# Patient Record
Sex: Female | Born: 1960
Health system: Southern US, Community
[De-identification: ages and names within clinical notes are randomized; demographics above are authoritative.]

## PROBLEM LIST (undated history)

## (undated) DIAGNOSIS — I10 Essential (primary) hypertension: Secondary | ICD-10-CM

## (undated) DIAGNOSIS — Z96652 Presence of left artificial knee joint: Secondary | ICD-10-CM

## (undated) DIAGNOSIS — Z8489 Family history of other specified conditions: Secondary | ICD-10-CM

## (undated) DIAGNOSIS — R011 Cardiac murmur, unspecified: Secondary | ICD-10-CM

## (undated) DIAGNOSIS — E7800 Pure hypercholesterolemia, unspecified: Secondary | ICD-10-CM

## (undated) DIAGNOSIS — Z923 Personal history of irradiation: Secondary | ICD-10-CM

## (undated) DIAGNOSIS — E039 Hypothyroidism, unspecified: Secondary | ICD-10-CM

## (undated) DIAGNOSIS — F419 Anxiety disorder, unspecified: Secondary | ICD-10-CM

## (undated) DIAGNOSIS — C801 Malignant (primary) neoplasm, unspecified: Secondary | ICD-10-CM

## (undated) DIAGNOSIS — M1712 Unilateral primary osteoarthritis, left knee: Secondary | ICD-10-CM

## (undated) DIAGNOSIS — J189 Pneumonia, unspecified organism: Secondary | ICD-10-CM

## (undated) DIAGNOSIS — Z87442 Personal history of urinary calculi: Secondary | ICD-10-CM

## (undated) HISTORY — PX: BONE MARROW TRANSPLANT: SHX200

## (undated) HISTORY — PX: CATARACT EXTRACTION BILATERAL W/ ANTERIOR VITRECTOMY: SHX1304

---
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
## 1988-10-30 HISTORY — PX: ECTOPIC PREGNANCY SURGERY: SHX613

## 1990-10-30 HISTORY — PX: AXILLARY LYMPH NODE DISSECTION: SHX5229

## 2002-08-03 ENCOUNTER — Encounter: Payer: Self-pay | Admitting: Emergency Medicine

## 2002-08-03 ENCOUNTER — Inpatient Hospital Stay (HOSPITAL_COMMUNITY): Admission: EM | Admit: 2002-08-03 | Discharge: 2002-08-07 | Payer: Self-pay | Admitting: Emergency Medicine

## 2002-08-04 ENCOUNTER — Encounter: Payer: Self-pay | Admitting: Internal Medicine

## 2002-08-05 ENCOUNTER — Encounter: Payer: Self-pay | Admitting: Family Medicine

## 2003-07-15 ENCOUNTER — Encounter: Payer: Self-pay | Admitting: Family Medicine

## 2003-07-15 ENCOUNTER — Ambulatory Visit (HOSPITAL_COMMUNITY): Admission: RE | Admit: 2003-07-15 | Discharge: 2003-07-15 | Payer: Self-pay | Admitting: Family Medicine

## 2005-02-20 ENCOUNTER — Emergency Department (HOSPITAL_COMMUNITY): Admission: EM | Admit: 2005-02-20 | Discharge: 2005-02-20 | Payer: Self-pay | Admitting: *Deleted

## 2005-09-13 ENCOUNTER — Ambulatory Visit (HOSPITAL_COMMUNITY): Admission: RE | Admit: 2005-09-13 | Discharge: 2005-09-13 | Payer: Self-pay | Admitting: Internal Medicine

## 2007-11-08 ENCOUNTER — Ambulatory Visit (HOSPITAL_COMMUNITY): Admission: RE | Admit: 2007-11-08 | Discharge: 2007-11-08 | Payer: Self-pay | Admitting: Internal Medicine

## 2007-11-08 ENCOUNTER — Encounter: Payer: Self-pay | Admitting: Orthopedic Surgery

## 2007-11-18 ENCOUNTER — Ambulatory Visit (HOSPITAL_COMMUNITY): Admission: RE | Admit: 2007-11-18 | Discharge: 2007-11-18 | Payer: Self-pay | Admitting: Family Medicine

## 2009-10-06 ENCOUNTER — Emergency Department (HOSPITAL_COMMUNITY): Admission: EM | Admit: 2009-10-06 | Discharge: 2009-10-06 | Payer: Self-pay | Admitting: Emergency Medicine

## 2009-10-31 ENCOUNTER — Emergency Department (HOSPITAL_COMMUNITY): Admission: EM | Admit: 2009-10-31 | Discharge: 2009-10-31 | Payer: Self-pay | Admitting: Emergency Medicine

## 2009-12-08 ENCOUNTER — Ambulatory Visit (HOSPITAL_COMMUNITY): Admission: RE | Admit: 2009-12-08 | Discharge: 2009-12-08 | Payer: Self-pay | Admitting: Family Medicine

## 2009-12-08 ENCOUNTER — Encounter: Payer: Self-pay | Admitting: Orthopedic Surgery

## 2010-03-21 ENCOUNTER — Encounter: Payer: Self-pay | Admitting: Orthopedic Surgery

## 2010-03-30 ENCOUNTER — Ambulatory Visit: Payer: Self-pay | Admitting: Orthopedic Surgery

## 2010-03-30 DIAGNOSIS — M1710 Unilateral primary osteoarthritis, unspecified knee: Secondary | ICD-10-CM

## 2010-10-29 ENCOUNTER — Emergency Department (HOSPITAL_COMMUNITY)
Admission: EM | Admit: 2010-10-29 | Discharge: 2010-10-29 | Payer: Self-pay | Source: Home / Self Care | Admitting: Emergency Medicine

## 2010-11-19 ENCOUNTER — Encounter: Payer: Self-pay | Admitting: Internal Medicine

## 2010-12-01 NOTE — Letter (Signed)
Summary: History form  History form   Imported By: Jacklynn Ganong 04/07/2010 15:10:17  _____________________________________________________________________  External Attachment:    Type:   Image     Comment:   External Document

## 2010-12-01 NOTE — Assessment & Plan Note (Signed)
Summary: EVAL/TREAT RT KNEE PAIN/XRAY RDC/REF FUSCO/BSF   Vital Signs:  Patient profile:   50 year old female Height:      63 inches Weight:      206 pounds Pulse rate:   76 / minute Resp:     16 per minute  Visit Type:  new patient Referring Provider:  Dr. Sherwood Gambler Primary Provider:  Dr. Phillips Odor  CC:  right knee pain.  History of Present Illness:   Xrays [facility] 12/08/09 right knee.  MRI L spine 2009 for review also.  50 years old female presents with "problems RIGHT knee and ".  She reports 3 injuries to the RIGHT knee one secondary to a fall at [facility] years ago and seemed to recover well from that, to twisted her knee stepping off a curb and felt acute pain.  She also reports pain after performing exercises in the Zumba  program.    Meds: Benazepril, Amlodipine, Levothyroxine.  Allergies (verified): 1)  ! 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 Cardiovascular:  Denies chest pain, palpitations, fainting, and murmurs. Respiratory:  Denies short of breath, wheezing, couch, tightness, pain on inspiration, and snoring . Gastrointestinal:  Denies heartburn, nausea, vomiting, diarrhea, constipation, and blood in your stools. Genitourinary:  Denies frequency, urgency, difficulty urinating, painful urination, flank pain, and bleeding in urine. Neurologic:  Denies numbness, tingling, unsteady gait, dizziness, tremors, and seizure. Musculoskeletal:  Denies joint pain, swelling, instability, stiffness, redness, heat, and muscle pain. Endocrine:  Denies excessive thirst, exessive urination, and heat or cold intolerance. Psychiatric:  Denies nervousness,  depression, anxiety, and hallucinations. Skin:  Denies changes in the skin, poor healing, rash, itching, and redness. HEENT:  Complains of blurred or double vision; denies eye pain, redness, and watering. Immunology:  Denies seasonal allergies, sinus problems, and allergic to bee stings. Hemoatologic:  Denies easy bleeding and brusing.  Physical Exam  Skin:  intact without lesions or rashes Cervical Nodes:  no significant adenopathy Psych:  alert and cooperative; normal mood and affect; normal attention span and concentration   Knee Exam  General:    Well-developed, well-nourished, Large body habitus; no deformities, normal grooming.  Gait:    limp noted-right.    Skin:    Intact, no scars, lesions, rashes, cafe au lait spots, or bruising.    Inspection:     No deformity, ecchymosis , but there does appear to be mild joint effusion  Palpation:      LEFT side normaltenderness R-medial joint line.    Vascular:    There was no swelling or varicose veins. The pulses and temperature are normal. There was no edema or tenderness.  Sensory:    Gross coordination and sensation were normal.    Motor:    Motor strength 5/5 bilaterally for quadriceps, hamstrings, ankle dorsiflexion, and ankle plantar flexion.    Reflexes:    Normal and symmetric patellar and Achilles reflexes bilaterally.    Knee Exam:    Right:    Inspection:  Abnormal    Palpation:  Abnormal    Left:    Inspection:  Normal    Palpation:  Normal    chest good range of motion in both knees and both  knees are stable with excellent strength.  She has negative McMurray sign.  She does have a slight loss of flexion in the RIGHT knee maybe 5 normal on the LEFT and full flexion and both with some pain with flexion of the RIGHT knee  Anterior drawer:    Right negative; Left negative Posterior drawer:    Right negative; Left negative   Impression & Recommendations:  Problem # 1:  OSTEOARTHRITIS, KNEE, RIGHT  (ICD-715.96) Assessment New  x-rays are dated December 08, 2009 RTC x-rays show severe wear of the medial compartment with near bone to bone changes  Based on her negative McMurray sign and her x-ray most likely has acute pain from osteoarthritis with possible degenerative meniscal tear  We decided to try injection first if no improvement then reassess the situation for possible scope vs. knee replacement  Verbal consent was obtained. The knee was prepped with alcohol and ethyl chloride. 1 cc of depomedrol 40mg /cc and 4 cc of lidocaine 1% was injected. there were no complications.  Orders: New Patient Level III (54098) Joint Aspirate / Injection, Large (20610) Depo- Medrol 40mg  (J1030)  Patient Instructions: 1)  You have received an injection of cortisone today. You may experience increased pain at the injection site. Apply ice pack to the area for 20 minutes every 2 hours and take 2 xtra strength tylenol every 8 hours. This increased pain will usually resolve in 24 hours. The injection will take effect in 3-10 days.  2)  continue same medications  3)  Please schedule a follow-up appointment as needed.

## 2010-12-07 ENCOUNTER — Encounter (HOSPITAL_COMMUNITY): Payer: 59

## 2010-12-07 ENCOUNTER — Other Ambulatory Visit: Payer: Self-pay | Admitting: Ophthalmology

## 2010-12-07 DIAGNOSIS — Z01812 Encounter for preprocedural laboratory examination: Secondary | ICD-10-CM | POA: Insufficient documentation

## 2010-12-07 DIAGNOSIS — Z01810 Encounter for preprocedural cardiovascular examination: Secondary | ICD-10-CM | POA: Insufficient documentation

## 2010-12-07 LAB — BASIC METABOLIC PANEL
BUN: 18 mg/dL (ref 6–23)
CO2: 26 mEq/L (ref 19–32)
Calcium: 9.6 mg/dL (ref 8.4–10.5)
Chloride: 107 mEq/L (ref 96–112)
GFR calc Af Amer: >60 mL/min (ref >60)
GFR calc non Af Amer: >60 mL/min (ref >60)
Potassium: 4.5 mEq/L (ref 3.5–5.1)
Sodium: 142 mEq/L (ref 135–145)

## 2010-12-12 ENCOUNTER — Ambulatory Visit (HOSPITAL_COMMUNITY)
Admission: RE | Admit: 2010-12-12 | Discharge: 2010-12-12 | Disposition: A | Discharge status: Home / Self Care | Payer: 59 | Source: Ambulatory Visit | Attending: Ophthalmology | Admitting: Ophthalmology

## 2010-12-12 DIAGNOSIS — Z79899 Other long term (current) drug therapy: Secondary | ICD-10-CM | POA: Insufficient documentation

## 2010-12-12 DIAGNOSIS — H2510 Age-related nuclear cataract, unspecified eye: Secondary | ICD-10-CM | POA: Insufficient documentation

## 2010-12-12 DIAGNOSIS — Z01812 Encounter for preprocedural laboratory examination: Secondary | ICD-10-CM | POA: Insufficient documentation

## 2010-12-12 DIAGNOSIS — I10 Essential (primary) hypertension: Secondary | ICD-10-CM | POA: Insufficient documentation

## 2010-12-12 DIAGNOSIS — Z01818 Encounter for other preprocedural examination: Secondary | ICD-10-CM | POA: Insufficient documentation

## 2010-12-12 DIAGNOSIS — Z7982 Long term (current) use of aspirin: Secondary | ICD-10-CM | POA: Insufficient documentation

## 2011-01-15 LAB — STREP A DNA PROBE: Group A Strep Probe: NEGATIVE

## 2011-01-17 ENCOUNTER — Encounter (HOSPITAL_COMMUNITY): Payer: 59

## 2011-01-23 ENCOUNTER — Ambulatory Visit (HOSPITAL_COMMUNITY)
Admission: RE | Admit: 2011-01-23 | Discharge: 2011-01-23 | Disposition: A | Discharge status: Home / Self Care | Payer: 59 | Source: Ambulatory Visit | Attending: Ophthalmology | Admitting: Ophthalmology

## 2011-01-23 DIAGNOSIS — I10 Essential (primary) hypertension: Secondary | ICD-10-CM | POA: Insufficient documentation

## 2011-01-23 DIAGNOSIS — Z79899 Other long term (current) drug therapy: Secondary | ICD-10-CM | POA: Insufficient documentation

## 2011-01-23 DIAGNOSIS — H2510 Age-related nuclear cataract, unspecified eye: Secondary | ICD-10-CM | POA: Insufficient documentation

## 2011-01-23 DIAGNOSIS — Z7982 Long term (current) use of aspirin: Secondary | ICD-10-CM | POA: Insufficient documentation

## 2011-03-17 NOTE — Discharge Summary (Signed)
   [name], [name]                              ACCOUNT NO.:  [id number]   MEDICAL RECORD NO.:  [id number]                   PATIENT TYPE:  INP   LOCATION:  A336                                 FACILITY:  [facility]   PHYSICIAN:  Madelin Rear. Sherwood Gambler, M.D.             DATE OF BIRTH:  [birth year]   DATE OF ADMISSION:  08/03/2002  DATE OF DISCHARGE:  08/07/2002                                 DISCHARGE SUMMARY   DISCHARGE DIAGNOSES:  1. Pneumonia.  2. Hypertension.  3. Hypothyroidism.  4. Chronic mastoiditis.  5. Lymphatic malignancy.   DISCHARGE MEDICATIONS:  1. Levaquin 500 mg p.o. every day  2. Anaplex DM 1 teaspoon q.4h. p.r.n.  3. Duoneb q.i.d.  4. Synthroid 100 mcg per day.  5. Lotrel 20 mg p.o. every day.   HOSPITAL COURSE/SUMMARY:  The patient was admitted with a past medical  history of Hodgkin's lymphoma, bone marrow transplant in 1993 at [facility], hypothyroidism, status post radioactive ablation,  and hypertension.  Her presenting complaints were office presentation with  progressively increasing shortness of breath, acute onset of fever, and  rigors.  She was subsequently admitted with a community acquired pneumonia  documented by chest film and left perihilar infiltrate.  She had a marked  elevation of white count and bandemia suggestive of septicemia.  She  responded well to IV antibiotics, and discharged on the aforementioned  medications for follow up in the office.                                               Madelin Rear. Sherwood Gambler, M.D.    LJF/MEDQ  D:  09/07/2002  T:  09/08/2002  Job:  [id number]

## 2011-03-17 NOTE — H&P (Signed)
NAMEJAMIELYN, PETRUCCI                              ACCOUNT NO.:  [id number]   MEDICAL RECORD NO.:  [id number]                   PATIENT TYPE:  EMS   LOCATION:  ED                                   FACILITY:  [facility]   PHYSICIAN:  Hanley Hays. Dechurch, M.D.           DATE OF BIRTH:  1961-06-09   DATE OF ADMISSION:  08/03/2002  DATE OF DISCHARGE:                                HISTORY & PHYSICAL   HISTORY OF PRESENT ILLNESS:  The patient is a 50 year old white female  followed by the [facility] with a past medical history  remarkable for hyperthyroidism and Hodgkin's lymphoma, status post bone  marrow transplant approximately 10 years ago with no evidence of recurrence,  presents with URI symptoms x 2 days followed by acute onset of fever and  rigors this a.m.  She notes nausea but no shortness of breath.  She does  have a cough with purulent sputum.  No chest pain.  Her fever in the  emergency room was noted to be 103.5.  She has a leukocytosis with a  pronounced bandemia and hemodynamically is stable.  She is being admitted to  the hospital for IV antibiotics.   MEDICATIONS:  1. Synthroid 100 mcg daily.  2. Lotrel 20 mg daily.  3. Tylenol p.r.n.   ALLERGIES:  CODEINE and CEPHALEXIN.  She notes ANYTHING IN THAT FAMILY.   PAST MEDICAL HISTORY:  1. Hodgkin's lymphoma in 1991.  2. Bone marrow transplant in 1993, at [facility].  3. Hyperthyroidism, status post I-131.  4. Hypertension.   SOCIAL HISTORY:  The patient is married.  She has two daughters, one who  lives with her with her three children.  She is not employed outside of the  home.  Previous history of tobacco, none in 10 years.   FAMILY MEDICAL HISTORY:  Noncontributory.   REVIEW OF SYSTEMS:  Usually active.  ENT:  Recurrent sinus problems and  rhinitis.  No cardiovascular history.  History of bronchial asthma in the  past.  Denies reflux, constipation, or change in stools.  GENITOURINARY:   No  complaints.  NEUROLOGIC:  No complaints.  MUSCULOSKELETAL:  No complaints.  HEMATOLOGIC:  Not aware of any anemia or other issues.  No evidence of  recurrence of disease per her oncologist.  PSYCHIATRIC:  Negative.  ENDOCRINE:  Negative.  Takes Synthroid.  SKIN:  No rashes.   PHYSICAL EXAMINATION:  GENERAL:  Obese white female.  VITAL SIGNS:  Blood pressure 115/45, pulse 100, temperature 101.7 after  ibuprofen.  NECK:  Obese.  No nodes.  LUNGS:  Clear anterior and posterior.  Respiratory rate is 16.  ABDOMEN:  Obese, soft, nontender.  EXTREMITIES:  Without clubbing, cyanosis, or edema.  Feet are warm.  Pulses  are intact.  HEART:  Regular.  No murmurs appreciated.  No adenopathy in the groin, axilla, or neck.   LABORATORY  DATA:  Chest x-ray reveals, [facility] film, enlarged heart and what  looks like a left perihilar infiltrate.  No masses are noted.  No old films  are available for comparison, unclear why we do not have a PA and lateral.   ASSESSMENT AND PLAN:  1. Community-acquired pneumonia in a patient with history of bone marrow     transplant who has been doing quite well.  Given her pronounced bandemia     and rigors suspicious for bacteremia, the patient will be treated with IV     antibiotics.  She does have a history of allergy to medications in the     CEPHALEXIN FAMILY.  She did receive a gram of IV Rocephin in the     emergency room and appears to have tolerated it well.  However, will     change to Levaquin, which should cover her atypicals and avoid any     possible reactions with cephalosporins.  2. History of hyperthyroidism:  No recent TSHs are available.  I will defer     this to her primary physician.  Continue Synthroid at 100 mcg daily.  3. History of non-Hodgkin's lymphoma:  No evidence of disease.  The patient     will need a follow-up chest x-ray in four to six weeks to ensure clearing     of the perihilar infiltrate.                                                Hanley Hays Josefine Class, M.D.    FED/MEDQ  D:  08/03/2002  T:  08/03/2002  Job:  [id number]

## 2015-08-13 NOTE — H&P (Signed)
  NTS SOAP Note  Vital Signs:  Vitals as of: 75/91/6384: Systolic 665: Diastolic 76: Heart Rate 993: Temp 98.46F: Height 63ft 4in: Weight 231Lbs 0 Ounces: BMI 39.65  BMI : 39.65 kg/m2  Subjective: This 54 year old female presents for of personal h/o colon polyps.  Had a colonoscopy last year at [facility] and was told she needed follow up colonoscopy due to multpile polyps present.  Mother has colon cancer.  Denies any gi complaints.  Review of Symptoms:  Constitutional:unremarkable   Head:unremarkable Eyes:unremarkable   Nose/Mouth/Throat:unremarkable Cardiovascular:  unremarkable Respiratory:unremarkable Gastrointestinal:  unremarkable   Genitourinary:unremarkable   Musculoskeletal:unremarkable Skin:unremarkable Hodgekin's lymphoma, in remission Allergic/Immunologic:unremarkable   Past Medical History:  Reviewed  Past Medical History  Surgical History: multiple TCS Medical Problems: HTN, hypothyroidism Allergies: codeine, keflex Medications: baby asa, amlodipine, benazepril, levothyroxine   Social History:Reviewed  Social History  Preferred Language: English Race:  White Ethnicity: Not Hispanic / Latino Age: 35 year Marital Status:  M Alcohol: no   Smoking Status: Never smoker reviewed on 08/03/2015 Functional Status reviewed on 08/03/2015 ------------------------------------------------ Bathing: Normal Cooking: Normal Dressing: Normal Driving: Normal Eating: Normal Managing Meds: Normal Oral Care: Normal Shopping: Normal Toileting: Normal Transferring: Normal Walking: Normal Cognitive Status reviewed on 08/03/2015 ------------------------------------------------ Attention: Normal Decision Making: Normal Language: Normal Memory: Normal Motor: Normal Perception: Normal Problem Solving: Normal Visual and Spatial: Normal   Family History:Reviewed  Family Health History Mother, Living; Breast cancer; Colon cancer;  Father      Objective Information: General:Well appearing, well nourished in no distress. Heart:RRR, no murmur or gallop.  Normal S1, S2.  No S3, S4.  Lungs:  CTA bilaterally, no wheezes, rhonchi, rales.  Breathing unlabored. Abdomen:Soft, NT/ND, no HSM, no masses. deferred to procedure  Assessment:Family h/o colon carcinoma, personal h/o colon polyps  Diagnoses: 211.3  K63.5 Polyp of colon (Polyp of colon)  Procedures: 57017 - OFFICE OUTPATIENT NEW 20 MINUTES    Plan:  Will call to schedule TCS.  Trilyte prescribed.   Patient Education:Alternative treatments to surgery were discussed with patient (and family).  Risks and benefits  of procedure were fully explained to the patient (and family) who gave informed consent. Patient/family questions were addressed.  Follow-up:Pending Surgery

## 2015-09-07 ENCOUNTER — Ambulatory Visit (HOSPITAL_COMMUNITY)
Admission: RE | Admit: 2015-09-07 | Discharge: 2015-09-07 | Disposition: A | Discharge status: Home / Self Care | Payer: 59 | Source: Ambulatory Visit | Attending: General Surgery | Admitting: General Surgery

## 2015-09-07 ENCOUNTER — Encounter (HOSPITAL_COMMUNITY)
Admission: RE | Disposition: A | Discharge status: Home / Self Care | Payer: Self-pay | Source: Ambulatory Visit | Attending: General Surgery

## 2015-09-07 ENCOUNTER — Encounter (HOSPITAL_COMMUNITY): Payer: Self-pay

## 2015-09-07 DIAGNOSIS — Z800 Family history of malignant neoplasm of digestive organs: Secondary | ICD-10-CM | POA: Diagnosis not present

## 2015-09-07 DIAGNOSIS — Z1211 Encounter for screening for malignant neoplasm of colon: Secondary | ICD-10-CM | POA: Insufficient documentation

## 2015-09-07 DIAGNOSIS — I10 Essential (primary) hypertension: Secondary | ICD-10-CM | POA: Diagnosis not present

## 2015-09-07 DIAGNOSIS — D124 Benign neoplasm of descending colon: Secondary | ICD-10-CM | POA: Diagnosis not present

## 2015-09-07 DIAGNOSIS — E039 Hypothyroidism, unspecified: Secondary | ICD-10-CM | POA: Insufficient documentation

## 2015-09-07 DIAGNOSIS — D123 Benign neoplasm of transverse colon: Secondary | ICD-10-CM | POA: Insufficient documentation

## 2015-09-07 HISTORY — DX: Essential (primary) hypertension: I10

## 2015-09-07 HISTORY — DX: Hypothyroidism, unspecified: E03.9

## 2015-09-07 HISTORY — PX: COLONOSCOPY: SHX5424

## 2015-09-07 HISTORY — DX: Malignant (primary) neoplasm, unspecified: C80.1

## 2015-09-07 SURGERY — COLONOSCOPY
Anesthesia: Moderate Sedation

## 2015-09-07 MED ORDER — MIDAZOLAM HCL 5 MG/5ML IJ SOLN
INTRAMUSCULAR | Status: AC
  Filled 2015-09-07: qty 5

## 2015-09-07 MED ORDER — MEPERIDINE HCL 50 MG/ML IJ SOLN
INTRAMUSCULAR | Status: DC | PRN
  Administered 2015-09-07: 50 mg
  Administered 2015-09-07: 25 mg

## 2015-09-07 MED ORDER — MEPERIDINE HCL 100 MG/ML IJ SOLN
INTRAMUSCULAR | Status: AC
  Filled 2015-09-07: qty 1

## 2015-09-07 MED ORDER — SODIUM CHLORIDE 0.9 % IV SOLN
INTRAVENOUS | Status: DC
  Administered 2015-09-07: 08:00:00 via INTRAVENOUS

## 2015-09-07 MED ORDER — MIDAZOLAM HCL 5 MG/5ML IJ SOLN
INTRAMUSCULAR | Status: DC | PRN
  Administered 2015-09-07 (×2): 1 mg via INTRAVENOUS
  Administered 2015-09-07: 4 mg via INTRAVENOUS

## 2015-09-07 NOTE — Discharge Instructions (Signed)
Diverticulosis °Diverticulosis is the condition that develops when small pouches (diverticula) form in the wall of your colon. Your colon, or large intestine, is where water is absorbed and stool is formed. The pouches form when the inside layer of your colon pushes through weak spots in the outer layers of your colon. °CAUSES  °No one knows exactly what causes diverticulosis. °RISK FACTORS °· Being older than 50. Your risk for this condition increases with age. Diverticulosis is rare in people younger than 40 years. By age 80, almost everyone has it. °· Eating a low-fiber diet. °· Being frequently constipated. °· Being overweight. °· Not getting enough exercise. °· Smoking. °· Taking over-the-counter pain medicines, like aspirin and ibuprofen. °SYMPTOMS  °Most people with diverticulosis do not have symptoms. °DIAGNOSIS  °Because diverticulosis often has no symptoms, health care providers often discover the condition during an exam for other colon problems. In many cases, a health care provider will diagnose diverticulosis while using a flexible scope to examine the colon (colonoscopy). °TREATMENT  °If you have never developed an infection related to diverticulosis, you may not need treatment. If you have had an infection before, treatment may include: °· Eating more fruits, vegetables, and grains. °· Taking a fiber supplement. °· Taking a live bacteria supplement (probiotic). °· Taking medicine to relax your colon. °HOME CARE INSTRUCTIONS  °· Drink at least 6-8 glasses of water each day to prevent constipation. °· Try not to strain when you have a bowel movement. °· Keep all follow-up appointments. °If you have had an infection before:  °· Increase the fiber in your diet as directed by your health care provider or dietitian. °· Take a dietary fiber supplement if your health care provider approves. °· Only take medicines as directed by your health care provider. °SEEK MEDICAL CARE IF:  °· You have abdominal  pain. °· You have bloating. °· You have cramps. °· You have not gone to the bathroom in 3 days. °SEEK IMMEDIATE MEDICAL CARE IF:  °· Your pain gets worse. °· Your bloating becomes very bad. °· You have a fever or chills, and your symptoms suddenly get worse. °· You begin vomiting. °· You have bowel movements that are bloody or black. °MAKE SURE YOU: °· Understand these instructions. °· Will watch your condition. °· Will get help right away if you are not doing well or get worse. °  °This information is not intended to replace advice given to you by your health care provider. Make sure you discuss any questions you have with your health care provider. °  °Document Released: 07/13/2004 Document Revised: 10/21/2013 Document Reviewed: 09/10/2013 °Elsevier Interactive Patient Education ©2016 Else[employer] Inc[employer] °Colonoscopy, Care After °Refer to this sheet in the next few weeks. These instructions provide you with information on caring for yourself after your procedure. Your health care provider may also give you more specific instructions. Your treatment has been planned according to current medical practices, but problems sometimes occur. Call your health care provider if you have any problems or questions after your procedure. °WHAT TO EXPECT AFTER THE PROCEDURE  °After your procedure, it is typical to have the following: °· A small amount of blood in your stool. °· Moderate amounts of gas and mild abdominal cramping or bloating. °HOME CARE INSTRUCTIONS °· Do not drive, operate machinery, or sign important documents for 24 hours. °· You may shower and resume your regular physical activities, but move at a slower pace for the first 24 hours. °· Take frequent rest periods for the   first 24 hours. °· Walk around or put a warm pack on your abdomen to help reduce abdominal cramping and bloating. °· Drink enough fluids to keep your urine clear or pale yellow. °· You may resume your normal diet as instructed by your health care  provider. Avoid heavy or fried foods that are hard to digest. °· Avoid drinking alcohol for 24 hours or as instructed by your health care provider. °· Only take over-the-counter or prescription medicines as directed by your health care provider. °· If a tissue sample (biopsy) was taken during your procedure: °· Do not take aspirin or blood thinners for 7 days, or as instructed by your health care provider. °· Do not drink alcohol for 7 days, or as instructed by your health care provider. °· Eat soft foods for the first 24 hours. °SEEK MEDICAL CARE IF: °You have persistent spotting of blood in your stool 2-3 days after the procedure. °SEEK IMMEDIATE MEDICAL CARE IF: °· You have more than a small spotting of blood in your stool. °· You pass large blood clots in your stool. °· Your abdomen is swollen (distended). °· You have nausea or vomiting. °· You have a fever. °· You have increasing abdominal pain that is not relieved with medicine. °  °This information is not intended to replace advice given to you by your health care provider. Make sure you discuss any questions you have with your health care provider. °  °Document Released: 05/30/2004 Document Revised: 08/06/2013 Document Reviewed: 06/23/2013 °Elsevier Interactive Patient Education ©2016 Elsevier Inc. ° °

## 2015-09-07 NOTE — Op Note (Signed)
[facility] [address] [place], [address]   COLONOSCOPY PROCEDURE REPORT     EXAM DATE: Sep 08, 2015  PATIENT NAME:      [name], [name]           MR #:      [id number] BIRTHDATE:       1961-08-19      VISIT #:     [phone]  ATTENDING:     Aviva Signs, MD     STATUS:     outpatient ASSISTANT:  INDICATIONS:  The patient is a 54 yr old female here for a colonoscopy due to patient's immediate family history of colon cancer. PROCEDURE PERFORMED:     Colonoscopy with snare polypectomy MEDICATIONS:     Demerol 75 mg IV and Versed 6 mg IV ESTIMATED BLOOD LOSS:     None  CONSENT: The patient understands the risks and benefits of the procedure and understands that these risks include, but are not limited to: sedation, allergic reaction, infection, perforation and/or bleeding. Alternative means of evaluation and treatment include, among others: physical exam, x-rays, and/or surgical intervention. The patient elects to proceed with this endoscopic procedure.  DESCRIPTION OF PROCEDURE: During intra-op preparation period all mechanical & medical equipment was checked for proper function. Hand hygiene and appropriate measures for infection prevention was taken. After the risks, benefits and alternatives of the procedure were thoroughly explained, Informed consent was verified, confirmed and timeout was successfully executed by the treatment team. A digital exam revealed no abnormalities of the rectum. The EC-[id number] ([id number]) endoscope was introduced through the anus and advanced to the cecum, which was identified by both the appendix and ileocecal valve. adequate (Suprep was used) The instrument was then slowly withdrawn as the colon was fully examined.Estimated blood loss is zero unless otherwise noted in this procedure report.   COLON FINDINGS: There was moderate diverticulosis noted in the descending colon.   Three smooth sessile polyps ranging  between 3-23mm in size were found in the proximal transverse colon. Polypectomies were performed.  The resection was complete, the polyp tissue was completely retrieved and sent to histology.   A smooth semi-pedunculated polyp ranging between 3-64mm in size was found in the proximal descending colon.  A polypectomy was performed using snare cautery.  The resection was complete, the polyp tissue was completely retrieved and sent to histology.   The examination was otherwise normal. Retroflexed views revealed no abnormalities. The scope was then completely withdrawn from the patient and the procedure terminated. SCOPE WITHDRAWAL TIME: 11    ADVERSE EVENTS:      There were no immediate complications.  IMPRESSIONS:     1.  Moderate diverticulosis was noted in the descending colon 2.  Three sessile polyps ranging between 3-70mm in size were found in the proximal transverse colon; polypectomies were performed 3.  Semi-pedunculated polyp ranging between 3-98mm in size was found in the proximal descending colon; polypectomy was performed using snare cautery 4.  The examination was otherwise normal  RECOMMENDATIONS:     1.  Await pathology results 2.  Repeat Colonoscopy in 3 years. RECALL:  _____________________________ Aviva Signs, MD eSigned:  Aviva Signs, MD 09-08-15 9:26 AM   cc:   CPT CODES: ICD CODES:  The ICD and CPT codes recommended by this software are interpretations from the data that the clinical staff has captured with the software.  The verification of the translation of this report to the ICD and CPT codes and modifiers is the sole  responsibility of the health care institution and practicing physician where this report was generated.  [employer] will not be held responsible for the validity of the ICD and CPT codes included on this report.  AMA assumes no liability for data contained or not contained herein. CPT is a Designer, television/film set of  the [employer].   PATIENT NAME:  [name], [name] MR#: [id number]

## 2015-09-07 NOTE — Interval H&P Note (Signed)
History and Physical Interval Note:  09/07/2015 8:58 AM  [name]  has presented today for surgery, with the diagnosis of personal history of colon polyps  The various methods of treatment have been discussed with the patient and family. After consideration of risks, benefits and other options for treatment, the patient has consented to  Procedure(s): COLONOSCOPY (N/A) as a surgical intervention .  The patient's history has been reviewed, patient examined, no change in status, stable for surgery.  I have reviewed the patient's chart and labs.  Questions were answered to the patient's satisfaction.     Aviva Signs A

## 2015-09-13 ENCOUNTER — Encounter (HOSPITAL_COMMUNITY): Payer: Self-pay | Admitting: General Surgery

## 2016-10-31 DIAGNOSIS — M2141 Flat foot [pes planus] (acquired), right foot: Secondary | ICD-10-CM | POA: Diagnosis not present

## 2016-10-31 DIAGNOSIS — M1712 Unilateral primary osteoarthritis, left knee: Secondary | ICD-10-CM | POA: Diagnosis not present

## 2016-12-05 DIAGNOSIS — S83242D Other tear of medial meniscus, current injury, left knee, subsequent encounter: Secondary | ICD-10-CM | POA: Diagnosis not present

## 2016-12-05 DIAGNOSIS — M1712 Unilateral primary osteoarthritis, left knee: Secondary | ICD-10-CM | POA: Diagnosis not present

## 2017-01-05 DIAGNOSIS — M1991 Primary osteoarthritis, unspecified site: Secondary | ICD-10-CM | POA: Diagnosis not present

## 2017-01-05 DIAGNOSIS — E890 Postprocedural hypothyroidism: Secondary | ICD-10-CM | POA: Diagnosis not present

## 2017-01-05 DIAGNOSIS — E782 Mixed hyperlipidemia: Secondary | ICD-10-CM | POA: Diagnosis not present

## 2017-01-25 ENCOUNTER — Encounter (HOSPITAL_COMMUNITY): Payer: Self-pay | Admitting: Physician Assistant

## 2017-01-25 DIAGNOSIS — E039 Hypothyroidism, unspecified: Secondary | ICD-10-CM | POA: Diagnosis present

## 2017-01-25 DIAGNOSIS — M1712 Unilateral primary osteoarthritis, left knee: Secondary | ICD-10-CM | POA: Diagnosis present

## 2017-01-25 DIAGNOSIS — C801 Malignant (primary) neoplasm, unspecified: Secondary | ICD-10-CM | POA: Diagnosis present

## 2017-01-25 DIAGNOSIS — I10 Essential (primary) hypertension: Secondary | ICD-10-CM | POA: Diagnosis present

## 2017-01-25 NOTE — H&P (Signed)
TOTAL KNEE ADMISSION H&P  Patient is being admitted for left total knee arthroplasty.  Subjective:  Chief Complaint:left knee pain.  HPI: [name], 56 y.o. female, has a history of pain and functional disability in the left knee due to arthritis and has failed non-surgical conservative treatments for greater than 12 weeks to includeNSAID's and/or analgesics, corticosteriod injections, viscosupplementation injections, flexibility and strengthening excercises, use of assistive devices, weight reduction as appropriate and activity modification.  Onset of symptoms was gradual, starting 10 years ago with gradually worsening course since that time. The patient noted no past surgery on the left knee(s).  Patient currently rates pain in the left knee(s) at 10 out of 10 with activity. Patient has night pain, worsening of pain with activity and weight bearing, pain that interferes with activities of daily living, crepitus and joint swelling.  Patient has evidence of subchondral sclerosis, periarticular osteophytes and joint space narrowing by imaging studies.  There is no active infection.  Patient Active Problem List   Diagnosis Date Noted  . Primary localized osteoarthritis of left knee   . Hypothyroidism   . Hypertension   . Cancer ([employer])   . OSTEOARTHRITIS, KNEE, RIGHT 03/30/2010   Past Medical History:  Diagnosis Date  . Cancer ([place])    hodkins lymphoma, 1992 Diagnosed  . Hypertension   . Hypothyroidism   . Primary localized osteoarthritis of left knee     Past Surgical History:  Procedure Laterality Date  . AXILLARY LYMPH NODE DISSECTION Right 1992  . COLONOSCOPY N/A 09/07/2015   Procedure: COLONOSCOPY;  Surgeon: Aviva Signs, MD;  [facility];  Service: Gastroenterology;  Laterality: N/A;  . ECTOPIC PREGNANCY SURGERY  1990  . TONSILLECTOMY  1965   No current facility-administered medications for this encounter.   Current Outpatient Prescriptions:  .  amLODipine  (NORVASC) 10 MG tablet, Take 10 mg by mouth every evening. Takes Lotrel in the morning and additional amlodipine at night, Disp: , Rfl:  .  aspirin 81 MG tablet, Take 81 mg by mouth daily. Stopped prior to procedure, Disp: , Rfl:  .  benazepril (LOTENSIN) 20 MG tablet, Take 20 mg by mouth daily., Disp: , Rfl:  .  cetirizine (ZYRTEC) 10 MG tablet, Take 10 mg by mouth daily., Disp: , Rfl:  .  CINNAMON PO, Take 2 capsules by mouth daily., Disp: , Rfl:  .  diphenhydrAMINE (BENADRYL) 25 mg capsule, Take 25 mg by mouth at bedtime., Disp: , Rfl:  .  levothyroxine (SYNTHROID, LEVOTHROID) 88 MCG tablet, Take 88 mcg by mouth daily before breakfast., Disp: , Rfl:  .  pravastatin (PRAVACHOL) 20 MG tablet, Take 20 mg by mouth every evening. , Disp: , Rfl:  .  Probiotic Product (PHILLIPS COLON HEALTH PO), Take 1 tablet by mouth daily., Disp: , Rfl:  .  traMADol (ULTRAM) 50 MG tablet, Take 50 mg by mouth daily as needed for moderate pain., Disp: , Rfl:  .  TURMERIC PO, Take 1 tablet by mouth daily., Disp: , Rfl:  Allergies  Allergen Reactions  . Codeine Itching and Nausea Only  . Contrast Media [Iodinated Diagnostic Agents] Nausea And Vomiting    Social History  Substance Use Topics  . Smoking status: Former Smoker    Quit date: 05/08/1990  . Smokeless tobacco: Never Used  . Alcohol use No    Family History  Problem Relation Age of Onset  . Breast cancer Mother   . Diabetes Father      Review of  Systems  Constitutional: Negative.   HENT: Negative.   Eyes: Negative.   Respiratory: Negative.   Cardiovascular: Negative.   Gastrointestinal: Positive for diarrhea.  Genitourinary: Negative.   Musculoskeletal: Positive for back pain and joint pain.  Skin: Negative.   Neurological: Negative.   Endo/Heme/Allergies: Negative.   Psychiatric/Behavioral: Negative.     Objective:  Physical Exam  Constitutional: She is oriented to person, place, and time. She appears well-developed and  well-nourished.  HENT:  Head: Normocephalic and atraumatic.  Mouth/Throat: Oropharynx is clear and moist.  Eyes: Conjunctivae are normal. Pupils are equal, round, and reactive to light.  Neck: Neck supple.  Cardiovascular: Normal rate and regular rhythm.   Respiratory: Effort normal and breath sounds normal.  GI: Soft. Bowel sounds are normal.  Genitourinary:  Genitourinary Comments: Not pertinent to current symptomatology therefore not examined.  Musculoskeletal:  Well-developed, well-nourished white female in no acute distress.  Alert and oriented.  Examination of bilateral knees reveal pain bilaterally.  1+ crepitation.  1+ synovitis.  Range of motion 0-120 degrees.  Knees are stable with normal patella tracking.  Vascular exam: Pulses are 2+ and symmetric.  Neurologic exam: Distal motor and sensory examination is within normal limits.      Neurological: She is alert and oriented to person, place, and time.  Skin: Skin is warm and dry.  Psychiatric: She has a normal mood and affect. Her behavior is normal.    Vital signs in last 24 hours:    Labs:   Estimated body mass index is 37.76 kg/m as calculated from the following:   Height as of 09/07/15: 5\' 4"  (1.626 m).   Weight as of 09/07/15: 99.8 kg (220 lb).   Imaging Review Plain radiographs demonstrate severe degenerative joint disease of the left knee(s). The overall alignment issignificant varus. The bone quality appears to be good for age and reported activity level.  Assessment/Plan:  End stage arthritis, left knee Principal Problem:   Primary localized osteoarthritis of left knee Active Problems:   Hypothyroidism   Hypertension   Cancer ([place])    The patient history, physical examination, clinical judgment of the provider and imaging studies are consistent with end stage degenerative joint disease of the left knee(s) and total knee arthroplasty is deemed medically necessary. The treatment options including medical  management, injection therapy arthroscopy and arthroplasty were discussed at length. The risks and benefits of total knee arthroplasty were presented and reviewed. The risks due to aseptic loosening, infection, stiffness, patella tracking problems, thromboembolic complications and other imponderables were discussed. The patient acknowledged the explanation, agreed to proceed with the plan and consent was signed. Patient is being admitted for inpatient treatment for surgery, pain control, PT, OT, prophylactic antibiotics, VTE prophylaxis, progressive ambulation and ADL's and discharge planning. The patient is planning to be discharged home with home health services

## 2017-01-26 ENCOUNTER — Encounter (HOSPITAL_COMMUNITY)
Admission: RE | Admit: 2017-01-26 | Discharge: 2017-01-26 | Disposition: A | Discharge status: Home / Self Care | Payer: 59 | Source: Ambulatory Visit | Attending: Orthopedic Surgery | Admitting: Orthopedic Surgery

## 2017-01-26 ENCOUNTER — Encounter (HOSPITAL_COMMUNITY): Payer: Self-pay

## 2017-01-26 DIAGNOSIS — Z01810 Encounter for preprocedural cardiovascular examination: Secondary | ICD-10-CM | POA: Diagnosis present

## 2017-01-26 DIAGNOSIS — Z01812 Encounter for preprocedural laboratory examination: Secondary | ICD-10-CM | POA: Insufficient documentation

## 2017-01-26 DIAGNOSIS — I10 Essential (primary) hypertension: Secondary | ICD-10-CM | POA: Insufficient documentation

## 2017-01-26 HISTORY — DX: Anxiety disorder, unspecified: F41.9

## 2017-01-26 LAB — COMPREHENSIVE METABOLIC PANEL
ALBUMIN: 4 g/dL (ref 3.5–5.0)
ALT: 26 U/L (ref 14–54)
AST: 23 U/L (ref 15–41)
Alkaline Phosphatase: 51 U/L (ref 38–126)
Anion gap: 10 (ref 5–15)
BUN: 14 mg/dL (ref 6–20)
CHLORIDE: 104 mmol/L (ref 101–111)
CO2: 25 mmol/L (ref 22–32)
CREATININE: 0.78 mg/dL (ref 0.44–1.00)
Calcium: 9.1 mg/dL (ref 8.9–10.3)
GFR calc non Af Amer: >60 mL/min (ref >60)
GLUCOSE: 116 mg/dL — AB (ref 65–99)
Potassium: 3.4 mmol/L — ABNORMAL LOW (ref 3.5–5.1)
SODIUM: 139 mmol/L (ref 135–145)
Total Bilirubin: 0.4 mg/dL (ref 0.3–1.2)
Total Protein: 7.2 g/dL (ref 6.5–8.1)

## 2017-01-26 LAB — CBC WITH DIFFERENTIAL/PLATELET
BASOS ABS: 0 10*3/uL (ref 0.0–0.1)
Basophils Relative: 0 %
EOS ABS: 0.1 10*3/uL (ref 0.0–0.7)
Eosinophils Relative: 1 %
HCT: 41.1 % (ref 36.0–46.0)
HEMOGLOBIN: 13.7 g/dL (ref 12.0–15.0)
Lymphocytes Relative: 20 %
Lymphs Abs: 2.2 10*3/uL (ref 0.7–4.0)
MCH: 32.5 pg (ref 26.0–34.0)
MCHC: 33.3 g/dL (ref 30.0–36.0)
MCV: 97.4 fL (ref 78.0–100.0)
Monocytes Absolute: 0.8 10*3/uL (ref 0.1–1.0)
Monocytes Relative: 7 %
NEUTROS ABS: 7.9 10*3/uL — AB (ref 1.7–7.7)
Neutrophils Relative %: 72 %
Platelets: 216 10*3/uL (ref 150–400)
RBC: 4.22 MIL/uL (ref 3.87–5.11)
RDW: 14.4 % (ref 11.5–15.5)
WBC: 11.1 10*3/uL — AB (ref 4.0–10.5)

## 2017-01-26 LAB — TYPE AND SCREEN
ABO/RH(D): A POS
Antibody Screen: NEGATIVE

## 2017-01-26 LAB — ABO/RH: ABO/RH(D): A POS

## 2017-01-26 LAB — SURGICAL PCR SCREEN
MRSA, PCR: NEGATIVE
STAPHYLOCOCCUS AUREUS: NEGATIVE

## 2017-01-26 NOTE — Progress Notes (Signed)
PCP: Sharilyn Sites  No cardiologist or cardiac workup per pt.  EKG: 01/26/17

## 2017-01-26 NOTE — Pre-Procedure Instructions (Signed)
[name]  01/26/2017      [facility] - [place], [place] - [id number] [place] [address] Phone: [phone] Fax: [phone]    Your procedure is scheduled on Monday April 9.  Report to [facility] Admitting at 5:30 A.M.  Call this number if you have problems the morning of surgery:  [phone]   Remember:  Do not eat food or drink liquids after midnight.  Take these medicines the morning of surgery with A SIP OF WATER: amlodipine (norvasc), cetirizine (zyrtec), levothyroxine (synthroid), tramadol (ultram) if needed  7 days prior to surgery STOP taking any Aspirin, Aleve, Naproxen, Ibuprofen, Motrin, Advil, Goody's, BC's, all herbal medications, fish oil, and all vitamins    Do not wear jewelry, make-up or nail polish.  Do not wear lotions, powders, or perfumes, or deoderant.  Do not shave 48 hours prior to surgery.  Men may shave face and neck.  Do not bring valuables to the hospital.  [facility] is not responsible for any belongings or valuables.  Contacts, dentures or bridgework may not be worn into surgery.  Leave your suitcase in the car.  After surgery it may be brought to your room.  For patients admitted to the hospital, discharge time will be determined by your treatment team.  Patients discharged the day of surgery will not be allowed to drive home.    Special instructions:    [place]- Preparing For Surgery  Before surgery, you can play an important role. Because skin is not sterile, your skin needs to be as free of germs as possible. You can reduce the number of germs on your skin by washing with CHG (chlorahexidine gluconate) Soap before surgery.  CHG is an antiseptic cleaner which kills germs and bonds with the skin to continue killing germs even after washing.  Please do not use if you have an allergy to CHG or antibacterial soaps. If your skin becomes reddened/irritated stop using the CHG.  Do  not shave (including legs and underarms) for at least 48 hours prior to first CHG shower. It is OK to shave your face.  Please follow these instructions carefully.   1. Shower the NIGHT BEFORE SURGERY and the MORNING OF SURGERY with CHG.   2. If you chose to wash your hair, wash your hair first as usual with your normal shampoo.  3. After you shampoo, rinse your hair and body thoroughly to remove the shampoo.  4. Use CHG as you would any other liquid soap. You can apply CHG directly to the skin and wash gently with a scrungie or a clean washcloth.   5. Apply the CHG Soap to your body ONLY FROM THE NECK DOWN.  Do not use on open wounds or open sores. Avoid contact with your eyes, ears, mouth and genitals (private parts). Wash genitals (private parts) with your normal soap.  6. Wash thoroughly, paying special attention to the area where your surgery will be performed.  7. Thoroughly rinse your body with warm water from the neck down.  8. DO NOT shower/wash with your normal soap after using and rinsing off the CHG Soap.  9. Pat yourself dry with a CLEAN TOWEL.   10. Wear CLEAN PAJAMAS   11. Place CLEAN SHEETS on your bed the night of your first shower and DO NOT SLEEP WITH PETS.    Day of Surgery: Do not apply any deodorants/lotions. Please wear clean clothes to  the hospital/surgery center.      Please read over the following fact sheets that you were given. Total Joint Packet and MRSA Information

## 2017-01-27 LAB — URINE CULTURE

## 2017-01-29 DIAGNOSIS — M1712 Unilateral primary osteoarthritis, left knee: Secondary | ICD-10-CM | POA: Diagnosis not present

## 2017-01-30 ENCOUNTER — Encounter: Payer: Self-pay | Admitting: Cardiovascular Disease

## 2017-01-30 NOTE — Progress Notes (Signed)
Cardiology Office Note   Date:  01/31/2017   ID:  [name], DOB [birth year], MRN [id number]  PCP:  Purvis Kilts, MD  Cardiologist:   Jenkins Rouge, MD   No chief complaint on file.     History of Present Illness: [name] is a 56 y.o. female who presents for evaluation/consultation preoperative cardiac risk Referred by Dr Noemi Chapel. Patient needs total  Left knee replacement Tentatively scheduled for 02/05/17 She has HTN , elevated lipids , hypothyroidism and history of nodular lymphcyte Hogdkin Lymphoma. She is followed for this at [facility] . In early 90's had mantle radiation and chemoRx. Underwent autologous PBSCT 11/27/91 Does not appear to have  Had any recent echocardiogram's   She injured knee last April dancing at nephew's wedding doing "Cupid Shuffle" and injured it again Doing same thing this year. Mild exertional dyspnea no chest pain   Past Medical History:  Diagnosis Date  . Anxiety   . Cancer ([place])    hodgkins lymphoma, 1992 Diagnosed  . Hypertension   . Hypothyroidism    "thyroid is dead"  . Primary localized osteoarthritis of left knee     Past Surgical History:  Procedure Laterality Date  . AXILLARY LYMPH NODE DISSECTION Right 1992  . COLONOSCOPY N/A 09/07/2015   Procedure: COLONOSCOPY;  Surgeon: Aviva Signs, MD;  [facility];  Service: Gastroenterology;  Laterality: N/A;  . ECTOPIC PREGNANCY SURGERY  1990  . TONSILLECTOMY  1965     Current Outpatient Prescriptions  Medication Sig Dispense Refill  . amLODipine (NORVASC) 10 MG tablet Take 10 mg by mouth every evening.     Marland Kitchen aspirin 81 MG tablet Take 81 mg by mouth daily.     . benazepril (LOTENSIN) 20 MG tablet Take 20 mg by mouth daily.    . cetirizine (ZYRTEC) 10 MG tablet Take 10 mg by mouth daily.    Marland Kitchen CINNAMON PO Take 2 capsules by mouth daily.    . diphenhydrAMINE (BENADRYL) 25 mg capsule Take 25 mg by mouth at bedtime.    Marland Kitchen levothyroxine (SYNTHROID, LEVOTHROID) 88 MCG  tablet Take 88 mcg by mouth daily before breakfast.    . pravastatin (PRAVACHOL) 20 MG tablet Take 20 mg by mouth every evening.     . Probiotic Product (PHILLIPS COLON HEALTH PO) Take 1 tablet by mouth daily.    . traMADol (ULTRAM) 50 MG tablet Take 50 mg by mouth daily as needed for moderate pain.    . TURMERIC PO Take 1 tablet by mouth daily.     No current facility-administered medications for this visit.     Allergies:   Cephalexin; Iodine-131; Codeine; and Contrast media [iodinated diagnostic agents]    Social History:  The patient  reports that she quit smoking about 26 years ago. She has never used smokeless tobacco. She reports that she does not drink alcohol or use drugs.   Family History:  The patient's family history includes Breast cancer in her mother; Diabetes in her father.    ROS:  Please see the history of present illness.   Otherwise, review of systems are positive for none.   All other systems are reviewed and negative.    PHYSICAL EXAM: VS:  BP 128/80 (BP Location: Left Arm, Cuff Size: Large)   Pulse 93   Ht 5\' 3"  (1.6 m)   Wt 225 lb 6.4 oz (102.2 kg)   SpO2 97%   BMI 39.93 kg/m  , BMI Body  mass index is 39.93 kg/m. Affect appropriate Overweight white female  HEENT: normal Neck supple with no adenopathy JVP normal no bruits no thyromegaly Lungs clear with no wheezing and good diaphragmatic motion Heart:  S1/S2 SEM murmur, no rub, gallop or click PMI normal Abdomen: benighn, BS positve, no tenderness, no AAA no bruit.  No HSM or HJR Distal pulses intact with no bruits No edema Neuro non-focal Skin warm and dry Limited ROM left knee     EKG:  3/301/8 SR rate 94 nonspecific ST/T wave changes    Recent Labs: 01/26/2017: ALT 26; BUN 14; Creatinine, Ser 0.78; Hemoglobin 13.7; Platelets 216; Potassium 3.4; Sodium 139    Lipid Panel No results found for: CHOL, TRIG, HDL, CHOLHDL, VLDL, LDLCALC, LDLDIRECT    Wt Readings from Last 3 Encounters:    01/31/17 225 lb 6.4 oz (102.2 kg)  01/26/17 223 lb 12.8 oz (101.5 kg)  09/07/15 220 lb (99.8 kg)      Other studies Reviewed: Additional studies/ records that were reviewed today include: [facility] and Dr Noemi Chapel orthopedics ECG .    ASSESSMENT AND PLAN:  1. Preoperative Evaluation previous mantle radiation with abnormal ECG and exertional dyspnea With moderate risk orthopedic surgery f/u Lexiscan myovue  2. Hodgkin Lymphoma in remission f/u oncology  3. HTN Well controlled.  Continue current medications and low sodium Dash type diet.   4. Cholesterol on statin labs with primary  5. Thyroid on replacement TSH normal 6. Murmur: with history of mantle radiation likely has calcification of aortic valve. Murmur is not Severe likely AV sclerosis or mild stenosis f/u echo   Will clear for left TKR in May so long as myovue and echo are ok    Current medicines are reviewed at length with the patient today.  The patient does not have concerns regarding medicines.  The following changes have been made:  no change  Labs/ tests ordered today include: Echo Lexiscan Myovue   Orders Placed This Encounter  Procedures  . Myocardial Perfusion Imaging  . ECHOCARDIOGRAM COMPLETE     Disposition:   FU with me in a year      Signed, Jenkins Rouge, MD  01/31/2017 11:07 AM    [facility] [address], [place], [place]  [address] Phone: [phone]; Fax: [phone]

## 2017-01-31 ENCOUNTER — Other Ambulatory Visit: Payer: Self-pay

## 2017-01-31 ENCOUNTER — Encounter (INDEPENDENT_AMBULATORY_CARE_PROVIDER_SITE_OTHER): Payer: Self-pay

## 2017-01-31 ENCOUNTER — Ambulatory Visit (INDEPENDENT_AMBULATORY_CARE_PROVIDER_SITE_OTHER): Payer: 59 | Admitting: Cardiovascular Disease

## 2017-01-31 ENCOUNTER — Encounter: Payer: Self-pay | Admitting: Cardiovascular Disease

## 2017-01-31 VITALS — BP 128/80 | HR 93 | Ht 63.0 in | Wt 225.4 lb

## 2017-01-31 DIAGNOSIS — R011 Cardiac murmur, unspecified: Secondary | ICD-10-CM | POA: Diagnosis not present

## 2017-01-31 DIAGNOSIS — R9431 Abnormal electrocardiogram [ECG] [EKG]: Secondary | ICD-10-CM

## 2017-01-31 DIAGNOSIS — Z01818 Encounter for other preprocedural examination: Secondary | ICD-10-CM | POA: Diagnosis not present

## 2017-01-31 NOTE — Patient Instructions (Addendum)
Medication Instructions:  Your physician recommends that you continue on your current medications as directed. Please refer to the Current Medication list given to you today.  Labwork: NONE  Testing/Procedures: Your physician has requested that you have an echocardiogram. Echocardiography is a painless test that uses sound waves to create images of your heart. It provides your doctor with information about the size and shape of your heart and how well your heart's chambers and valves are working. This procedure takes approximately one hour. There are no restrictions for this procedure.  Your physician has requested that you have a lexiscan myoview. For further information please visit [email or web address]. Please follow instruction sheet, as given.  Follow-Up: Your physician wants you to follow-up in: 12 months with Dr. Nishan. You will receive a reminder letter in the mail two months in advance. If you don't receive a letter, please call our office to schedule the follow-up appointment.   If you need a refill on your cardiac medications before your next appointment, please call your pharmacy.    

## 2017-02-12 ENCOUNTER — Ambulatory Visit: Payer: 59 | Admitting: Physician Assistant

## 2017-02-12 ENCOUNTER — Telehealth (HOSPITAL_COMMUNITY): Payer: Self-pay | Admitting: *Deleted

## 2017-02-12 NOTE — Telephone Encounter (Signed)
Attempted to call patient regarding upcoming appointment- no answer.  [name]  

## 2017-02-13 ENCOUNTER — Telehealth (HOSPITAL_COMMUNITY): Payer: Self-pay | Admitting: *Deleted

## 2017-02-13 NOTE — Telephone Encounter (Signed)
Patient given detailed instructions per Myocardial Perfusion Study Information Sheet for the test on 02/15/17 at 0715. Patient notified to arrive 15 minutes early and that it is imperative to arrive on time for appointment to keep from having the test rescheduled.  If you need to cancel or reschedule your appointment, please call the office within 24 hours of your appointment. Failure to do so may result in a cancellation of your appointment, and a $50 no show fee. Patient verbalized understanding.Athina Fahey, Ranae Palms

## 2017-02-15 ENCOUNTER — Ambulatory Visit (HOSPITAL_COMMUNITY): Payer: 59 | Attending: Cardiology

## 2017-02-15 ENCOUNTER — Other Ambulatory Visit: Payer: Self-pay

## 2017-02-15 ENCOUNTER — Ambulatory Visit (HOSPITAL_BASED_OUTPATIENT_CLINIC_OR_DEPARTMENT_OTHER): Payer: 59

## 2017-02-15 DIAGNOSIS — R011 Cardiac murmur, unspecified: Secondary | ICD-10-CM | POA: Diagnosis not present

## 2017-02-15 DIAGNOSIS — R9431 Abnormal electrocardiogram [ECG] [EKG]: Secondary | ICD-10-CM | POA: Insufficient documentation

## 2017-02-15 DIAGNOSIS — Z01818 Encounter for other preprocedural examination: Secondary | ICD-10-CM | POA: Diagnosis not present

## 2017-02-15 DIAGNOSIS — Z01810 Encounter for preprocedural cardiovascular examination: Secondary | ICD-10-CM | POA: Diagnosis not present

## 2017-02-15 DIAGNOSIS — I10 Essential (primary) hypertension: Secondary | ICD-10-CM | POA: Insufficient documentation

## 2017-02-15 LAB — ECHOCARDIOGRAM COMPLETE
E/e' ratio: 8.86
EWDT: 243 ms
FS: 27 % — AB (ref 28–44)
IVS/LV PW RATIO, ED: 0.98
LA ID, A-P, ES: 29 mm
LA diam end sys: 29 mm
LA diam index: 1.33 cm/m2
LA vol A4C: 39 ml
LA vol index: 21.5 mL/m2
LAVOL: 47 mL
LV E/e'average: 8.86
LV PW d: 12.7 mm — AB (ref 0.6–1.1)
LVEEMED: 8.86
LVELAT: 10.7 cm/s
LVOT area: 3.46 cm2
LVOTD: 21 mm
MV Dec: 243
MVPG: 4 mmHg
MVPKAVEL: 90.3 m/s
MVPKEVEL: 94.8 m/s
RV LATERAL S' VELOCITY: 14.4 cm/s
TDI e' lateral: 10.7
TDI e' medial: 8.77

## 2017-02-15 LAB — MYOCARDIAL PERFUSION IMAGING
LV sys vol: 32 mL
LVDIAVOL: 83 mL (ref 46–106)
NUC STRESS TID: 0.85
Peak HR: 115 {beats}/min
RATE: 0.29
Rest HR: 85 {beats}/min
SDS: 2
SRS: 6
SSS: 8

## 2017-02-15 MED ORDER — TECHNETIUM TC 99M TETROFOSMIN IV KIT
32.9000 | PACK | Freq: Once | INTRAVENOUS | Status: AC | PRN
  Administered 2017-02-15: 32.9 via INTRAVENOUS
  Filled 2017-02-15: qty 33

## 2017-02-15 MED ORDER — REGADENOSON 0.4 MG/5ML IV SOLN
0.4000 mg | Freq: Once | INTRAVENOUS | Status: AC
  Administered 2017-02-15: 0.4 mg via INTRAVENOUS

## 2017-02-15 MED ORDER — TECHNETIUM TC 99M TETROFOSMIN IV KIT
11.0000 | PACK | Freq: Once | INTRAVENOUS | Status: AC | PRN
  Administered 2017-02-15: 11 via INTRAVENOUS
  Filled 2017-02-15: qty 11

## 2017-02-15 MED ORDER — PERFLUTREN LIPID MICROSPHERE
1.0000 mL | INTRAVENOUS | Status: AC | PRN
  Administered 2017-02-15: 2 mL via INTRAVENOUS

## 2017-02-28 DIAGNOSIS — M1712 Unilateral primary osteoarthritis, left knee: Secondary | ICD-10-CM | POA: Diagnosis not present

## 2017-02-28 NOTE — H&P (Signed)
TOTAL KNEE ADMISSION H&P  Patient is being admitted for left total knee arthroplasty.  Subjective:  Chief Complaint:left knee pain.  HPI: [name], 56 y.o. female, has a history of pain and functional disability in the left knee due to arthritis and has failed non-surgical conservative treatments for greater than 12 weeks to includeNSAID's and/or analgesics, corticosteriod injections, viscosupplementation injections, flexibility and strengthening excercises, supervised PT with diminished ADL's post treatment, weight reduction as appropriate and activity modification.  Onset of symptoms was gradual, starting 10 years ago with gradually worsening course since that time. The patient noted no past surgery on the left knee(s).  Patient currently rates pain in the left knee(s) at 10 out of 10 with activity. Patient has night pain, worsening of pain with activity and weight bearing, pain that interferes with activities of daily living, crepitus and joint swelling.  Patient has evidence of subchondral sclerosis, periarticular osteophytes and joint space narrowing by imaging studies.  There is no active infection.  Patient Active Problem List   Diagnosis Date Noted  . Primary localized osteoarthritis of left knee   . Hypothyroidism   . Hypertension   . Cancer ([place])   . OSTEOARTHRITIS, KNEE, RIGHT 03/30/2010   Past Medical History:  Diagnosis Date  . Anxiety   . Cancer ([place])    hodgkins lymphoma, 1992 Diagnosed  . Hypertension   . Hypothyroidism    "thyroid is dead"  . Primary localized osteoarthritis of left knee     Past Surgical History:  Procedure Laterality Date  . AXILLARY LYMPH NODE DISSECTION Right 1992  . COLONOSCOPY N/A 09/07/2015   Procedure: COLONOSCOPY;  Surgeon: Aviva Signs, MD;  [facility];  Service: Gastroenterology;  Laterality: N/A;  . ECTOPIC PREGNANCY SURGERY  1990  . TONSILLECTOMY  1965    No current facility-administered medications for this encounter.    Current Outpatient Prescriptions:  .  amLODipine (NORVASC) 10 MG tablet, Take 10 mg by mouth every evening. , Disp: , Rfl:  .  aspirin 81 MG tablet, Take 81 mg by mouth daily. , Disp: , Rfl:  .  benazepril (LOTENSIN) 20 MG tablet, Take 20 mg by mouth daily., Disp: , Rfl:  .  cetirizine (ZYRTEC) 10 MG tablet, Take 10 mg by mouth daily., Disp: , Rfl:  .  CINNAMON PO, Take 2 capsules by mouth daily., Disp: , Rfl:  .  diphenhydrAMINE (BENADRYL) 25 mg capsule, Take 25 mg by mouth at bedtime., Disp: , Rfl:  .  levothyroxine (SYNTHROID, LEVOTHROID) 88 MCG tablet, Take 88 mcg by mouth daily before breakfast., Disp: , Rfl:  .  pravastatin (PRAVACHOL) 20 MG tablet, Take 20 mg by mouth every evening. , Disp: , Rfl:  .  Probiotic Product (PHILLIPS COLON HEALTH PO), Take 1 tablet by mouth daily., Disp: , Rfl:  .  traMADol (ULTRAM) 50 MG tablet, Take 50 mg by mouth daily as needed for moderate pain., Disp: , Rfl:  .  TURMERIC PO, Take 1 tablet by mouth daily., Disp: , Rfl:    Allergies  Allergen Reactions  . Cephalexin Rash  . Iodine-131 Nausea And Vomiting  . Codeine Itching and Nausea Only  . Contrast Media [Iodinated Diagnostic Agents] Nausea And Vomiting    Social History  Substance Use Topics  . Smoking status: Former Smoker    Quit date: 05/08/1990  . Smokeless tobacco: Never Used  . Alcohol use No    Family History  Problem Relation Age of Onset  . Breast cancer  Mother   . Diabetes Father      Review of Systems  Constitutional: Negative.   HENT: Negative.   Eyes: Negative.   Respiratory: Negative.   Cardiovascular: Negative.   Gastrointestinal: Negative.   Genitourinary: Negative.   Musculoskeletal: Positive for back pain and joint pain.  Skin: Negative.   Neurological: Negative.   Endo/Heme/Allergies: Negative.   Psychiatric/Behavioral: Negative.     Objective:  Physical Exam  Constitutional: She is oriented to person, place, and time. She appears well-developed and  well-nourished.  HENT:  Head: Normocephalic and atraumatic.  Mouth/Throat: Oropharynx is clear and moist.  Eyes: Conjunctivae are normal. Pupils are equal, round, and reactive to light.  Neck: Normal range of motion. Neck supple.  Cardiovascular: Normal rate and regular rhythm.   Murmur heard. Respiratory: Effort normal.  GI: Soft.  Genitourinary:  Genitourinary Comments: Not pertinent to current symptomatology therefore not examined.  Musculoskeletal:   Evaluation of both knees show active range of motion -5 to 120 degrees.  Varus deformity.  2+ crepitus.  2+ synovitis.  Medial and lateral joint line tenderness.  Distal neurovascular exam is intact.    Neurological: She is alert and oriented to person, place, and time.  Skin: Skin is warm and dry.  Psychiatric: She has a normal mood and affect. Her behavior is normal.    Vital signs in last 24 hours: Temp:  [98.6 F (37 C)] 98.6 F (37 C) (05/02 1500) Pulse Rate:  [105] 105 (05/02 1500) BP: (140)/(81) 140/81 (05/02 1500) SpO2:  [97 %] 97 % (05/02 1500) Weight:  [104.8 kg (231 lb)] 104.8 kg (231 lb) (05/02 1500)  Labs:   Estimated body mass index is 40.92 kg/m as calculated from the following:   Height as of this encounter: 5\' 3"  (1.6 m).   Weight as of this encounter: 104.8 kg (231 lb).   Imaging Review Plain radiographs demonstrate severe degenerative joint disease of the left knee(s). The overall alignment issignificant varus. The bone quality appears to be good for age and reported activity level.  Assessment/Plan:  End stage arthritis, left knee   The patient history, physical examination, clinical judgment of the provider and imaging studies are consistent with end stage degenerative joint disease of the left knee(s) and total knee arthroplasty is deemed medically necessary. The treatment options including medical management, injection therapy arthroscopy and arthroplasty were discussed at length. The risks and  benefits of total knee arthroplasty were presented and reviewed. The risks due to aseptic loosening, infection, stiffness, patella tracking problems, thromboembolic complications and other imponderables were discussed. The patient acknowledged the explanation, agreed to proceed with the plan and consent was signed. Patient is being admitted for inpatient treatment for surgery, pain control, PT, OT, prophylactic antibiotics, VTE prophylaxis, progressive ambulation and ADL's and discharge planning. The patient is planning to be discharged home with home health services

## 2017-03-02 ENCOUNTER — Other Ambulatory Visit (HOSPITAL_COMMUNITY): Payer: Self-pay

## 2017-03-02 ENCOUNTER — Encounter (HOSPITAL_COMMUNITY)
Admission: RE | Admit: 2017-03-02 | Discharge: 2017-03-02 | Disposition: A | Discharge status: Home / Self Care | Payer: 59 | Source: Ambulatory Visit | Attending: Orthopedic Surgery | Admitting: Orthopedic Surgery

## 2017-03-02 DIAGNOSIS — M1712 Unilateral primary osteoarthritis, left knee: Secondary | ICD-10-CM | POA: Diagnosis not present

## 2017-03-02 LAB — COMPREHENSIVE METABOLIC PANEL
ALBUMIN: 4.1 g/dL (ref 3.5–5.0)
ALK PHOS: 56 U/L (ref 38–126)
ALT: 35 U/L (ref 14–54)
ANION GAP: 10 (ref 5–15)
AST: 32 U/L (ref 15–41)
BUN: 20 mg/dL (ref 6–20)
CALCIUM: 9.3 mg/dL (ref 8.9–10.3)
CO2: 27 mmol/L (ref 22–32)
Chloride: 101 mmol/L (ref 101–111)
Creatinine, Ser: 0.75 mg/dL (ref 0.44–1.00)
GFR calc Af Amer: >60 mL/min (ref >60)
GFR calc non Af Amer: >60 mL/min (ref >60)
GLUCOSE: 111 mg/dL — AB (ref 65–99)
Potassium: 3.6 mmol/L (ref 3.5–5.1)
SODIUM: 138 mmol/L (ref 135–145)
Total Bilirubin: 0.5 mg/dL (ref 0.3–1.2)
Total Protein: 7.6 g/dL (ref 6.5–8.1)

## 2017-03-02 LAB — CBC WITH DIFFERENTIAL/PLATELET
BASOS PCT: 1 %
Basophils Absolute: 0.1 10*3/uL (ref 0.0–0.1)
EOS ABS: 0.1 10*3/uL (ref 0.0–0.7)
Eosinophils Relative: 1 %
HCT: 40.9 % (ref 36.0–46.0)
HEMOGLOBIN: 13.3 g/dL (ref 12.0–15.0)
Lymphocytes Relative: 25 %
Lymphs Abs: 2.3 10*3/uL (ref 0.7–4.0)
MCH: 31.5 pg (ref 26.0–34.0)
MCHC: 32.5 g/dL (ref 30.0–36.0)
MCV: 96.9 fL (ref 78.0–100.0)
Monocytes Absolute: 0.7 10*3/uL (ref 0.1–1.0)
Monocytes Relative: 7 %
NEUTROS PCT: 66 %
Neutro Abs: 6 10*3/uL (ref 1.7–7.7)
Platelets: 259 10*3/uL (ref 150–400)
RBC: 4.22 MIL/uL (ref 3.87–5.11)
RDW: 13.7 % (ref 11.5–15.5)
WBC: 9.1 10*3/uL (ref 4.0–10.5)

## 2017-03-02 LAB — PROTIME-INR
INR: 0.96
Prothrombin Time: 12.8 seconds (ref 11.4–15.2)

## 2017-03-02 LAB — SURGICAL PCR SCREEN
MRSA, PCR: NEGATIVE
Staphylococcus aureus: NEGATIVE

## 2017-03-02 LAB — TYPE AND SCREEN
ABO/RH(D): A POS
ANTIBODY SCREEN: NEGATIVE

## 2017-03-02 LAB — APTT: aPTT: 29 seconds (ref 24–36)

## 2017-03-02 NOTE — Progress Notes (Signed)
HR 102.  Pt denies chest pain, sob.  She reports she is anxious and "have a lot going on right now"

## 2017-03-02 NOTE — Pre-Procedure Instructions (Signed)
    [name]  03/02/2017      [facility] [place], [place] - [address] [place] [address] Phone: [phone] Fax: [phone]    Your procedure is scheduled on May 14.  Report to [facility] Admitting at 5:30 A.M.  Call this number if you have problems the morning of surgery:  [phone]   Remember:  Do not eat food or drink liquids after midnight.  Take these medicines the morning of surgery with A SIP OF WATER :amLODipine (NORVASC), cetirizine (ZYRTEC), levothyroxine (SYNTHROID,  LEVOTHROID), traMADol (ULTRAM) if needed   STOP aspirin, products containing aspirin, herbal medications, vitamins, advil, aleve, ibuprofen 7 days prior to surgery May 7   Do not wear jewelry, make-up or nail polish.  Do not wear lotions, powders, or perfumes, or deoderant.  Do not shave 48 hours prior to surgery.  Men may shave face and neck.  Do not bring valuables to the hospital.  [facility] is not responsible for any belongings or valuables.  Contacts, dentures or bridgework may not be worn into surgery.  Leave your suitcase in the car.  After surgery it may be brought to your room.  For patients admitted to the hospital, discharge time will be determined by your treatment team.  Patients discharged the day of surgery will not be allowed to drive home.   Name and phone number of your driver:    Special instructions:  Preparing for surgery  Please read over the following fact sheets that you were given. Pain Booklet, Total Joint Packet and Surgical Site Infection Prevention

## 2017-03-03 LAB — URINE CULTURE

## 2017-03-05 NOTE — Progress Notes (Signed)
Anesthesia Chart Review:  Pt is a 56 year old female scheduled for L total knee arthroplasty on 03/12/2017 with Elsie Saas, MD.   - PCP is Sharilyn Sites, MD - Saw cardiologist Jenkins Rouge, MD for pre-op eval 01/31/17; stress test and echo ordered, results below.   PMH includes:  HTN, hypothyroidism, Hodgkin's lymphoma. Former smoker. BMI 41.  Medications include: Amlodipine, ASA 81 mg, benazepril, levothyroxine, pravastatin.  Preoperative labs reviewed.  EKG 01/26/17: NSR. Nonspecific ST abnormality  Nuclear stress test 02/15/17:   Nuclear stress EF: 62%.  There was < 24mm of J point depression with horizontal ST segment depression  There is a medium sized defect of mild severity present in the basal anterior, mid anterior and apical anterior location. The defect is non-reversible and likely secondary to breast attenuation artifact. No ischemia noted.  This is a low risk study.  The left ventricular ejection fraction is normal (55-65%).  Echo 02/15/17:  - Left ventricle: The cavity size was normal. There was mild concentric hypertrophy. Systolic function was normal. The estimated ejection fraction was in the range of 60% to 65%. Wall motion was normal; there were no regional wall motion abnormalities. There was no evidence of elevated ventricular filling pressure by Doppler parameters. - Mitral valve: Mildly thickened leaflets . There was mild regurgitation.  If no changes, I anticipate pt can proceed with surgery as scheduled.   Willeen Cass, FNP-BC [facility]/Anesthesiology Phone: [phone] 03/05/2017 3:41 PM

## 2017-03-09 MED ORDER — TRANEXAMIC ACID 1000 MG/10ML IV SOLN
1000.0000 mg | INTRAVENOUS | Status: AC
  Administered 2017-03-12: 1000 mg via INTRAVENOUS
  Filled 2017-03-09: qty 10

## 2017-03-09 MED ORDER — VANCOMYCIN HCL 10 G IV SOLR
1500.0000 mg | INTRAVENOUS | Status: AC
  Administered 2017-03-12: 1500 mg via INTRAVENOUS
  Filled 2017-03-09: qty 1500

## 2017-03-09 MED ORDER — LACTATED RINGERS IV SOLN
INTRAVENOUS | Status: DC
  Administered 2017-03-12 (×2): via INTRAVENOUS

## 2017-03-12 ENCOUNTER — Inpatient Hospital Stay (HOSPITAL_COMMUNITY): Payer: 59 | Admitting: Emergency Medicine

## 2017-03-12 ENCOUNTER — Encounter (HOSPITAL_COMMUNITY): Payer: Self-pay | Admitting: Urology

## 2017-03-12 ENCOUNTER — Inpatient Hospital Stay (HOSPITAL_COMMUNITY): Payer: 59 | Admitting: Anesthesiology

## 2017-03-12 ENCOUNTER — Inpatient Hospital Stay (HOSPITAL_COMMUNITY)
Admission: RE | Admit: 2017-03-12 | Discharge: 2017-03-14 | DRG: 470 | Disposition: A | Discharge status: Home Health | Payer: 59 | Source: Ambulatory Visit | Attending: Orthopedic Surgery | Admitting: Orthopedic Surgery

## 2017-03-12 ENCOUNTER — Encounter (HOSPITAL_COMMUNITY)
Admission: RE | Disposition: A | Discharge status: Home Health | Payer: Self-pay | Source: Ambulatory Visit | Attending: Orthopedic Surgery

## 2017-03-12 DIAGNOSIS — Z833 Family history of diabetes mellitus: Secondary | ICD-10-CM | POA: Diagnosis not present

## 2017-03-12 DIAGNOSIS — Z8571 Personal history of Hodgkin lymphoma: Secondary | ICD-10-CM

## 2017-03-12 DIAGNOSIS — Z7982 Long term (current) use of aspirin: Secondary | ICD-10-CM

## 2017-03-12 DIAGNOSIS — M1712 Unilateral primary osteoarthritis, left knee: Secondary | ICD-10-CM | POA: Diagnosis not present

## 2017-03-12 DIAGNOSIS — Z87891 Personal history of nicotine dependence: Secondary | ICD-10-CM | POA: Diagnosis not present

## 2017-03-12 DIAGNOSIS — I10 Essential (primary) hypertension: Secondary | ICD-10-CM | POA: Diagnosis not present

## 2017-03-12 DIAGNOSIS — E039 Hypothyroidism, unspecified: Secondary | ICD-10-CM | POA: Diagnosis not present

## 2017-03-12 DIAGNOSIS — Z6841 Body Mass Index (BMI) 40.0 and over, adult: Secondary | ICD-10-CM | POA: Diagnosis not present

## 2017-03-12 DIAGNOSIS — M25562 Pain in left knee: Secondary | ICD-10-CM | POA: Diagnosis not present

## 2017-03-12 DIAGNOSIS — F419 Anxiety disorder, unspecified: Secondary | ICD-10-CM | POA: Diagnosis present

## 2017-03-12 DIAGNOSIS — C801 Malignant (primary) neoplasm, unspecified: Secondary | ICD-10-CM | POA: Diagnosis present

## 2017-03-12 HISTORY — DX: Unilateral primary osteoarthritis, left knee: M17.12

## 2017-03-12 HISTORY — PX: TOTAL KNEE ARTHROPLASTY: SHX125

## 2017-03-12 LAB — GLUCOSE, CAPILLARY: Glucose-Capillary: 133 mg/dL — ABNORMAL HIGH (ref 65–99)

## 2017-03-12 SURGERY — ARTHROPLASTY, KNEE, TOTAL
Anesthesia: Spinal | Site: Knee | Laterality: Left

## 2017-03-12 MED ORDER — ROCURONIUM BROMIDE 10 MG/ML (PF) SYRINGE
PREFILLED_SYRINGE | INTRAVENOUS | Status: AC
  Filled 2017-03-12: qty 5

## 2017-03-12 MED ORDER — ACETAMINOPHEN 650 MG RE SUPP: 650.0000 mg | Freq: Four times a day (QID) | RECTAL | Status: DC | PRN

## 2017-03-12 MED ORDER — FENTANYL CITRATE (PF) 100 MCG/2ML IJ SOLN
INTRAMUSCULAR | Status: DC | PRN
  Administered 2017-03-12 (×3): 50 ug via INTRAVENOUS

## 2017-03-12 MED ORDER — PROPOFOL 500 MG/50ML IV EMUL
INTRAVENOUS | Status: DC | PRN
  Administered 2017-03-12: 50 ug/kg/min via INTRAVENOUS
  Administered 2017-03-12: 65 ug/kg/min via INTRAVENOUS

## 2017-03-12 MED ORDER — BUPIVACAINE-EPINEPHRINE 0.25% -1:200000 IJ SOLN
INTRAMUSCULAR | Status: DC | PRN
  Administered 2017-03-12: 30 mL

## 2017-03-12 MED ORDER — HYDROMORPHONE HCL 1 MG/ML IJ SOLN
INTRAMUSCULAR | Status: AC
  Filled 2017-03-12: qty 0.5

## 2017-03-12 MED ORDER — HYDROMORPHONE HCL 1 MG/ML IJ SOLN
0.2500 mg | INTRAMUSCULAR | Status: DC | PRN
  Administered 2017-03-12 (×2): 0.5 mg via INTRAVENOUS

## 2017-03-12 MED ORDER — CHLORHEXIDINE GLUCONATE 4 % EX LIQD: 60.0000 mL | Freq: Once | CUTANEOUS | Status: DC

## 2017-03-12 MED ORDER — VANCOMYCIN HCL IN DEXTROSE 1-5 GM/200ML-% IV SOLN
1000.0000 mg | Freq: Two times a day (BID) | INTRAVENOUS | Status: AC
  Administered 2017-03-12: 1000 mg via INTRAVENOUS
  Filled 2017-03-12: qty 200

## 2017-03-12 MED ORDER — EPINEPHRINE PF 1 MG/ML IJ SOLN
INTRAMUSCULAR | Status: AC
  Filled 2017-03-12: qty 1

## 2017-03-12 MED ORDER — METOCLOPRAMIDE HCL 5 MG/ML IJ SOLN: 5.0000 mg | Freq: Three times a day (TID) | INTRAMUSCULAR | Status: DC | PRN

## 2017-03-12 MED ORDER — LORATADINE 10 MG PO TABS
10.0000 mg | ORAL_TABLET | Freq: Every day | ORAL | Status: DC
  Administered 2017-03-13 – 2017-03-14 (×2): 10 mg via ORAL
  Filled 2017-03-12 (×2): qty 1

## 2017-03-12 MED ORDER — DIPHENHYDRAMINE HCL 25 MG PO CAPS
25.0000 mg | ORAL_CAPSULE | Freq: Every day | ORAL | Status: DC
  Administered 2017-03-12 – 2017-03-13 (×2): 25 mg via ORAL
  Filled 2017-03-12 (×2): qty 1

## 2017-03-12 MED ORDER — ASPIRIN EC 325 MG PO TBEC
325.0000 mg | DELAYED_RELEASE_TABLET | Freq: Every day | ORAL | Status: DC
  Administered 2017-03-13 – 2017-03-14 (×2): 325 mg via ORAL
  Filled 2017-03-12 (×2): qty 1

## 2017-03-12 MED ORDER — POVIDONE-IODINE 7.5 % EX SOLN
Freq: Once | CUTANEOUS | Status: DC
  Filled 2017-03-12: qty 118

## 2017-03-12 MED ORDER — DIPHENHYDRAMINE HCL 12.5 MG/5ML PO ELIX: 12.5000 mg | ORAL_SOLUTION | ORAL | Status: DC | PRN

## 2017-03-12 MED ORDER — FENTANYL CITRATE (PF) 250 MCG/5ML IJ SOLN
INTRAMUSCULAR | Status: AC
  Filled 2017-03-12: qty 5

## 2017-03-12 MED ORDER — DEXAMETHASONE SODIUM PHOSPHATE 10 MG/ML IJ SOLN
INTRAMUSCULAR | Status: DC | PRN
  Administered 2017-03-12: 10 mg via INTRAVENOUS

## 2017-03-12 MED ORDER — MENTHOL 3 MG MT LOZG: 1.0000 | LOZENGE | OROMUCOSAL | Status: DC | PRN

## 2017-03-12 MED ORDER — PHENYLEPHRINE HCL 10 MG/ML IJ SOLN
INTRAVENOUS | Status: DC | PRN
  Administered 2017-03-12: 40 ug/min via INTRAVENOUS

## 2017-03-12 MED ORDER — LEVOTHYROXINE SODIUM 88 MCG PO TABS
88.0000 ug | ORAL_TABLET | Freq: Every day | ORAL | Status: DC
  Administered 2017-03-13 – 2017-03-14 (×2): 88 ug via ORAL
  Filled 2017-03-12 (×2): qty 1

## 2017-03-12 MED ORDER — BUPIVACAINE IN DEXTROSE 0.75-8.25 % IT SOLN
INTRATHECAL | Status: DC | PRN
  Administered 2017-03-12: 2 mL via INTRATHECAL

## 2017-03-12 MED ORDER — LIDOCAINE HCL (CARDIAC) 20 MG/ML IV SOLN
INTRAVENOUS | Status: DC | PRN
  Administered 2017-03-12: 40 mg via INTRAVENOUS

## 2017-03-12 MED ORDER — SUCCINYLCHOLINE CHLORIDE 200 MG/10ML IV SOSY
PREFILLED_SYRINGE | INTRAVENOUS | Status: AC
  Filled 2017-03-12: qty 10

## 2017-03-12 MED ORDER — PRAVASTATIN SODIUM 20 MG PO TABS
20.0000 mg | ORAL_TABLET | Freq: Every evening | ORAL | Status: DC
  Administered 2017-03-12 – 2017-03-13 (×2): 20 mg via ORAL
  Filled 2017-03-12 (×2): qty 1

## 2017-03-12 MED ORDER — BUPIVACAINE HCL (PF) 0.25 % IJ SOLN
INTRAMUSCULAR | Status: AC
  Filled 2017-03-12: qty 30

## 2017-03-12 MED ORDER — BENAZEPRIL HCL 20 MG PO TABS
20.0000 mg | ORAL_TABLET | Freq: Every day | ORAL | Status: DC
  Administered 2017-03-14: 20 mg via ORAL
  Filled 2017-03-12: qty 1

## 2017-03-12 MED ORDER — AMLODIPINE BESYLATE 10 MG PO TABS
10.0000 mg | ORAL_TABLET | Freq: Every evening | ORAL | Status: DC
  Administered 2017-03-13: 10 mg via ORAL
  Filled 2017-03-12: qty 1

## 2017-03-12 MED ORDER — HYDROMORPHONE HCL 2 MG PO TABS
2.0000 mg | ORAL_TABLET | ORAL | Status: DC | PRN
  Administered 2017-03-12 – 2017-03-14 (×11): 4 mg via ORAL
  Filled 2017-03-12 (×11): qty 2

## 2017-03-12 MED ORDER — PROPOFOL 10 MG/ML IV BOLUS
INTRAVENOUS | Status: AC
  Filled 2017-03-12: qty 20

## 2017-03-12 MED ORDER — KETOROLAC TROMETHAMINE 30 MG/ML IJ SOLN
30.0000 mg | Freq: Once | INTRAMUSCULAR | Status: DC | PRN
  Administered 2017-03-12: 30 mg via INTRAVENOUS

## 2017-03-12 MED ORDER — DEXAMETHASONE SODIUM PHOSPHATE 10 MG/ML IJ SOLN
10.0000 mg | Freq: Three times a day (TID) | INTRAMUSCULAR | Status: AC
  Administered 2017-03-12 – 2017-03-13 (×4): 10 mg via INTRAVENOUS
  Filled 2017-03-12 (×4): qty 1

## 2017-03-12 MED ORDER — DOCUSATE SODIUM 100 MG PO CAPS
100.0000 mg | ORAL_CAPSULE | Freq: Two times a day (BID) | ORAL | Status: DC
Start: 2017-03-12 — End: 2017-03-14
  Administered 2017-03-12 – 2017-03-14 (×4): 100 mg via ORAL
  Filled 2017-03-12 (×4): qty 1

## 2017-03-12 MED ORDER — ONDANSETRON HCL 4 MG PO TABS: 4.0000 mg | ORAL_TABLET | Freq: Four times a day (QID) | ORAL | Status: DC | PRN

## 2017-03-12 MED ORDER — ALUM & MAG HYDROXIDE-SIMETH 200-200-20 MG/5ML PO SUSP: 30.0000 mL | ORAL | Status: DC | PRN

## 2017-03-12 MED ORDER — EPHEDRINE 5 MG/ML INJ
INTRAVENOUS | Status: AC
  Filled 2017-03-12: qty 10

## 2017-03-12 MED ORDER — 0.9 % SODIUM CHLORIDE (POUR BTL) OPTIME
TOPICAL | Status: DC | PRN
  Administered 2017-03-12: 1000 mL

## 2017-03-12 MED ORDER — MORPHINE SULFATE (PF) 4 MG/ML IV SOLN
2.0000 mg | INTRAVENOUS | Status: DC | PRN
  Administered 2017-03-12: 2 mg via INTRAVENOUS
  Administered 2017-03-12 (×2): 4 mg via INTRAVENOUS
  Filled 2017-03-12 (×3): qty 1

## 2017-03-12 MED ORDER — ONDANSETRON HCL 4 MG/2ML IJ SOLN
INTRAMUSCULAR | Status: DC | PRN
  Administered 2017-03-12: 4 mg via INTRAVENOUS

## 2017-03-12 MED ORDER — PHILLIPS COLON HEALTH PO CAPS: 1.0000 | ORAL_CAPSULE | Freq: Every day | ORAL | Status: DC

## 2017-03-12 MED ORDER — SODIUM CHLORIDE 0.9 % IR SOLN
Status: DC | PRN
  Administered 2017-03-12: 3000 mL

## 2017-03-12 MED ORDER — ACETAMINOPHEN 325 MG PO TABS
650.0000 mg | ORAL_TABLET | Freq: Four times a day (QID) | ORAL | Status: DC | PRN
  Administered 2017-03-13 – 2017-03-14 (×2): 650 mg via ORAL
  Filled 2017-03-12 (×2): qty 2

## 2017-03-12 MED ORDER — MIDAZOLAM HCL 2 MG/2ML IJ SOLN
INTRAMUSCULAR | Status: AC
  Filled 2017-03-12: qty 2

## 2017-03-12 MED ORDER — PHENYLEPHRINE 40 MCG/ML (10ML) SYRINGE FOR IV PUSH (FOR BLOOD PRESSURE SUPPORT)
PREFILLED_SYRINGE | INTRAVENOUS | Status: AC
  Filled 2017-03-12: qty 10

## 2017-03-12 MED ORDER — PHENOL 1.4 % MT LIQD: 1.0000 | OROMUCOSAL | Status: DC | PRN

## 2017-03-12 MED ORDER — ONDANSETRON HCL 4 MG/2ML IJ SOLN
INTRAMUSCULAR | Status: AC
  Filled 2017-03-12: qty 2

## 2017-03-12 MED ORDER — MIDAZOLAM HCL 5 MG/5ML IJ SOLN
INTRAMUSCULAR | Status: DC | PRN
  Administered 2017-03-12 (×2): 1 mg via INTRAVENOUS

## 2017-03-12 MED ORDER — KETOROLAC TROMETHAMINE 30 MG/ML IJ SOLN
INTRAMUSCULAR | Status: AC
  Filled 2017-03-12: qty 1

## 2017-03-12 MED ORDER — ONDANSETRON HCL 4 MG/2ML IJ SOLN: 4.0000 mg | Freq: Four times a day (QID) | INTRAMUSCULAR | Status: DC | PRN

## 2017-03-12 MED ORDER — MEPERIDINE HCL 25 MG/ML IJ SOLN: 6.2500 mg | INTRAMUSCULAR | Status: DC | PRN

## 2017-03-12 MED ORDER — LIDOCAINE 2% (20 MG/ML) 5 ML SYRINGE
INTRAMUSCULAR | Status: AC
  Filled 2017-03-12: qty 5

## 2017-03-12 MED ORDER — POTASSIUM CHLORIDE IN NACL 20-0.9 MEQ/L-% IV SOLN: INTRAVENOUS | Status: DC

## 2017-03-12 MED ORDER — PROMETHAZINE HCL 25 MG/ML IJ SOLN: 6.2500 mg | INTRAMUSCULAR | Status: DC | PRN

## 2017-03-12 MED ORDER — PROPOFOL 10 MG/ML IV BOLUS
INTRAVENOUS | Status: DC | PRN
  Administered 2017-03-12 (×3): 20 mg via INTRAVENOUS

## 2017-03-12 MED ORDER — ACETAMINOPHEN 500 MG PO TABS
1000.0000 mg | ORAL_TABLET | Freq: Four times a day (QID) | ORAL | Status: AC
  Administered 2017-03-12 – 2017-03-13 (×3): 1000 mg via ORAL
  Filled 2017-03-12 (×3): qty 2

## 2017-03-12 MED ORDER — METOCLOPRAMIDE HCL 5 MG PO TABS: 5.0000 mg | ORAL_TABLET | Freq: Three times a day (TID) | ORAL | Status: DC | PRN

## 2017-03-12 MED ORDER — POLYETHYLENE GLYCOL 3350 17 G PO PACK
17.0000 g | PACK | Freq: Two times a day (BID) | ORAL | Status: DC
  Administered 2017-03-13: 17 g via ORAL
  Filled 2017-03-12 (×3): qty 1

## 2017-03-12 SURGICAL SUPPLY — 70 items
BANDAGE ESMARK 6X9 LF (GAUZE/BANDAGES/DRESSINGS) ×1 IMPLANT
BENZOIN TINCTURE PRP APPL 2/3 (GAUZE/BANDAGES/DRESSINGS) ×3 IMPLANT
BLADE SAGITTAL 25.0X1.19X90 (BLADE) ×2 IMPLANT
BLADE SAGITTAL 25.0X1.19X90MM (BLADE) ×1
BLADE SAW SGTL 13X75X1.27 (BLADE) ×3 IMPLANT
BLADE SURG 10 STRL SS (BLADE) ×6 IMPLANT
BNDG ELASTIC 6X15 VLCR STRL LF (GAUZE/BANDAGES/DRESSINGS) ×3 IMPLANT
BNDG ESMARK 6X9 LF (GAUZE/BANDAGES/DRESSINGS) ×3
BOWL SMART MIX CTS (DISPOSABLE) ×3 IMPLANT
CAPT KNEE TOTAL 3 ATTUNE ×3 IMPLANT
CEMENT HV SMART SET (Cement) ×6 IMPLANT
CLOSURE WOUND 1/2 X4 (GAUZE/BANDAGES/DRESSINGS) ×1
COVER SURGICAL LIGHT HANDLE (MISCELLANEOUS) ×3 IMPLANT
CUFF TOURNIQUET SINGLE 34IN LL (TOURNIQUET CUFF) ×3 IMPLANT
CUFF TOURNIQUET SINGLE 44IN (TOURNIQUET CUFF) IMPLANT
DECANTER SPIKE VIAL GLASS SM (MISCELLANEOUS) IMPLANT
DRAPE EXTREMITY T 121X128X90 (DRAPE) ×3 IMPLANT
DRAPE HALF SHEET 40X57 (DRAPES) ×6 IMPLANT
DRAPE INCISE IOBAN 66X45 STRL (DRAPES) IMPLANT
DRAPE U-SHAPE 47X51 STRL (DRAPES) ×3 IMPLANT
DRSG AQUACEL AG ADV 3.5X14 (GAUZE/BANDAGES/DRESSINGS) ×3 IMPLANT
DURAPREP 26ML APPLICATOR (WOUND CARE) ×3 IMPLANT
ELECT CAUTERY BLADE 6.4 (BLADE) ×3 IMPLANT
ELECT REM PT RETURN 9FT ADLT (ELECTROSURGICAL) ×3
ELECTRODE REM PT RTRN 9FT ADLT (ELECTROSURGICAL) ×1 IMPLANT
FACESHIELD WRAPAROUND (MASK) ×6 IMPLANT
GLOVE BIO SURGEON STRL SZ7 (GLOVE) ×3 IMPLANT
GLOVE BIOGEL PI IND STRL 7.0 (GLOVE) ×1 IMPLANT
GLOVE BIOGEL PI IND STRL 7.5 (GLOVE) ×1 IMPLANT
GLOVE BIOGEL PI INDICATOR 7.0 (GLOVE) ×2
GLOVE BIOGEL PI INDICATOR 7.5 (GLOVE) ×2
GLOVE SS BIOGEL STRL SZ 7.5 (GLOVE) ×1 IMPLANT
GLOVE SUPERSENSE BIOGEL SZ 7.5 (GLOVE) ×2
GOWN STRL REUS W/ TWL LRG LVL3 (GOWN DISPOSABLE) ×1 IMPLANT
GOWN STRL REUS W/ TWL XL LVL3 (GOWN DISPOSABLE) ×2 IMPLANT
GOWN STRL REUS W/TWL LRG LVL3 (GOWN DISPOSABLE) ×2
GOWN STRL REUS W/TWL XL LVL3 (GOWN DISPOSABLE) ×4
HANDPIECE INTERPULSE COAX TIP (DISPOSABLE) ×2
HOOD PEEL AWAY FACE SHEILD DIS (HOOD) ×6 IMPLANT
IMMOBILIZER KNEE 22 UNIV (SOFTGOODS) ×3 IMPLANT
KIT BASIN OR (CUSTOM PROCEDURE TRAY) ×3 IMPLANT
KIT ROOM TURNOVER OR (KITS) ×3 IMPLANT
MANIFOLD NEPTUNE II (INSTRUMENTS) ×3 IMPLANT
MARKER SKIN DUAL TIP RULER LAB (MISCELLANEOUS) ×3 IMPLANT
NEEDLE 18GX1X1/2 (RX/OR ONLY) (NEEDLE) ×3 IMPLANT
NEEDLE FILTER BLUNT 18X 1/2SAF (NEEDLE) ×2
NEEDLE FILTER BLUNT 18X1 1/2 (NEEDLE) ×1 IMPLANT
NS IRRIG 1000ML POUR BTL (IV SOLUTION) ×3 IMPLANT
PACK TOTAL JOINT (CUSTOM PROCEDURE TRAY) ×3 IMPLANT
PAD ARMBOARD 7.5X6 YLW CONV (MISCELLANEOUS) ×3 IMPLANT
SET HNDPC FAN SPRY TIP SCT (DISPOSABLE) ×1 IMPLANT
STRIP CLOSURE SKIN 1/2X4 (GAUZE/BANDAGES/DRESSINGS) ×2 IMPLANT
SUCTION FRAZIER HANDLE 10FR (MISCELLANEOUS) ×2
SUCTION TUBE FRAZIER 10FR DISP (MISCELLANEOUS) ×1 IMPLANT
SUT MNCRL AB 3-0 PS2 18 (SUTURE) ×3 IMPLANT
SUT VIC AB 0 CT1 27 (SUTURE) ×4
SUT VIC AB 0 CT1 27XBRD ANBCTR (SUTURE) ×2 IMPLANT
SUT VIC AB 1 CT1 27 (SUTURE) ×2
SUT VIC AB 1 CT1 27XBRD ANBCTR (SUTURE) ×1 IMPLANT
SUT VIC AB 2-0 CT1 27 (SUTURE) ×4
SUT VIC AB 2-0 CT1 TAPERPNT 27 (SUTURE) ×2 IMPLANT
SYR 30ML LL (SYRINGE) ×3 IMPLANT
SYR TB 1ML LUER SLIP (SYRINGE) ×3 IMPLANT
TOWEL OR 17X24 6PK STRL BLUE (TOWEL DISPOSABLE) ×3 IMPLANT
TOWEL OR 17X26 10 PK STRL BLUE (TOWEL DISPOSABLE) ×3 IMPLANT
TRAY CATH 16FR W/PLASTIC CATH (SET/KITS/TRAYS/PACK) IMPLANT
TRAY FOLEY CATH SILVER 16FR (SET/KITS/TRAYS/PACK) ×3 IMPLANT
TUBE CONNECTING 12'X1/4 (SUCTIONS) ×1
TUBE CONNECTING 12X1/4 (SUCTIONS) ×2 IMPLANT
YANKAUER SUCT BULB TIP NO VENT (SUCTIONS) ×3 IMPLANT

## 2017-03-12 NOTE — Anesthesia Procedure Notes (Signed)
Spinal  Patient location during procedure: OR Start time: 03/12/2017 7:22 AM End time: 03/12/2017 7:25 AM Staffing Anesthesiologist: Lyn Hollingshead Performed: anesthesiologist  Preanesthetic Checklist Completed: patient identified, surgical consent, pre-op evaluation, timeout performed, IV checked, risks and benefits discussed and monitors and equipment checked Spinal Block Patient position: sitting Prep: site prepped and draped and DuraPrep Patient monitoring: heart rate, cardiac monitor, continuous pulse ox and blood pressure Approach: midline Location: L3-4 Injection technique: single-shot Needle Needle type: Pencan  Needle gauge: 24 G Needle length: 9 cm Needle insertion depth: 6 cm Assessment Sensory level: T8

## 2017-03-12 NOTE — Care Management Note (Signed)
Case Management Note  Patient Details  Name: [name] MRN: [id number] Date of Birth: [birth year]  Subjective/Objective:                 Patient admitted w L total Knee by Dr [name]. Patient states she aware of [facility] through [facility], LM with [name] of [facility] at home for referral. Patient states that she does not have her DME. LM with [name] from [employer] for DME listed below. [phone]   Action/Plan: DME Equipment 3-in-1 Bedside Commode    CPM    Walker   DME Status Ordered/company contacted   [place]) (Primary Contact: [name] [phone])       Expected Discharge Date:                  Expected Discharge Plan:  [place]  In-House Referral:     Discharge planning Services  CM Consult  Post Acute Care Choice:  Durable Medical Equipment, Home Health Choice offered to:     DME Arranged:    DME Agency:     HH Arranged:    [place] Agency:     Status of Service:  In process, will continue to follow  If discussed at Long Length of Stay Meetings, dates discussed:    Additional Comments:  Allison Collet, RN 03/12/2017, 11:14 AM

## 2017-03-12 NOTE — Anesthesia Procedure Notes (Deleted)
Anesthesia Regional Block: Narrative:       

## 2017-03-12 NOTE — Op Note (Signed)
MRN:     [id number] DOB/AGE:    1960-12-30 / 56 y.o.       OPERATIVE REPORT    DATE OF PROCEDURE:  03/12/2017       PREOPERATIVE DIAGNOSIS:   PRIMARY LOCALIZED OA LEFT TKNEE      Estimated body mass index is 40.92 kg/m as calculated from the following:   Height as of this encounter: 5\' 3"  (1.6 m).   Weight as of this encounter: 104.8 kg (231 lb).                                                        POSTOPERATIVE DIAGNOSIS:   SAME                                                                     PROCEDURE:  Procedure(s): LEFT TOTAL KNEE ARTHROPLASTY Using Depuy Attune RP implants #5 Femur, #5Tibia, 71mm  RP bearing, 29 Patella     SURGEON: [name]    ASSISTANT:  [name]   (Present and scrubbed throughout the case, critical for assistance with exposure, retraction, instrumentation, and closure.)         ANESTHESIA: Spinal with Adductor Nerve Block     TOURNIQUET TIME: 50PTW   COMPLICATIONS:  None     SPECIMENS: None   INDICATIONS FOR PROCEDURE: The patient has  DJD LEFT TKNEE, varus deformities, XR shows bone on bone arthritis. Patient has failed all conservative measures including anti-inflammatory medicines, narcotics, attempts at  exercise and weight loss, cortisone injections and viscosupplementation.  Risks and benefits of surgery have been discussed, questions answered.   DESCRIPTION OF PROCEDURE: The patient identified by armband, received  right femoral nerve block and IV antibiotics, in the holding area at [facility]. Patient taken to the operating room, appropriate anesthetic  monitors were attached Spinal anesthesia induced with  the patient in supine position, Foley catheter was inserted. Tourniquet  applied high to the operative thigh. Lateral post and foot positioner  applied to the table, the lower extremity was then prepped and draped  in usual sterile fashion from the ankle to the tourniquet. Time-out procedure was performed. The  limb was wrapped with an Esmarch bandage and the tourniquet inflated to 365 mmHg. We began the operation by making the anterior midline incision starting at handbreadth above the patella going over the patella 1 cm medial to and  4 cm distal to the tibial tubercle. Small bleeders in the skin and the  subcutaneous tissue identified and cauterized. Transverse retinaculum was incised and reflected medially and Farmer medial parapatellar arthrotomy was accomplished. the patella was everted and theprepatellar fat pad resected. The superficial medial collateral  ligament was then elevated from anterior to posterior along the proximal  flare of the tibia and anterior half of the menisci resected. The knee was hyperflexed exposing bone on bone arthritis. Peripheral and notch osteophytes as well as the cruciate ligaments were then resected. We continued to  work our way around posteriorly along the proximal tibia, and externally  rotated the tibia subluxing it out  from underneath the femur. Farmer McHale  retractor was placed through the notch and Farmer lateral Hohmann retractor  placed, and we then drilled through the proximal tibia in line with the  axis of the tibia followed by an intramedullary guide rod and 2-degree  posterior slope cutting guide. The tibial cutting guide was pinned into place  allowing resection of 4 mm of bone medially and about 6 mm of bone  laterally because of her varus deformity. Satisfied with the tibial resection, we then  entered the distal femur 2 mm anterior to the PCL origin with the  intramedullary guide rod and applied the distal femoral cutting guide  set at 29mm, with 5 degrees of valgus. This was pinned along the  epicondylar axis. At this point, the distal femoral cut was accomplished without difficulty. We then sized for Farmer #5 femoral component and pinned the guide in 3 degrees of external rotation.The chamfer cutting guide was pinned into place. The anterior, posterior, and chamfer  cuts were accomplished without difficulty followed by  the  RP box cutting guide and the box cut. We also removed posterior osteophytes from the posterior femoral condyles. At this  time, the knee was brought into full extension. We checked our  extension and flexion gaps and found them symmetric at 53mm.  The patella thickness measured at 25 mm. We set the cutting guide at 15 and removed the posterior 9.5-10 mm  of the patella sized for 29 button and drilled the lollipop. The knee  was then once again hyperflexed exposing the proximal tibia. We sized for Farmer #5 tibial base plate, applied the smokestack and the conical reamer followed by the the Delta fin keel punch. We then hammered into place the  RP trial femoral component, inserted Farmer 1 trial bearing, trial patellar button, and took the knee through range of motion from 0-130 degrees. No thumb pressure was required for patellar  tracking. At this point, all trial components were removed, Farmer double batch of DePuy HV cement was mixed and applied to all bony metallic mating surfaces except for the posterior condyles of the femur itself. In order, we  hammered into place the tibial tray and removed excess cement, the femoral component and removed excess cement, Farmer 50mm  RP bearing  was inserted, and the knee brought to full extension with compression.  The patellar button was clamped into place, and excess cement  removed. While the cement cured the wound was irrigated out with normal saline solution pulse lavage.. Ligament stability and patellar tracking were checked and found to be excellent.. The parapatellar arthrotomy was closed with  #1 Vicryl suture. The subcutaneous tissue with 0 and 2-0 undyed  Vicryl suture, and 4-0 Monocryl.. Farmer dressing of Aquaseal,  4 x 4, dressing sponges, Webril, and Ace wrap applied. Needle and sponge count were correct times 2.The patient awakened, extubated, and taken to recovery room without difficulty. Vascular status was  normal, pulses 2+ and symmetric.   [name] 03/12/2017, 9:04 AM

## 2017-03-12 NOTE — Anesthesia Postprocedure Evaluation (Addendum)
Anesthesia Post Note  Patient: [name]  Procedure(s) Performed: Procedure(s) (LRB): LEFT TOTAL KNEE ARTHROPLASTY (Left)  Patient location during evaluation: PACU Anesthesia Type: Spinal Level of consciousness: awake and sedated Pain management: pain level controlled Vital Signs Assessment: post-procedure vital signs reviewed and stable Respiratory status: spontaneous breathing Cardiovascular status: stable Postop Assessment: no headache, no backache, spinal receding, patient able to bend at knees and no signs of nausea or vomiting Anesthetic complications: no        Last Vitals:  Vitals:   03/12/17 1020 03/12/17 1035  BP: (!) 146/71 126/63  Pulse: 92 97  Resp: 11 11  Temp:      Last Pain:  Vitals:   03/12/17 1035  TempSrc:   PainSc: 2    Pain Goal:    LLE Motor Response: Purposeful movement;Responds to commands (03/12/17 1035) LLE Sensation: Decreased (03/12/17 1035) RLE Motor Response: Purposeful movement;Responds to commands (03/12/17 1035) RLE Sensation: Decreased (03/12/17 1035) L Sensory Level: S1-Sole of foot, small toes (03/12/17 1035) R Sensory Level: S1-Sole of foot, small toes (03/12/17 1035)  [facility]

## 2017-03-12 NOTE — Anesthesia Preprocedure Evaluation (Addendum)
Anesthesia Evaluation  Patient identified by MRN, date of birth, ID band Patient awake    Reviewed: Allergy & Precautions, NPO status , Patient's Chart, lab work & pertinent test results  Airway Mallampati: II       Dental no notable dental hx. (+) Poor Dentition   Pulmonary former smoker,    Pulmonary exam normal        Cardiovascular hypertension, Pt. on medications Normal cardiovascular exam Rhythm:Regular Rate:Tachycardia     Neuro/Psych negative neurological ROS     GI/Hepatic   Endo/Other  Morbid obesity  Renal/GU      Musculoskeletal   Abdominal (+) + obese,   Peds  Hematology   Anesthesia Other Findings   Reproductive/Obstetrics                            Anesthesia Physical Anesthesia Plan  ASA: III  Anesthesia Plan: Spinal   Post-op Pain Management:  Regional for Post-op pain   Induction:   Airway Management Planned:   Additional Equipment:   Intra-op Plan:   Post-operative Plan:   Informed Consent: I have reviewed the patients History and Physical, chart, labs and discussed the procedure including the risks, benefits and alternatives for the proposed anesthesia with the patient or authorized representative who has indicated his/her understanding and acceptance.     Plan Discussed with: Surgeon and CRNA  Anesthesia Plan Comments:         Anesthesia Quick Evaluation

## 2017-03-12 NOTE — Transfer of Care (Signed)
Immediate Anesthesia Transfer of Care Note  Patient: [name]  Procedure(s) Performed: Procedure(s): LEFT TOTAL KNEE ARTHROPLASTY (Left)  Patient Location: PACU  Anesthesia Type:Regional and Spinal  Level of Consciousness: awake, alert  and oriented  Airway & Oxygen Therapy: Patient Spontanous Breathing and Patient connected to nasal cannula oxygen  Post-op Assessment: Report given to RN and Post -op Vital signs reviewed and stable  Post vital signs: Reviewed and stable  Last Vitals:  Vitals:   03/12/17 0556 03/12/17 0613  BP: 134/70   Pulse: (!) 110 98  Resp: 20   Temp: 36.7 C     Last Pain:  Vitals:   03/12/17 0556  TempSrc: Oral         Complications: No apparent anesthesia complications

## 2017-03-12 NOTE — Anesthesia Procedure Notes (Signed)
Date/Time: 03/12/2017 7:30 AM Performed by: Izora Gala Pre-anesthesia Checklist: Patient identified, Emergency Drugs available and Suction available Patient Re-evaluated:Patient Re-evaluated prior to inductionOxygen Delivery Method: Simple face mask Intubation Type: IV induction Placement Confirmation: positive ETCO2

## 2017-03-12 NOTE — Addendum Note (Signed)
Addendum  created 03/12/17 1043 by Lyn Hollingshead, MD   Anesthesia Review and Sign - Ready for Procedure

## 2017-03-12 NOTE — Evaluation (Signed)
Physical Therapy Evaluation Patient Details Name: [name] MRN: [id number] DOB: 14-Feb-1961 Today's Date: 03/12/2017   History of Present Illness  Pt underwent left TKA.  PMH:  HTN, tachycardia, former smoker, obesity  Clinical Impression  Pt admitted with above diagnosis. Pt currently with functional limitations due to the deficits listed below (see PT Problem List). Pt was able to ambulate with RW with good technique overall.  Shouldprogres well.  Will follow acutely.  Pt will benefit from skilled PT to increase their independence and safety with mobility to allow discharge to the venue listed below.      Follow Up Recommendations Home health PT;Supervision/Assistance - 24 hour    Equipment Recommendations  Rolling walker with 5" wheels    Recommendations for Other Services       Precautions / Restrictions Precautions Precautions: Knee;Fall Precaution Booklet Issued: Yes (comment) Required Braces or Orthoses: Knee Immobilizer - Left Knee Immobilizer - Left: On when out of bed or walking Restrictions Weight Bearing Restrictions: Yes LLE Weight Bearing: Weight bearing as tolerated      Mobility  Bed Mobility Overal bed mobility: Independent                Transfers Overall transfer level: Needs assistance Equipment used: Rolling walker (2 wheeled) Transfers: Sit to/from Stand Sit to Stand: Min guard         General transfer comment: Cues needed for foot and hand placement only.   Ambulation/Gait Ambulation/Gait assistance: Min guard Ambulation Distance (Feet): 25 Feet Assistive device: Rolling walker (2 wheeled) Gait Pattern/deviations: Step-to pattern;Decreased step length - left;Decreased stance time - left;Decreased weight shift to left;Antalgic   Gait velocity interpretation: Below normal speed for age/gender General Gait Details: Pt was able to ambulate with RW with good sequencing with cues.    Stairs            Wheelchair Mobility     Modified Rankin (Stroke Patients Only)       Balance Overall balance assessment: Needs assistance;History of Falls Sitting-balance support: No upper extremity supported;Feet supported Sitting balance-Leahy Scale: Fair     Standing balance support: Bilateral upper extremity supported;During functional activity Standing balance-Leahy Scale: Poor Standing balance comment: relies on RW for support                             Pertinent Vitals/Pain Pain Assessment: 0-10 Pain Score: 6  Pain Location: Left knee Pain Descriptors / Indicators: Aching;Grimacing;Guarding;Operative site guarding Pain Intervention(s): Limited activity within patient's tolerance;Monitored during session;Premedicated before session;Repositioned    Home Living Family/patient expects to be discharged to:: Private residence Living Arrangements: Spouse/significant other Available Help at Discharge: Family;Available 24 hours/day Type of Home: House Home Access: Stairs to enter Entrance Stairs-Rails: None Entrance Stairs-Number of Steps: 3 Home Layout: One level Home Equipment: None      Prior Function Level of Independence: Independent               Hand Dominance   Dominant Hand: Right    Extremity/Trunk Assessment   Upper Extremity Assessment Upper Extremity Assessment: Defer to OT evaluation    Lower Extremity Assessment Lower Extremity Assessment: LLE deficits/detail LLE: Unable to fully assess due to pain    Cervical / Trunk Assessment Cervical / Trunk Assessment: Normal  Communication   Communication: No difficulties  Cognition Arousal/Alertness: Awake/alert Behavior During Therapy: WFL for tasks assessed/performed Overall Cognitive Status: Within Functional Limits for tasks assessed  General Comments      Exercises Total Joint Exercises Ankle Circles/Pumps: AROM;Both;10 reps;Supine Quad Sets: AROM;Both;10  reps;Supine Knee Flexion: AAROM;Left;5 reps;Seated Goniometric ROM: 10-70 degrees   Assessment/Plan    PT Assessment Patient needs continued PT services  PT Problem List Decreased strength;Decreased range of motion;Decreased activity tolerance;Decreased balance;Decreased mobility;Decreased knowledge of use of DME;Decreased safety awareness;Decreased knowledge of precautions;Pain;Obesity       PT Treatment Interventions DME instruction;Gait training;Stair training;Functional mobility training;Therapeutic activities;Therapeutic exercise;Balance training;Patient/family education    PT Goals (Current goals can be found in the Care Plan section)  Acute Rehab PT Goals Patient Stated Goal: to go home PT Goal Formulation: With patient Time For Goal Achievement: 03/19/17 Potential to Achieve Goals: Good    Frequency 7X/week   Barriers to discharge        Co-evaluation               AM-PAC PT "6 Clicks" Daily Activity  Outcome Measure Difficulty turning over in bed (including adjusting bedclothes, sheets and blankets)?: None Difficulty moving from lying on back to sitting on the side of the bed? : None Difficulty sitting down on and standing up from a chair with arms (e.g., wheelchair, bedside commode, etc,.)?: A Little Help needed moving to and from a bed to chair (including a wheelchair)?: A Little Help needed walking in hospital room?: A Little Help needed climbing 3-5 steps with a railing? : Total 6 Click Score: 18    End of Session Equipment Utilized During Treatment: Gait belt;Left knee immobilizer Activity Tolerance: Patient limited by fatigue;Patient limited by pain Patient left: in chair;with call bell/phone within reach;with chair alarm set;with family/visitor present Nurse Communication: Mobility status PT Visit Diagnosis: Unsteadiness on feet (R26.81);Muscle weakness (generalized) (M62.81);Pain Pain - Right/Left: Left Pain - part of body: Knee    Time:  4166-0630 PT Time Calculation (min) (ACUTE ONLY): 32 min   Charges:   PT Evaluation $PT Eval Moderate Complexity: 1 Procedure PT Treatments $Gait Training: 8-22 mins   PT G Codes:        Nawal Dalyce Renne,PT Acute Rehabilitation [phone] (pager)   Denice Paradise 03/12/2017, 3:50 PM

## 2017-03-12 NOTE — Anesthesia Procedure Notes (Addendum)
Anesthesia Regional Block: Adductor canal block   Pre-Anesthetic Checklist: ,, timeout performed, Correct Patient, Correct Site, Correct Laterality, Correct Procedure, Correct Position, site marked, Risks and benefits discussed,  Surgical consent,  Pre-op evaluation,  At surgeon's request and post-op pain management  Laterality: Left and Upper  Prep: chloraprep       Needles:  Injection technique: Single-shot  Needle Type: Echogenic Stimulator Needle     Needle Length: 9cm  Needle Gauge: 21     Additional Needles:   Procedures: ultrasound guided,,,,,,,,  Narrative:  Start time: 03/12/2017 7:05 AM End time: 03/12/2017 7:10 AM Injection made incrementally with aspirations every 5 mL.  Performed by: Personally  Anesthesiologist: Lyn Hollingshead

## 2017-03-12 NOTE — Progress Notes (Signed)
Orthopedic Tech Progress Note Patient Details:  [name] 1961/07/27 [id number]  CPM Left Knee CPM Left Knee: On Left Knee Flexion (Degrees): 90 Left Knee Extension (Degrees): 0 Additional Comments: foot roll   [place] Pink 03/12/2017, 10:29 AM

## 2017-03-12 NOTE — Interval H&P Note (Signed)
History and Physical Interval Note:  03/12/2017 6:59 AM  [name]  has presented today for surgery, with the diagnosis of DJD LEFT TKNEE  The various methods of treatment have been discussed with the patient and family. After consideration of risks, benefits and other options for treatment, the patient has consented to  Procedure(s): TOTAL KNEE ARTHROPLASTY (Left) as a surgical intervention .  The patient's history has been reviewed, patient examined, no change in status, stable for surgery.  I have reviewed the patient's chart and labs.  Questions were answered to the patient's satisfaction.     Elsie Saas A

## 2017-03-12 NOTE — Progress Notes (Signed)
PHARMACIST - PHYSICIAN ORDER COMMUNICATION  CONCERNING: P&T Medication Policy on Herbal Medications  DESCRIPTION:  This patient's order for: phillips colon health cap  has been noted.  This product(s) is classified as an "herbal" or natural product. Due to a lack of definitive safety studies or FDA approval, nonstandard manufacturing practices, plus the potential risk of unknown drug-drug interactions while on inpatient medications, the Pharmacy and Therapeutics Committee does not permit the use of "herbal" or natural products of this type within [facility].   ACTION TAKEN: The pharmacy department is unable to verify this order at this time and your patient has been informed of this safety policy. Please reevaluate patient's clinical condition at discharge and address if the herbal or natural product(s) should be resumed at that time.  Maryanna Shape, PharmD, BCPS  Clinical Pharmacist  Pager: [phone]

## 2017-03-13 ENCOUNTER — Encounter (HOSPITAL_COMMUNITY): Payer: Self-pay | Admitting: Orthopedic Surgery

## 2017-03-13 LAB — CBC
HEMATOCRIT: 37.6 % (ref 36.0–46.0)
HEMOGLOBIN: 11.9 g/dL — AB (ref 12.0–15.0)
MCH: 31.1 pg (ref 26.0–34.0)
MCHC: 31.6 g/dL (ref 30.0–36.0)
MCV: 98.2 fL (ref 78.0–100.0)
Platelets: 217 10*3/uL (ref 150–400)
RBC: 3.83 MIL/uL — AB (ref 3.87–5.11)
RDW: 13.8 % (ref 11.5–15.5)
WBC: 14.6 10*3/uL — ABNORMAL HIGH (ref 4.0–10.5)

## 2017-03-13 LAB — BASIC METABOLIC PANEL
Anion gap: 10 (ref 5–15)
BUN: 21 mg/dL — AB (ref 6–20)
CO2: 25 mmol/L (ref 22–32)
Calcium: 9.1 mg/dL (ref 8.9–10.3)
Chloride: 105 mmol/L (ref 101–111)
Creatinine, Ser: 0.75 mg/dL (ref 0.44–1.00)
GFR calc Af Amer: >60 mL/min (ref >60)
GFR calc non Af Amer: >60 mL/min (ref >60)
GLUCOSE: 171 mg/dL — AB (ref 65–99)
POTASSIUM: 4.4 mmol/L (ref 3.5–5.1)
Sodium: 140 mmol/L (ref 135–145)

## 2017-03-13 NOTE — Evaluation (Addendum)
Occupational Therapy Evaluation/Discharge Patient Details Name: [name] MRN: [id number] DOB: 05-21-1961 Today's Date: 03/13/2017    History of Present Illness Pt underwent left TKA.  PMH:  HTN, tachycardia, former smoker, obesity   Clinical Impression   PTA, pt was independent with ADL and functional mobility. She currently requires min guard assist for LB ADL and tub transfers. She requires supervision for toilet transfers. Educated pt and family concerning knee precautions during ADL, fall prevention strategies, safe tub transfers with 3-in-1, and compensatory dressing techniques. Pt and family report and demonstrate understanding of all topics. She will have 24 hour assistance post-acute D/C from family. No further acute OT needs identified. OT will sign off.     Follow Up Recommendations  No OT follow up;Supervision/Assistance - 24 hour    Equipment Recommendations  3 in 1 bedside commode    Recommendations for Other Services       Precautions / Restrictions Precautions Precautions: Knee;Fall Precaution Booklet Issued: No Precaution Comments: Reviewed knee precautions during ADL including no pillow under knee and use of ice for pain management.  Required Braces or Orthoses: Knee Immobilizer - Left Knee Immobilizer - Left: On when out of bed or walking ("until specified") Restrictions Weight Bearing Restrictions: Yes LLE Weight Bearing: Weight bearing as tolerated      Mobility Bed Mobility               General bed mobility comments: OOB in therapy gym with PT on arrival  Transfers Overall transfer level: Needs assistance Equipment used: Rolling walker (2 wheeled) Transfers: Sit to/from Stand Sit to Stand: Supervision         General transfer comment: Supervision for safety and VC's for hand placement.    Balance Overall balance assessment: Needs assistance;History of Falls Sitting-balance support: No upper extremity supported;Feet supported Sitting  balance-Leahy Scale: Good     Standing balance support: Bilateral upper extremity supported;During functional activity;No upper extremity supported Standing balance-Leahy Scale: Fair Standing balance comment: Able to statically stand without UE support for ADL tasks.                            ADL either performed or assessed with clinical judgement   ADL Overall ADL's : Needs assistance/impaired Eating/Feeding: Set up;Sitting   Grooming: Set up;Sitting   Upper Body Bathing: Set up;Sitting   Lower Body Bathing: Min guard;Sit to/from stand   Upper Body Dressing : Set up;Sitting   Lower Body Dressing: Min guard;Sit to/from stand   Toilet Transfer: Supervision/safety;Ambulation;RW   Toileting- Clothing Manipulation and Hygiene: Supervision/safety;Sit to/from stand   Tub/ Shower Transfer: Min guard;Ambulation;3 in 1;Rolling walker   Functional mobility during ADLs: Rolling walker;Min guard General ADL Comments: Pt instructed in safe tub transfer with 3-in-1 with handout provided. Pt additionally educated concerning dressing techniques and home set-up to improve safety post-acute D/C.     Vision Patient Visual Report: No change from baseline Vision Assessment?: No apparent visual deficits     Perception     Praxis      Pertinent Vitals/Pain Pain Assessment: Faces Faces Pain Scale: Hurts little more Pain Location: Left knee Pain Descriptors / Indicators: Aching;Grimacing;Guarding;Operative site guarding Pain Intervention(s): Monitored during session;Repositioned     Hand Dominance Right   Extremity/Trunk Assessment Upper Extremity Assessment Upper Extremity Assessment: Overall WFL for tasks assessed   Lower Extremity Assessment Lower Extremity Assessment: LLE deficits/detail LLE Deficits / Details: Decreased strength and ROM as expected post-operatively.  Cervical / Trunk Assessment Cervical / Trunk Assessment: Normal   Communication  Communication Communication: No difficulties   Cognition Arousal/Alertness: Awake/alert Behavior During Therapy: WFL for tasks assessed/performed Overall Cognitive Status: Within Functional Limits for tasks assessed                                     General Comments       Exercises     Shoulder Instructions      Home Living Family/patient expects to be discharged to:: Private residence Living Arrangements: Spouse/significant other Available Help at Discharge: Family;Available 24 hours/day Type of Home: House Home Access: Stairs to enter [employer] of Steps: 3 Entrance Stairs-Rails: None Home Layout: One level     Bathroom Shower/Tub: Corporate investment banker: Standard Bathroom Accessibility: Yes   Home Equipment: None          Prior Functioning/Environment Level of Independence: Independent                 OT Problem List: Decreased strength;Decreased range of motion;Decreased activity tolerance;Impaired balance (sitting and/or standing);Decreased safety awareness;Decreased knowledge of use of DME or AE;Decreased knowledge of precautions;Pain      OT Treatment/Interventions:      OT Goals(Current goals can be found in the care plan section) Acute Rehab OT Goals Patient Stated Goal: to go home OT Goal Formulation: With patient/family Time For Goal Achievement: 03/27/17 Potential to Achieve Goals: Good  OT Frequency:     Barriers to D/C:            Co-evaluation              AM-PAC PT "6 Clicks" Daily Activity     Outcome Measure Help from another person eating meals?: None Help from another person taking care of personal grooming?: A Little Help from another person toileting, which includes using toliet, bedpan, or urinal?: A Little Help from another person bathing (including washing, rinsing, drying)?: A Little Help from another person to put on and taking off regular upper body clothing?: A  Little Help from another person to put on and taking off regular lower body clothing?: A Little 6 Click Score: 19   End of Session Equipment Utilized During Treatment: Gait belt;Rolling walker  Activity Tolerance: Patient tolerated treatment well Patient left: in chair;with call bell/phone within reach;with family/visitor present  OT Visit Diagnosis: Unsteadiness on feet (R26.81);Other abnormalities of gait and mobility (R26.89);Pain Pain - Right/Left: Left Pain - part of body: Knee                Time: 3094-0768 OT Time Calculation (min): 23 min Charges:  OT General Charges $OT Visit: 1 Procedure OT Evaluation $OT Eval Moderate Complexity: 1 Procedure OT Treatments $Self Care/Home Management : 8-22 mins G-Codes:     Norman Herrlich, MS OTR/L  Pager: [phone].   Saafir Abdullah A Saben Donigan 03/13/2017, 10:34 AM

## 2017-03-13 NOTE — Progress Notes (Signed)
Orthopedic Tech Progress Note Patient Details:  KYARRA VANCAMP Oct 22, 1961 [id number]  CPM Left Knee CPM Left Knee: On Left Knee Flexion (Degrees): 60 Left Knee Extension (Degrees): 0   Kristopher Oppenheim 03/13/2017, 6:12 AM

## 2017-03-13 NOTE — Progress Notes (Signed)
Physical Therapy Treatment Patient Details Name: [name] MRN: [id number] DOB: 1961-09-09 Today's Date: 03/13/2017    History of Present Illness Pt underwent left TKA.  PMH:  HTN, tachycardia, former smoker, obesity    PT Comments    Pt admitted with above diagnosis. Pt currently with functional limitations due to balance and endurance deficits as well as ROM deficits. Pt was able to ambulate to gym and perform steps.  Husband educated and handout given.  Pt also performed exercises.  Progressing very well.   Pt will benefit from skilled PT to increase their independence and safety with mobility to allow discharge to the venue listed below.     Follow Up Recommendations  Home health PT;Supervision/Assistance - 24 hour     Equipment Recommendations  Rolling walker with 5" wheels    Recommendations for Other Services       Precautions / Restrictions Precautions Precautions: Knee;Fall Precaution Booklet Issued: No Precaution Comments: Reviewed knee precautions during ADL including no pillow under knee and use of ice for pain management.  Restrictions Weight Bearing Restrictions: Yes LLE Weight Bearing: Weight bearing as tolerated    Mobility  Bed Mobility               General bed mobility comments: In chair on arrival  Transfers Overall transfer level: Needs assistance Equipment used: Rolling walker (2 wheeled) Transfers: Sit to/from Stand Sit to Stand: Supervision         General transfer comment: Supervision for safety and VC's for hand placement.  Ambulation/Gait Ambulation/Gait assistance: Min guard;Supervision Ambulation Distance (Feet): 350 Feet Assistive device: Rolling walker (2 wheeled) Gait Pattern/deviations: Step-to pattern;Decreased step length - left;Decreased stance time - left;Decreased weight shift to left;Antalgic   Gait velocity interpretation: <1.8 ft/sec, indicative of risk for recurrent falls General Gait Details: Pt was able to  ambulate with RW with good sequencing to gym.  Did well..    Stairs Stairs: Yes   Stair Management: No rails;Step to pattern;Backwards;With walker Number of Stairs: 3 General stair comments: No problems on stairs and husband present and educated as well. Gave handout.   Wheelchair Mobility    Modified Rankin (Stroke Patients Only)       Balance Overall balance assessment: Needs assistance;History of Falls Sitting-balance support: No upper extremity supported;Feet supported Sitting balance-Leahy Scale: Good     Standing balance support: During functional activity;No upper extremity supported Standing balance-Leahy Scale: Fair Standing balance comment: Able to statically stand without UE support for ADL tasks.                             Cognition Arousal/Alertness: Awake/alert Behavior During Therapy: WFL for tasks assessed/performed Overall Cognitive Status: Within Functional Limits for tasks assessed                                        Exercises Total Joint Exercises Ankle Circles/Pumps: AROM;Both;10 reps;Supine Quad Sets: AROM;Both;10 reps;Supine Towel Squeeze: AROM;Both;10 reps;Supine Heel Slides: AAROM;Both;10 reps;Supine Hip ABduction/ADduction: AROM;Both;10 reps;Supine Straight Leg Raises: AROM;10 reps;Left;Supine Long Arc Quad: AROM;Left;10 reps;Seated Knee Flexion: AAROM;Left;Seated;10 reps Goniometric ROM: 8-87 degrees    General Comments General comments (skin integrity, edema, etc.): Pt and husband instructed in car transfer technique.       Pertinent Vitals/Pain Pain Assessment: Faces Faces Pain Scale: Hurts little more Pain Location: Left knee Pain Descriptors /  Indicators: Aching;Grimacing;Guarding;Operative site guarding Pain Intervention(s): Monitored during session;Repositioned    Home Living Family/patient expects to be discharged to:: Private residence Living Arrangements: Spouse/significant other Available  Help at Discharge: Family;Available 24 hours/day Type of Home: House Home Access: Stairs to enter Entrance Stairs-Rails: None Home Layout: One level Home Equipment: None      Prior Function Level of Independence: Independent          PT Goals (current goals can now be found in the care plan section) Progress towards PT goals: Progressing toward goals    Frequency    7X/week      PT Plan Current plan remains appropriate    Co-evaluation              AM-PAC PT "6 Clicks" Daily Activity  Outcome Measure  Difficulty turning over in bed (including adjusting bedclothes, sheets and blankets)?: None Difficulty moving from lying on back to sitting on the side of the bed? : None Difficulty sitting down on and standing up from a chair with arms (e.g., wheelchair, bedside commode, etc,.)?: A Little Help needed moving to and from a bed to chair (including a wheelchair)?: A Little Help needed walking in hospital room?: A Little Help needed climbing 3-5 steps with a railing? : A Little 6 Click Score: 20    End of Session Equipment Utilized During Treatment: Gait belt;Left knee immobilizer Activity Tolerance: Patient tolerated treatment well Patient left: with family/visitor present (in gym with OT.) Nurse Communication: Mobility status PT Visit Diagnosis: Unsteadiness on feet (R26.81);Muscle weakness (generalized) (M62.81);Pain Pain - Right/Left: Left Pain - part of body: Knee     Time: 6431-4276 PT Time Calculation (min) (ACUTE ONLY): 39 min  Charges:  $Gait Training: 8-22 mins $Therapeutic Exercise: 8-22 mins $Self Care/Home Management: 8-22                    G Codes:       [name],PT Acute Rehabilitation [phone] (pager)    [name] 03/13/2017, 10:54 AM

## 2017-03-13 NOTE — Progress Notes (Signed)
Physical Therapy Treatment Patient Details Name: [name] MRN: [id number] DOB: [birth year] Today's Date: 03/13/2017    History of Present Illness Pt underwent left TKA.  PMH:  HTN, tachycardia, former smoker, obesity    PT Comments    Pt remains to mobilize well with therapy this pm.  Re-educated and performed therapeutic exercise with husband present for carryover at home.     Follow Up Recommendations  Home health PT;Supervision/Assistance - 24 hour     Equipment Recommendations  Rolling walker with 5" wheels    Recommendations for Other Services       Precautions / Restrictions Precautions Precautions: Knee;Fall Precaution Booklet Issued: No Precaution Comments: Reviewed knee precautions during ADL including no pillow under knee and use of ice for pain management.  Required Braces or Orthoses: Knee Immobilizer - Left Knee Immobilizer - Left: On when out of bed or walking Restrictions Weight Bearing Restrictions: Yes LLE Weight Bearing: Weight bearing as tolerated    Mobility  Bed Mobility Overal bed mobility: Independent             General bed mobility comments: In chair on arrival  Transfers Overall transfer level: Needs assistance Equipment used: Rolling walker (2 wheeled) Transfers: Sit to/from Stand Sit to Stand: Supervision         General transfer comment: Supervision for safety and VC's for hand placement.  Ambulation/Gait Ambulation/Gait assistance: Min guard;Supervision   Assistive device: Rolling walker (2 wheeled) Gait Pattern/deviations: Step-through pattern;Antalgic;Trunk flexed     General Gait Details: Cues for Knee extension in stance phase on L, heel strike on L, knee flexion during swing phase.  Pt used RW for balance vs. physical support.     Stairs            Wheelchair Mobility    Modified Rankin (Stroke Patients Only)       Balance     Sitting balance-Leahy Scale: Good       Standing balance-Leahy  Scale: Fair                              Cognition Arousal/Alertness: Awake/alert Behavior During Therapy: WFL for tasks assessed/performed Overall Cognitive Status: Within Functional Limits for tasks assessed                                        Exercises Total Joint Exercises Ankle Circles/Pumps: AROM;Both;10 reps;Supine Quad Sets: AROM;Left;10 reps;Supine Gluteal Sets: AROM;10 reps;Supine;Left Towel Squeeze: AROM;Both;10 reps;Supine Short Arc Quad: AROM;Left;10 reps;Supine Heel Slides: AROM;Left;10 reps;Supine Hip ABduction/ADduction: AROM;Left;10 reps;Supine Straight Leg Raises: AROM;Left;10 reps;Supine Long Arc Quad: AROM;Left;10 reps;Supine    General Comments        Pertinent Vitals/Pain Pain Assessment: 0-10 Pain Score: 2  Pain Location: Left knee Pain Descriptors / Indicators: Aching;Grimacing;Guarding;Operative site guarding Pain Intervention(s): Premedicated before session;Limited activity within patient's tolerance;Ice applied    Home Living                      Prior Function            PT Goals (current goals can now be found in the care plan section) Acute Rehab PT Goals Patient Stated Goal: to go home Potential to Achieve Goals: Good Progress towards PT goals: Progressing toward goals    Frequency    7X/week  PT Plan Current plan remains appropriate    Co-evaluation              AM-PAC PT "6 Clicks" Daily Activity  Outcome Measure  Difficulty turning over in bed (including adjusting bedclothes, sheets and blankets)?: None Difficulty moving from lying on back to sitting on the side of the bed? : None Difficulty sitting down on and standing up from a chair with arms (e.g., wheelchair, bedside commode, etc,.)?: A Little Help needed moving to and from a bed to chair (including a wheelchair)?: A Little Help needed walking in hospital room?: A Little Help needed climbing 3-5 steps with a  railing? : A Little 6 Click Score: 20    End of Session Equipment Utilized During Treatment: Gait belt;Left knee immobilizer Activity Tolerance: Patient tolerated treatment well Patient left: in chair;with call bell/phone within reach;with family/visitor present Nurse Communication: Mobility status PT Visit Diagnosis: Unsteadiness on feet (R26.81);Muscle weakness (generalized) (M62.81);Pain Pain - Right/Left: Left Pain - part of body: Knee     Time: 9539-6728 PT Time Calculation (min) (ACUTE ONLY): 29 min  Charges:  $Gait Training: 8-22 mins $Therapeutic Exercise: 8-22 mins                    G Codes:       Governor Rooks, PTA pager [phone]    Cristela Blue 03/13/2017, 5:06 PM

## 2017-03-13 NOTE — Progress Notes (Signed)
Subjective: 1 Day Post-Op Procedure(s) (LRB): LEFT TOTAL KNEE ARTHROPLASTY (Left) Patient reports pain as 4 on 0-10 scale.    Objective: Vital signs in last 24 hours: Temp:  [97.7 F (36.5 C)-98.2 F (36.8 C)] 98.1 F (36.7 C) (05/15 0408) Pulse Rate:  [81-103] 95 (05/15 0408) Resp:  [11-20] 18 (05/15 0408) BP: (115-146)/(57-84) 129/64 (05/15 0408) SpO2:  [94 %-100 %] 96 % (05/15 0408)  Intake/Output from previous day: 05/14 0701 - 05/15 0700 In: 1460 [P.O.:960; IV Piggyback:500] Out: 2800 [Urine:2800] Intake/Output this shift: No intake/output data recorded.   Recent Labs  03/13/17 0520  HGB 11.9*    Recent Labs  03/13/17 0520  WBC 14.6*  RBC 3.83*  HCT 37.6  PLT 217    Recent Labs  03/13/17 0520  NA 140  K 4.4  CL 105  CO2 25  BUN 21*  CREATININE 0.75  GLUCOSE 171*  CALCIUM 9.1   No results for input(s): LABPT, INR in the last 72 hours.  ABD soft Neurovascular intact Sensation intact distally Intact pulses distally Dorsiflexion/Plantar flexion intact Incision: dressing C/D/I  Assessment/Plan: 1 Day Post-Op Procedure(s) (LRB): LEFT TOTAL KNEE ARTHROPLASTY (Left)  Principal Problem:   Primary localized osteoarthritis of left knee Active Problems:   Hypothyroidism   Hypertension   Cancer ([place])  Advance diet Up with therapy D/C IV fluids Plan for discharge tomorrow Discharge home with home health  Sharonlee Nine J 03/13/2017, 8:30 AM

## 2017-03-14 LAB — BASIC METABOLIC PANEL
Anion gap: 9 (ref 5–15)
BUN: 18 mg/dL (ref 6–20)
CO2: 24 mmol/L (ref 22–32)
Calcium: 8.9 mg/dL (ref 8.9–10.3)
Chloride: 104 mmol/L (ref 101–111)
Creatinine, Ser: 0.65 mg/dL (ref 0.44–1.00)
GFR calc Af Amer: >60 mL/min (ref >60)
GFR calc non Af Amer: >60 mL/min (ref >60)
Glucose, Bld: 155 mg/dL — ABNORMAL HIGH (ref 65–99)
Potassium: 4.3 mmol/L (ref 3.5–5.1)
Sodium: 137 mmol/L (ref 135–145)

## 2017-03-14 LAB — CBC
HCT: 37.2 % (ref 36.0–46.0)
Hemoglobin: 11.9 g/dL — ABNORMAL LOW (ref 12.0–15.0)
MCH: 31.3 pg (ref 26.0–34.0)
MCHC: 32 g/dL (ref 30.0–36.0)
MCV: 97.9 fL (ref 78.0–100.0)
Platelets: 203 10*3/uL (ref 150–400)
RBC: 3.8 MIL/uL — ABNORMAL LOW (ref 3.87–5.11)
RDW: 13.9 % (ref 11.5–15.5)
WBC: 18.3 10*3/uL — ABNORMAL HIGH (ref 4.0–10.5)

## 2017-03-14 MED ORDER — ASPIRIN 325 MG PO TBEC: DELAYED_RELEASE_TABLET | ORAL | 0 refills | Status: DC

## 2017-03-14 MED ORDER — POLYETHYLENE GLYCOL 3350 17 G PO PACK: PACK | ORAL | 0 refills | Status: DC

## 2017-03-14 MED ORDER — HYDROMORPHONE HCL 2 MG PO TABS: ORAL_TABLET | ORAL | 0 refills | Status: DC

## 2017-03-14 MED ORDER — ACETAMINOPHEN 325 MG PO TABS: 650.0000 mg | ORAL_TABLET | Freq: Four times a day (QID) | ORAL | Status: DC | PRN

## 2017-03-14 MED ORDER — DOCUSATE SODIUM 100 MG PO CAPS: ORAL_CAPSULE | ORAL | 0 refills | Status: DC

## 2017-03-14 NOTE — Discharge Summary (Signed)
Patient ID: [name] MRN: [id number] DOB/AGE: 12-03-1960 57 y.o.  Admit date: 03/12/2017 Discharge date: 03/14/2017  Admission Diagnoses:  Principal Problem:   Primary localized osteoarthritis of left knee Active Problems:   Hypothyroidism   Hypertension   Cancer [facility])   Discharge Diagnoses:  Same  Past Medical History:  Diagnosis Date  . Anxiety   . Cancer ([place])    hodgkins lymphoma, 1992 Diagnosed  . Hypertension   . Hypothyroidism    "thyroid is dead"  . Primary localized osteoarthritis of left knee     Surgeries: Procedure(s): LEFT TOTAL KNEE ARTHROPLASTY on 03/12/2017   Consultants:   Discharged Condition: Improved  Hospital Course: [name] is an 56 y.o. female who was admitted 03/12/2017 for operative treatment ofPrimary localized osteoarthritis of left knee. Patient has severe unremitting pain that affects sleep, daily activities, and work/hobbies. After pre-op clearance the patient was taken to the operating room on 03/12/2017 and underwent  Procedure(s): LEFT TOTAL KNEE ARTHROPLASTY.    Patient was given perioperative antibiotics: Anti-infectives    Start     Dose/Rate Route Frequency Ordered Stop   03/12/17 1800  vancomycin (VANCOCIN) IVPB 1000 mg/200 mL premix     1,000 mg 200 mL/hr over 60 Minutes Intravenous Every 12 hours 03/12/17 1100 03/12/17 1855   03/12/17 0600  vancomycin (VANCOCIN) 1,500 mg in sodium chloride 0.9 % 500 mL IVPB     1,500 mg 250 mL/hr over 120 Minutes Intravenous To ShortStay Surgical 03/09/17 1052 03/12/17 0745       Patient was given sequential compression devices, early ambulation, and chemoprophylaxis to prevent DVT.  Patient benefited maximally from hospital stay and there were no complications.    Recent vital signs: Patient Vitals for the past 24 hrs:  BP Temp Temp src Pulse Resp SpO2  03/14/17 0540 132/75 98.1 F (36.7 C) Oral 92 18 98 %  03/13/17 2044 (!) 147/84 98.3 F (36.8 C) Oral (!) 110 18 97 %      Recent laboratory studies:  Recent Labs  03/13/17 0520 03/14/17 0729  WBC 14.6* 18.3*  HGB 11.9* 11.9*  HCT 37.6 37.2  PLT 217 203  NA 140 137  K 4.4 4.3  CL 105 104  CO2 25 24  BUN 21* 18  CREATININE 0.75 0.65  GLUCOSE 171* 155*  CALCIUM 9.1 8.9     Discharge Medications:   Allergies as of 03/14/2017      Reactions   Iodine-131 Nausea And Vomiting   Cephalexin Rash   Codeine Itching, Nausea Only   Contrast Media [iodinated Diagnostic Agents] Nausea And Vomiting      Medication List    STOP taking these medications   aspirin 81 MG tablet Replaced by:  aspirin 325 MG EC tablet   cetirizine 10 MG tablet Commonly known as:  ZYRTEC   CINNAMON PO   traMADol 50 MG tablet Commonly known as:  ULTRAM     TAKE these medications   acetaminophen 325 MG tablet Commonly known as:  TYLENOL Take 2 tablets (650 mg total) by mouth every 6 (six) hours as needed for mild pain (or Fever >/= 101).   amLODipine 10 MG tablet Commonly known as:  NORVASC Take 10 mg by mouth every evening.   aspirin 325 MG EC tablet 1 tab a day for the next 30 days to prevent blood clots Replaces:  aspirin 81 MG tablet   benazepril 20 MG tablet Commonly known as:  LOTENSIN Take 20 mg by mouth  daily.   diphenhydrAMINE 25 mg capsule Commonly known as:  BENADRYL Take 25 mg by mouth at bedtime.   docusate sodium 100 MG capsule Commonly known as:  COLACE 1 tab 2 times a day while on narcotics.  STOOL SOFTENER   HYDROmorphone 2 MG tablet Commonly known as:  DILAUDID 1-2 tablets every 4 hrs as needed for pain   levothyroxine 88 MCG tablet Commonly known as:  SYNTHROID, LEVOTHROID Take 88 mcg by mouth daily before breakfast.   loratadine 10 MG tablet Commonly known as:  CLARITIN Take 10 mg by mouth daily.   PHILLIPS COLON HEALTH PO Take 1 tablet by mouth daily.   polyethylene glycol packet Commonly known as:  MIRALAX / GLYCOLAX 17grams in 16 oz of water twice a day until bowel  movement.  LAXITIVE.  Restart if two days since last bowel movement   pravastatin 20 MG tablet Commonly known as:  PRAVACHOL Take 20 mg by mouth every evening.   TURMERIC PO Take 1 tablet by mouth daily.       Diagnostic Studies: No results found.  Disposition: 01-Home or Self Care  Discharge Instructions    CPM    Complete by:  As directed    Continuous passive motion machine (CPM):      Use the CPM from 0 to 90 for 6 hours per day.       You may break it up into 2 or 3 sessions per day.      Use CPM for 2 weeks or until you are told to stop.   Call MD / Call 911    Complete by:  As directed    If you experience chest pain or shortness of breath, CALL 911 and be transported to the hospital emergency room.  If you develope a fever above 101 F, pus (white drainage) or increased drainage or redness at the wound, or calf pain, call your surgeon's office.   Change dressing    Complete by:  As directed    Change the gauze dressing daily with sterile 4 x 4 inch gauze and apply TED hose.  DO NOT REMOVE BANDAGE OVER SURGICAL INCISION.  [place] WHOLE LEG INCLUDING OVER THE WATERPROOF BANDAGE WITH SOAP AND WATER EVERY DAY.   Constipation Prevention    Complete by:  As directed    Drink plenty of fluids.  Prune juice may be helpful.  You may use a stool softener, such as Colace (over the counter) 100 mg twice a day.  Use MiraLax (over the counter) for constipation as needed.   Diet - low sodium heart healthy    Complete by:  As directed    Discharge instructions    Complete by:  As directed    INSTRUCTIONS AFTER JOINT REPLACEMENT   Remove items at home which could result in a fall. This includes throw rugs or furniture in walking pathways ICE to the affected joint every three hours while awake for 30 minutes at a time, for at least the first 3-5 days, and then as needed for pain and swelling.  Continue to use ice for pain and swelling. You may notice swelling that will progress down to the  foot and ankle.  This is normal after surgery.  Elevate your leg when you are not up walking on it.   Continue to use the breathing machine you got in the hospital (incentive spirometer) which will help keep your temperature down.  It is common for your temperature to cycle up  and down following surgery, especially at night when you are not up moving around and exerting yourself.  The breathing machine keeps your lungs expanded and your temperature down.   DIET:  As you were doing prior to hospitalization, we recommend a well-balanced diet.  DRESSING / WOUND CARE / SHOWERING  Keep the surgical dressing until follow up.  The dressing is water proof, so you can shower without any extra covering.  IF THE DRESSING FALLS OFF or the wound gets wet inside, change the dressing with sterile gauze.  Please use good hand washing techniques before changing the dressing.  Do not use any lotions or creams on the incision until instructed by your surgeon.    ACTIVITY  Increase activity slowly as tolerated, but follow the weight bearing instructions below.   No driving for 6 weeks or until further direction given by your physician.  You cannot drive while taking narcotics.  No lifting or carrying greater than 10 lbs. until further directed by your surgeon. Avoid periods of inactivity such as sitting longer than an hour when not asleep. This helps prevent blood clots.  You may return to work once you are authorized by your doctor.     WEIGHT BEARING   Weight bearing as tolerated with assist device (walker, cane, etc) as directed, use it as long as suggested by your surgeon or therapist, typically at least 2-3 weeks.   EXERCISES  Results after joint replacement surgery are often greatly improved when you follow the exercise, range of motion and muscle strengthening exercises prescribed by your doctor. Safety measures are also important to protect the joint from further injury. Any time any of these  exercises cause you to have increased pain or swelling, decrease what you are doing until you are comfortable again and then slowly increase them. If you have problems or questions, call your caregiver or physical therapist for advice.   Rehabilitation is important following a joint replacement. After just a few days of immobilization, the muscles of the leg can become weakened and shrink (atrophy).  These exercises are designed to build up the tone and strength of the thigh and leg muscles and to improve motion. Often times heat used for twenty to thirty minutes before working out will loosen up your tissues and help with improving the range of motion but do not use heat for the first two weeks following surgery (sometimes heat can increase post-operative swelling).   These exercises can be done on a training (exercise) mat, on the floor, on a table or on a bed. Use whatever works the best and is most comfortable for you.    Use music or television while you are exercising so that the exercises are a pleasant break in your day. This will make your life better with the exercises acting as a break in your routine that you can look forward to.   Perform all exercises about fifteen times, three times per day or as directed.  You should exercise both the operative leg and the other leg as well.   Exercises include:  Quad Sets - Tighten up the muscle on the front of the thigh (Quad) and hold for 5-10 seconds.   Straight Leg Raises - With your knee straight (if you were given a brace, keep it on), lift the leg to 60 degrees, hold for 3 seconds, and slowly lower the leg.  Perform this exercise against resistance later as your leg gets stronger.  Leg Slides: Lying on your  back, slowly slide your foot toward your buttocks, bending your knee up off the floor (only go as far as is comfortable). Then slowly slide your foot back down until your leg is flat on the floor again.  Angel Wings: Lying on your back spread  your legs to the side as far apart as you can without causing discomfort.  Hamstring Strength:  Lying on your back, push your heel against the floor with your leg straight by tightening up the muscles of your buttocks.  Repeat, but this time bend your knee to a comfortable angle, and push your heel against the floor.  You may put a pillow under the heel to make it more comfortable if necessary.   A rehabilitation program following joint replacement surgery can speed recovery and prevent re-injury in the future due to weakened muscles. Contact your doctor or a physical therapist for more information on knee rehabilitation.    CONSTIPATION  Constipation is defined medically as fewer than three stools per week and severe constipation as less than one stool per week.  Even if you have a regular bowel pattern at home, your normal regimen is likely to be disrupted due to multiple reasons following surgery.  Combination of anesthesia, postoperative narcotics, change in appetite and fluid intake all can affect your bowels.   YOU MUST use at least one of the following options; they are listed in order of increasing strength to get the job done.  They are all available over the counter, and you may need to use some, POSSIBLY even all of these options:    Drink plenty of fluids (prune juice may be helpful) and high fiber foods Colace 100 mg by mouth twice a day  Senokot for constipation as directed and as needed Dulcolax (bisacodyl), take with full glass of water  Miralax (polyethylene glycol) once or twice a day as needed.  If you have tried all these things and are unable to have a bowel movement in the first 3-4 days after surgery call either your surgeon or your primary doctor.    If you experience loose stools or diarrhea, hold the medications until you stool forms back up.  If your symptoms do not get better within 1 week or if they get worse, check with your doctor.  If you experience "the worst  abdominal pain ever" or develop nausea or vomiting, please contact the office immediately for further recommendations for treatment.   ITCHING:  If you experience itching with your medications, try taking only a single pain pill, or even half a pain pill at a time.  You can also use Benadryl over the counter for itching or also to help with sleep.   TED HOSE STOCKINGS:  Use stockings on both legs until for at least 2 weeks or as directed by physician office. They may be removed at night for sleeping.  MEDICATIONS:  See your medication summary on the "After Visit Summary" that nursing will review with you.  You may have some home medications which will be placed on hold until you complete the course of blood thinner medication.  It is important for you to complete the blood thinner medication as prescribed.  PRECAUTIONS:  If you experience chest pain or shortness of breath - call 911 immediately for transfer to the hospital emergency department.   If you develop a fever greater that 101 F, purulent drainage from wound, increased redness or drainage from wound, foul odor from the wound/dressing, or calf pain -  CONTACT YOUR SURGEON.                                                   FOLLOW-UP APPOINTMENTS:  If you do not already have a post-op appointment, please call the office for an appointment to be seen by your surgeon.  Guidelines for how soon to be seen are listed in your "After Visit Summary", but are typically between 1-4 weeks after surgery.  OTHER INSTRUCTIONS:   Knee Replacement:  Do not place pillow under knee, focus on keeping the knee straight while resting. CPM instructions: 0-90 degrees, 2 hours in the morning, 2 hours in the afternoon, and 2 hours in the evening. Place foam block, curve side up under heel at all times except when in CPM or when walking.  DO NOT modify, tear, cut, or change the foam block in any way.  MAKE SURE YOU:  Understand these instructions.  Get help right  away if you are not doing well or get worse.    Thank you for letting us be a part of your medical care team.  It is a privilege we respect greatly.  We hope these instructions will help you stay on track for a fast and full recovery!   Do not put a pillow under the knee. Place it under the heel.    Complete by:  As directed    Place gray foam block, curve side up under heel at all times except when in CPM or when walking.  DO NOT modify, tear, cut, or change in any way the gray foam block.   Increase activity slowly as tolerated    Complete by:  As directed    Patient may shower    Complete by:  As directed    Aquacel dressing is water proof    Wash over it and the whole leg with soap and water at the end of your shower   TED hose    Complete by:  As directed    Use stockings (TED hose) for 2 weeks on both leg(s).  You may remove them at night for sleeping.      Follow-up Information    [facility].   Specialty:  [facility] Contact information: [address] [place] [address] [phone]            Signed: Linda Hedges 03/14/2017, 3:32 PM

## 2017-03-14 NOTE — Progress Notes (Signed)
Discharge instructions printed and reviewed with patient and spouse, and copy given for them to take home. All questions addressed at this time. New prescriptions reviewed with patient. IV and surgical ace wrap removed. Ted hose applied to bilateral lower extremities per order. Room searched for patient belongings, and confirmed with patient that all valuables were accounted for. RN assisted patient to dress and apply knee immobilizer, then staff escorted patient to discharge via wheelchair.

## 2017-03-14 NOTE — Progress Notes (Signed)
Physical Therapy Treatment Patient Details Name: [name] MRN: [id number] DOB: 1961-09-28 Today's Date: 03/14/2017    History of Present Illness Pt underwent left TKA.  PMH:  HTN, tachycardia, former smoker, obesity    PT Comments    Pt is making good progress towards her goals. Pt is currently mod I for bed mobility, supervision for transfers with RW and ambulation of 510 feet with RW and min guard for ascend/descend of 6 steps with RW. Pt requires skilled PT to continue to progress ambulation and stair training and to improve LE strength and ROM to safely navigate her home environment at discharge.    Follow Up Recommendations  Home health PT     Equipment Recommendations  Rolling walker with 5" wheels       Precautions / Restrictions Precautions Precautions: Knee;Fall Precaution Booklet Issued: No Precaution Comments: Reviewed knee precautions during ADL including no pillow under knee  Restrictions Weight Bearing Restrictions: Yes LLE Weight Bearing: Weight bearing as tolerated    Mobility  Bed Mobility Overal bed mobility: Modified Independent             General bed mobility comments: use of bedrails to pull to side of bed  Transfers Overall transfer level: Needs assistance Equipment used: Rolling walker (2 wheeled) Transfers: Sit to/from Stand Sit to Stand: Supervision         General transfer comment: Supervision for safety  Ambulation/Gait Ambulation/Gait assistance: Min guard;Supervision Ambulation Distance (Feet): 510 Feet Assistive device: Rolling walker (2 wheeled) Gait Pattern/deviations: Step-through pattern;Trunk flexed Gait velocity: slowed Gait velocity interpretation: Below normal speed for age/gender General Gait Details: vc for upright posture and knee extension in stance phase on L, heel strike on L, RW used for stability    Stairs Stairs: Yes   Stair Management: No rails;Step to pattern;Backwards;With walker Number of Stairs:  6 General stair comments: pt exhibited safe secure ascent/descent of 6 steps, husband educated on need to steady walker       Balance Overall balance assessment: Needs assistance   Sitting balance-Leahy Scale: Good       Standing balance-Leahy Scale: Fair Standing balance comment: Able to statically stand without UE support for ADL tasks.                             Cognition Arousal/Alertness: Awake/alert Behavior During Therapy: WFL for tasks assessed/performed Overall Cognitive Status: Within Functional Limits for tasks assessed                                        Exercises Total Joint Exercises Ankle Circles/Pumps: AROM;Both;10 reps;Seated Quad Sets: AROM;Left;10 reps;Seated Gluteal Sets: AROM;Left;10 reps;Seated Long Arc Quad: AROM;Left;10 reps;Seated Knee Flexion: AROM;AAROM;Left;10 reps;Seated Goniometric ROM: 5 to 90 degrees L knee ROM measured seated in recliner    General Comments General comments (skin integrity, edema, etc.): pt husband present throughout session      Pertinent Vitals/Pain Pain Assessment: Faces Faces Pain Scale: Hurts a little bit Pain Location: Left knee Pain Descriptors / Indicators: Aching;Grimacing;Guarding;Operative site guarding Pain Intervention(s): Monitored during session;Repositioned;Ice applied;Patient requesting pain meds-RN notified  VSS           PT Goals (current goals can now be found in the care plan section) Acute Rehab PT Goals Patient Stated Goal: to go home today Potential to Achieve Goals: Good Progress towards PT  goals: Progressing toward goals    Frequency    7X/week      PT Plan Current plan remains appropriate       AM-PAC PT "6 Clicks" Daily Activity  Outcome Measure  Difficulty turning over in bed (including adjusting bedclothes, sheets and blankets)?: None Difficulty moving from lying on back to sitting on the side of the bed? : None Difficulty sitting down on  and standing up from a chair with arms (e.g., wheelchair, bedside commode, etc,.)?: None Help needed moving to and from a bed to chair (including a wheelchair)?: None Help needed walking in hospital room?: None Help needed climbing 3-5 steps with a railing? : A Little 6 Click Score: 23    End of Session Equipment Utilized During Treatment: Gait belt Activity Tolerance: Patient tolerated treatment well Patient left: in chair;with call bell/phone within reach Nurse Communication: Mobility status PT Visit Diagnosis: Unsteadiness on feet (R26.81);Muscle weakness (generalized) (M62.81);Pain Pain - Right/Left: Left Pain - part of body: Knee     Time: 0998-3382 PT Time Calculation (min) (ACUTE ONLY): 28 min  Charges:  $Gait Training: 8-22 mins $Therapeutic Exercise: 8-22 mins                    G Codes:       Dametri Ozburn B. Migdalia Dk PT, DPT Acute Rehabilitation  [phone] Pager [phone]     [place] 03/14/2017, 11:12 AM

## 2017-03-14 NOTE — Progress Notes (Signed)
Physical Therapy Treatment Patient Details Name: [name] MRN: [id number] DOB: 1961-06-02 Today's Date: 03/14/2017    History of Present Illness Pt underwent left TKA.  PMH:  HTN, tachycardia, former smoker, obesity    PT Comments    Pt is making good progress towards her goals. Pt is currently mod I for bed mobility, supervision for transfers with RW and ambulation of 550 feet with RW, and min guard for ascend/descend of 6 steps with RW. Pt requires skilled PT to continue to progress gait training and to improve LE strength, ROM and endurance to be able to safely navigate her home environment.    Follow Up Recommendations  Home health PT     Equipment Recommendations  Rolling walker with 5" wheels    Recommendations for Other Services       Precautions / Restrictions Precautions Precautions: Knee;Fall Precaution Booklet Issued: No Restrictions Weight Bearing Restrictions: Yes LLE Weight Bearing: Weight bearing as tolerated    Mobility  Bed Mobility Overal bed mobility: Modified Independent             General bed mobility comments: use of bedrails to pull to side of bed  Transfers Overall transfer level: Needs assistance Equipment used: Rolling walker (2 wheeled) Transfers: Sit to/from Stand Sit to Stand: Supervision         General transfer comment: Supervision for safety  Ambulation/Gait Ambulation/Gait assistance: Supervision Ambulation Distance (Feet): 550 Feet Assistive device: Rolling walker (2 wheeled) Gait Pattern/deviations: Step-through pattern;Trunk flexed Gait velocity: slowed   General Gait Details: pt with better foot clearance and increased heel strike    Stairs     Stair Management: No rails;Step to pattern;Backwards;With walker   General stair comments: pt exhibited safe secure ascent/descent of 6 steps, husband educated on need to steady walker      Balance Overall balance assessment: Needs assistance   Sitting  balance-Leahy Scale: Good       Standing balance-Leahy Scale: Good Standing balance comment: able to stand without support and don bathrobe without support                            Cognition Arousal/Alertness: Awake/alert Behavior During Therapy: WFL for tasks assessed/performed Overall Cognitive Status: Within Functional Limits for tasks assessed                                        Exercises Total Joint Exercises Ankle Circles/Pumps: AROM;Both;10 reps;Seated Quad Sets: AROM;Left;10 reps;Seated Gluteal Sets: AROM;Left;10 reps;Seated Long Arc Quad: AROM;Left;10 reps;Seated Knee Flexion: AROM;AAROM;Left;10 reps;Seated    General Comments General comments (skin integrity, edema, etc.): pt husband present throughout session, pt educated on proper use of bone foam to achieve good knee extension while resting      Pertinent Vitals/Pain Pain Assessment: Faces Faces Pain Scale: Hurts a little bit Pain Location: Left knee Pain Descriptors / Indicators: Aching;Grimacing;Guarding;Operative site guarding Pain Intervention(s): Monitored during session;Patient requesting pain meds-RN notified  VSS           PT Goals (current goals can now be found in the care plan section) Acute Rehab PT Goals Patient Stated Goal: to go home today Potential to Achieve Goals: Good Progress towards PT goals: Progressing toward goals    Frequency    7X/week      PT Plan Current plan remains appropriate  AM-PAC PT "6 Clicks" Daily Activity  Outcome Measure  Difficulty turning over in bed (including adjusting bedclothes, sheets and blankets)?: None Difficulty moving from lying on back to sitting on the side of the bed? : None Difficulty sitting down on and standing up from a chair with arms (e.g., wheelchair, bedside commode, etc,.)?: None Help needed moving to and from a bed to chair (including a wheelchair)?: None Help needed walking in hospital  room?: None Help needed climbing 3-5 steps with a railing? : A Little 6 Click Score: 23    End of Session Equipment Utilized During Treatment: Gait belt Activity Tolerance: Patient tolerated treatment well Patient left: in chair;with call bell/phone within reach Nurse Communication: Mobility status PT Visit Diagnosis: Unsteadiness on feet (R26.81);Muscle weakness (generalized) (M62.81);Pain Pain - Right/Left: Left Pain - part of body: Knee     Time: 9794-8016 PT Time Calculation (min) (ACUTE ONLY): 22 min  Charges:  $Gait Training: 8-22 mins                    G Codes:       Aloma Boch B. Migdalia Dk PT, DPT Acute Rehabilitation  [phone] Pager [phone]     [place] 03/14/2017, 4:34 PM

## 2017-03-15 DIAGNOSIS — Z96652 Presence of left artificial knee joint: Secondary | ICD-10-CM | POA: Diagnosis not present

## 2017-03-15 DIAGNOSIS — M1712 Unilateral primary osteoarthritis, left knee: Secondary | ICD-10-CM | POA: Diagnosis not present

## 2017-03-16 ENCOUNTER — Encounter (HOSPITAL_COMMUNITY): Payer: Self-pay | Admitting: Emergency Medicine

## 2017-03-16 ENCOUNTER — Inpatient Hospital Stay (HOSPITAL_COMMUNITY)
Admission: EM | Admit: 2017-03-16 | Discharge: 2017-03-23 | DRG: 854 | Disposition: A | Discharge status: Rehabilitation Facility | Payer: 59 | Attending: Surgery | Admitting: Surgery

## 2017-03-16 DIAGNOSIS — K5720 Diverticulitis of large intestine with perforation and abscess without bleeding: Secondary | ICD-10-CM | POA: Diagnosis present

## 2017-03-16 DIAGNOSIS — Z79899 Other long term (current) drug therapy: Secondary | ICD-10-CM

## 2017-03-16 DIAGNOSIS — R739 Hyperglycemia, unspecified: Secondary | ICD-10-CM

## 2017-03-16 DIAGNOSIS — G8918 Other acute postprocedural pain: Secondary | ICD-10-CM

## 2017-03-16 DIAGNOSIS — F419 Anxiety disorder, unspecified: Secondary | ICD-10-CM | POA: Diagnosis present

## 2017-03-16 DIAGNOSIS — Z48815 Encounter for surgical aftercare following surgery on the digestive system: Secondary | ICD-10-CM | POA: Diagnosis not present

## 2017-03-16 DIAGNOSIS — R000 Tachycardia, unspecified: Secondary | ICD-10-CM | POA: Diagnosis not present

## 2017-03-16 DIAGNOSIS — A419 Sepsis, unspecified organism: Secondary | ICD-10-CM | POA: Diagnosis not present

## 2017-03-16 DIAGNOSIS — C801 Malignant (primary) neoplasm, unspecified: Secondary | ICD-10-CM | POA: Diagnosis present

## 2017-03-16 DIAGNOSIS — Z87891 Personal history of nicotine dependence: Secondary | ICD-10-CM

## 2017-03-16 DIAGNOSIS — D72829 Elevated white blood cell count, unspecified: Secondary | ICD-10-CM

## 2017-03-16 DIAGNOSIS — K5780 Diverticulitis of intestine, part unspecified, with perforation and abscess without bleeding: Secondary | ICD-10-CM

## 2017-03-16 DIAGNOSIS — Z96652 Presence of left artificial knee joint: Secondary | ICD-10-CM | POA: Diagnosis not present

## 2017-03-16 DIAGNOSIS — Z8579 Personal history of other malignant neoplasms of lymphoid, hematopoietic and related tissues: Secondary | ICD-10-CM

## 2017-03-16 DIAGNOSIS — Z8572 Personal history of non-Hodgkin lymphomas: Secondary | ICD-10-CM

## 2017-03-16 DIAGNOSIS — I10 Essential (primary) hypertension: Secondary | ICD-10-CM | POA: Diagnosis present

## 2017-03-16 DIAGNOSIS — K5730 Diverticulosis of large intestine without perforation or abscess without bleeding: Secondary | ICD-10-CM | POA: Diagnosis not present

## 2017-03-16 DIAGNOSIS — Z7982 Long term (current) use of aspirin: Secondary | ICD-10-CM

## 2017-03-16 DIAGNOSIS — Z9109 Other allergy status, other than to drugs and biological substances: Secondary | ICD-10-CM

## 2017-03-16 DIAGNOSIS — E860 Dehydration: Secondary | ICD-10-CM | POA: Diagnosis not present

## 2017-03-16 DIAGNOSIS — E039 Hypothyroidism, unspecified: Secondary | ICD-10-CM | POA: Diagnosis present

## 2017-03-16 DIAGNOSIS — Z471 Aftercare following joint replacement surgery: Secondary | ICD-10-CM | POA: Diagnosis not present

## 2017-03-16 DIAGNOSIS — M1711 Unilateral primary osteoarthritis, right knee: Secondary | ICD-10-CM | POA: Diagnosis not present

## 2017-03-16 HISTORY — DX: Presence of left artificial knee joint: Z96.652

## 2017-03-16 NOTE — ED Triage Notes (Signed)
Reports having total knee replacement on Monday.  Here tonight c/o LLQ abdominal pain with distention and gas.  Reports last bm yesterday but was small.  Noted to be tachy at 133 in triage and febrile at 102.5 Orally.

## 2017-03-17 ENCOUNTER — Emergency Department (HOSPITAL_COMMUNITY): Payer: 59 | Admitting: Anesthesiology

## 2017-03-17 ENCOUNTER — Encounter (HOSPITAL_COMMUNITY): Admission: EM | Disposition: A | Discharge status: Rehabilitation Facility | Payer: Self-pay | Source: Home / Self Care

## 2017-03-17 ENCOUNTER — Encounter (HOSPITAL_COMMUNITY): Payer: Self-pay | Admitting: Anesthesiology

## 2017-03-17 ENCOUNTER — Emergency Department (HOSPITAL_COMMUNITY): Payer: 59

## 2017-03-17 DIAGNOSIS — G8918 Other acute postprocedural pain: Secondary | ICD-10-CM | POA: Diagnosis not present

## 2017-03-17 DIAGNOSIS — R1032 Left lower quadrant pain: Secondary | ICD-10-CM | POA: Diagnosis not present

## 2017-03-17 DIAGNOSIS — K659 Peritonitis, unspecified: Secondary | ICD-10-CM | POA: Diagnosis not present

## 2017-03-17 DIAGNOSIS — E876 Hypokalemia: Secondary | ICD-10-CM | POA: Diagnosis not present

## 2017-03-17 DIAGNOSIS — K631 Perforation of intestine (nontraumatic): Secondary | ICD-10-CM | POA: Diagnosis not present

## 2017-03-17 DIAGNOSIS — J9811 Atelectasis: Secondary | ICD-10-CM | POA: Diagnosis not present

## 2017-03-17 DIAGNOSIS — Z8572 Personal history of non-Hodgkin lymphomas: Secondary | ICD-10-CM | POA: Diagnosis not present

## 2017-03-17 DIAGNOSIS — R5381 Other malaise: Secondary | ICD-10-CM | POA: Diagnosis not present

## 2017-03-17 DIAGNOSIS — Z9109 Other allergy status, other than to drugs and biological substances: Secondary | ICD-10-CM | POA: Diagnosis not present

## 2017-03-17 DIAGNOSIS — Z87891 Personal history of nicotine dependence: Secondary | ICD-10-CM | POA: Diagnosis not present

## 2017-03-17 DIAGNOSIS — Z7982 Long term (current) use of aspirin: Secondary | ICD-10-CM | POA: Diagnosis not present

## 2017-03-17 DIAGNOSIS — D72829 Elevated white blood cell count, unspecified: Secondary | ICD-10-CM | POA: Diagnosis not present

## 2017-03-17 DIAGNOSIS — Z96652 Presence of left artificial knee joint: Secondary | ICD-10-CM | POA: Diagnosis not present

## 2017-03-17 DIAGNOSIS — K5720 Diverticulitis of large intestine with perforation and abscess without bleeding: Secondary | ICD-10-CM | POA: Diagnosis present

## 2017-03-17 DIAGNOSIS — Z471 Aftercare following joint replacement surgery: Secondary | ICD-10-CM | POA: Diagnosis not present

## 2017-03-17 DIAGNOSIS — R509 Fever, unspecified: Secondary | ICD-10-CM | POA: Diagnosis not present

## 2017-03-17 DIAGNOSIS — K5732 Diverticulitis of large intestine without perforation or abscess without bleeding: Secondary | ICD-10-CM | POA: Diagnosis not present

## 2017-03-17 DIAGNOSIS — F419 Anxiety disorder, unspecified: Secondary | ICD-10-CM | POA: Diagnosis present

## 2017-03-17 DIAGNOSIS — D62 Acute posthemorrhagic anemia: Secondary | ICD-10-CM | POA: Diagnosis not present

## 2017-03-17 DIAGNOSIS — Z79899 Other long term (current) drug therapy: Secondary | ICD-10-CM | POA: Diagnosis not present

## 2017-03-17 DIAGNOSIS — N200 Calculus of kidney: Secondary | ICD-10-CM | POA: Diagnosis not present

## 2017-03-17 DIAGNOSIS — Z8579 Personal history of other malignant neoplasms of lymphoid, hematopoietic and related tissues: Secondary | ICD-10-CM | POA: Diagnosis not present

## 2017-03-17 DIAGNOSIS — J189 Pneumonia, unspecified organism: Secondary | ICD-10-CM | POA: Diagnosis not present

## 2017-03-17 DIAGNOSIS — R739 Hyperglycemia, unspecified: Secondary | ICD-10-CM | POA: Diagnosis not present

## 2017-03-17 DIAGNOSIS — E039 Hypothyroidism, unspecified: Secondary | ICD-10-CM | POA: Diagnosis present

## 2017-03-17 DIAGNOSIS — A419 Sepsis, unspecified organism: Secondary | ICD-10-CM | POA: Diagnosis not present

## 2017-03-17 DIAGNOSIS — I10 Essential (primary) hypertension: Secondary | ICD-10-CM | POA: Diagnosis not present

## 2017-03-17 DIAGNOSIS — R5081 Fever presenting with conditions classified elsewhere: Secondary | ICD-10-CM | POA: Diagnosis not present

## 2017-03-17 DIAGNOSIS — K5780 Diverticulitis of intestine, part unspecified, with perforation and abscess without bleeding: Secondary | ICD-10-CM | POA: Diagnosis not present

## 2017-03-17 DIAGNOSIS — Z933 Colostomy status: Secondary | ICD-10-CM | POA: Diagnosis not present

## 2017-03-17 HISTORY — PX: COLON RESECTION SIGMOID: SHX6737

## 2017-03-17 LAB — CBC WITH DIFFERENTIAL/PLATELET
BASOS PCT: 0 %
Basophils Absolute: 0 10*3/uL (ref 0.0–0.1)
EOS ABS: 0 10*3/uL (ref 0.0–0.7)
Eosinophils Relative: 0 %
HCT: 37.4 % (ref 36.0–46.0)
Hemoglobin: 12.3 g/dL (ref 12.0–15.0)
LYMPHS ABS: 1.7 10*3/uL (ref 0.7–4.0)
Lymphocytes Relative: 8 %
MCH: 31.5 pg (ref 26.0–34.0)
MCHC: 32.9 g/dL (ref 30.0–36.0)
MCV: 95.7 fL (ref 78.0–100.0)
Monocytes Absolute: 0.5 10*3/uL (ref 0.1–1.0)
Monocytes Relative: 2 %
NEUTROS PCT: 90 %
Neutro Abs: 19.8 10*3/uL — ABNORMAL HIGH (ref 1.7–7.7)
Platelets: 223 10*3/uL (ref 150–400)
RBC: 3.91 MIL/uL (ref 3.87–5.11)
RDW: 14.3 % (ref 11.5–15.5)
WBC: 22 10*3/uL — AB (ref 4.0–10.5)

## 2017-03-17 LAB — BASIC METABOLIC PANEL
ANION GAP: 6 (ref 5–15)
BUN: 7 mg/dL (ref 6–20)
CALCIUM: 7.3 mg/dL — AB (ref 8.9–10.3)
CO2: 23 mmol/L (ref 22–32)
CREATININE: 0.67 mg/dL (ref 0.44–1.00)
Chloride: 107 mmol/L (ref 101–111)
GFR calc Af Amer: >60 mL/min (ref >60)
GFR calc non Af Amer: >60 mL/min (ref >60)
GLUCOSE: 136 mg/dL — AB (ref 65–99)
POTASSIUM: 3.9 mmol/L (ref 3.5–5.1)
Sodium: 136 mmol/L (ref 135–145)

## 2017-03-17 LAB — URINALYSIS, ROUTINE W REFLEX MICROSCOPIC
BACTERIA UA: NONE SEEN
Bilirubin Urine: NEGATIVE
GLUCOSE, UA: NEGATIVE mg/dL
Ketones, ur: NEGATIVE mg/dL
Leukocytes, UA: NEGATIVE
Nitrite: NEGATIVE
PH: 7 (ref 5.0–8.0)
PROTEIN: NEGATIVE mg/dL
Specific Gravity, Urine: 1.013 (ref 1.005–1.030)

## 2017-03-17 LAB — I-STAT CG4 LACTIC ACID, ED
Lactic Acid, Venous: 0.83 mmol/L (ref 0.5–1.9)
Lactic Acid, Venous: 1.68 mmol/L (ref 0.5–1.9)

## 2017-03-17 LAB — COMPREHENSIVE METABOLIC PANEL
ALK PHOS: 52 U/L (ref 38–126)
ALT: 44 U/L (ref 14–54)
ANION GAP: 11 (ref 5–15)
AST: 39 U/L (ref 15–41)
Albumin: 3.3 g/dL — ABNORMAL LOW (ref 3.5–5.0)
BUN: 12 mg/dL (ref 6–20)
CALCIUM: 8.7 mg/dL — AB (ref 8.9–10.3)
CHLORIDE: 99 mmol/L — AB (ref 101–111)
CO2: 24 mmol/L (ref 22–32)
Creatinine, Ser: 0.76 mg/dL (ref 0.44–1.00)
GFR calc Af Amer: >60 mL/min (ref >60)
GFR calc non Af Amer: >60 mL/min (ref >60)
Glucose, Bld: 166 mg/dL — ABNORMAL HIGH (ref 65–99)
Potassium: 3.9 mmol/L (ref 3.5–5.1)
SODIUM: 134 mmol/L — AB (ref 135–145)
Total Bilirubin: 0.9 mg/dL (ref 0.3–1.2)
Total Protein: 7.2 g/dL (ref 6.5–8.1)

## 2017-03-17 LAB — CBC
HEMATOCRIT: 32.3 % — AB (ref 36.0–46.0)
Hemoglobin: 10.3 g/dL — ABNORMAL LOW (ref 12.0–15.0)
MCH: 31 pg (ref 26.0–34.0)
MCHC: 31.9 g/dL (ref 30.0–36.0)
MCV: 97.3 fL (ref 78.0–100.0)
Platelets: 180 10*3/uL (ref 150–400)
RBC: 3.32 MIL/uL — AB (ref 3.87–5.11)
RDW: 14.5 % (ref 11.5–15.5)
WBC: 17.9 10*3/uL — ABNORMAL HIGH (ref 4.0–10.5)

## 2017-03-17 LAB — I-STAT BETA HCG BLOOD, ED (MC, WL, AP ONLY): HCG, QUANTITATIVE: 7.6 m[IU]/mL — AB (ref <5)

## 2017-03-17 LAB — LIPASE, BLOOD: Lipase: 16 U/L (ref 11–51)

## 2017-03-17 LAB — PROTIME-INR
INR: 1.08
PROTHROMBIN TIME: 14 s (ref 11.4–15.2)

## 2017-03-17 SURGERY — COLECTOMY, SIGMOID, OPEN
Anesthesia: General | Site: Abdomen

## 2017-03-17 MED ORDER — BARIUM SULFATE 2.1 % PO SUSP
ORAL | Status: AC
  Filled 2017-03-17: qty 2

## 2017-03-17 MED ORDER — LIDOCAINE HCL (CARDIAC) 20 MG/ML IV SOLN
INTRAVENOUS | Status: DC | PRN
  Administered 2017-03-17: 60 mg via INTRATRACHEAL

## 2017-03-17 MED ORDER — DIPHENHYDRAMINE HCL 50 MG/ML IJ SOLN: 12.5000 mg | Freq: Four times a day (QID) | INTRAMUSCULAR | Status: DC | PRN

## 2017-03-17 MED ORDER — PIPERACILLIN-TAZOBACTAM 3.375 G IVPB
3.3750 g | Freq: Three times a day (TID) | INTRAVENOUS | Status: DC
  Administered 2017-03-17 – 2017-03-23 (×17): 3.375 g via INTRAVENOUS
  Filled 2017-03-17 (×18): qty 50

## 2017-03-17 MED ORDER — SODIUM CHLORIDE 0.9 % IV SOLN
1000.0000 mL | INTRAVENOUS | Status: DC
  Administered 2017-03-17: 1000 mL via INTRAVENOUS

## 2017-03-17 MED ORDER — ONDANSETRON 4 MG PO TBDP: 4.0000 mg | ORAL_TABLET | Freq: Four times a day (QID) | ORAL | Status: DC | PRN

## 2017-03-17 MED ORDER — ACETAMINOPHEN 650 MG RE SUPP: 650.0000 mg | Freq: Four times a day (QID) | RECTAL | Status: DC | PRN

## 2017-03-17 MED ORDER — HYDROMORPHONE 1 MG/ML IV SOLN
INTRAVENOUS | Status: DC
  Administered 2017-03-17: 1.8 mg via INTRAVENOUS
  Administered 2017-03-17: 3 mg via INTRAVENOUS
  Administered 2017-03-17: 1.5 mg via INTRAVENOUS
  Administered 2017-03-17: 25 mg via INTRAVENOUS
  Administered 2017-03-18: 1.8 mg via INTRAVENOUS
  Administered 2017-03-18: 2.1 mg via INTRAVENOUS
  Administered 2017-03-18: 1 mg via INTRAVENOUS
  Administered 2017-03-18: 2.4 mg via INTRAVENOUS
  Administered 2017-03-18: 1.5 mg via INTRAVENOUS
  Administered 2017-03-18: 0.9 mg via INTRAVENOUS
  Administered 2017-03-19: 2.1 mg via INTRAVENOUS
  Administered 2017-03-19: 0.6 mg via INTRAVENOUS
  Administered 2017-03-19: 3 mg via INTRAVENOUS
  Administered 2017-03-19: 25 mg via INTRAVENOUS
  Administered 2017-03-19: 1.5 mg via INTRAVENOUS
  Administered 2017-03-20 (×2): 0.9 mg via INTRAVENOUS
  Filled 2017-03-17 (×3): qty 25

## 2017-03-17 MED ORDER — HYDROMORPHONE HCL 1 MG/ML IJ SOLN
INTRAMUSCULAR | Status: AC
  Administered 2017-03-17: 0.5 mg via INTRAVENOUS
  Filled 2017-03-17: qty 1

## 2017-03-17 MED ORDER — PRAVASTATIN SODIUM 10 MG PO TABS
20.0000 mg | ORAL_TABLET | Freq: Every evening | ORAL | Status: DC
  Administered 2017-03-17 – 2017-03-22 (×6): 20 mg via ORAL
  Filled 2017-03-17 (×6): qty 2

## 2017-03-17 MED ORDER — HYDRALAZINE HCL 20 MG/ML IJ SOLN: 10.0000 mg | INTRAMUSCULAR | Status: DC | PRN

## 2017-03-17 MED ORDER — SODIUM CHLORIDE 0.9 % IV SOLN
2000.0000 mg | Freq: Once | INTRAVENOUS | Status: AC
  Administered 2017-03-17: 2000 mg via INTRAVENOUS
  Filled 2017-03-17: qty 2000

## 2017-03-17 MED ORDER — ENOXAPARIN SODIUM 40 MG/0.4ML ~~LOC~~ SOLN
40.0000 mg | SUBCUTANEOUS | Status: DC
  Administered 2017-03-18 – 2017-03-20 (×3): 40 mg via SUBCUTANEOUS
  Filled 2017-03-17 (×3): qty 0.4

## 2017-03-17 MED ORDER — ROCURONIUM BROMIDE 100 MG/10ML IV SOLN
INTRAVENOUS | Status: DC | PRN
  Administered 2017-03-17: 20 mg via INTRAVENOUS
  Administered 2017-03-17: 50 mg via INTRAVENOUS
  Administered 2017-03-17: 20 mg via INTRAVENOUS

## 2017-03-17 MED ORDER — PHENYLEPHRINE HCL 10 MG/ML IJ SOLN
INTRAMUSCULAR | Status: DC | PRN
  Administered 2017-03-17: 120 ug via INTRAVENOUS
  Administered 2017-03-17: 80 ug via INTRAVENOUS
  Administered 2017-03-17: 120 ug via INTRAVENOUS
  Administered 2017-03-17 (×3): 80 ug via INTRAVENOUS

## 2017-03-17 MED ORDER — VANCOMYCIN HCL IN DEXTROSE 750-5 MG/150ML-% IV SOLN
750.0000 mg | Freq: Two times a day (BID) | INTRAVENOUS | Status: DC
  Administered 2017-03-17 – 2017-03-19 (×5): 750 mg via INTRAVENOUS
  Filled 2017-03-17 (×6): qty 150

## 2017-03-17 MED ORDER — PROMETHAZINE HCL 25 MG/ML IJ SOLN: 6.2500 mg | INTRAMUSCULAR | Status: DC | PRN

## 2017-03-17 MED ORDER — MIDAZOLAM HCL 5 MG/5ML IJ SOLN
INTRAMUSCULAR | Status: DC | PRN
  Administered 2017-03-17: 1 mg via INTRAVENOUS

## 2017-03-17 MED ORDER — AMLODIPINE BESYLATE 10 MG PO TABS
10.0000 mg | ORAL_TABLET | Freq: Every evening | ORAL | Status: DC
  Administered 2017-03-19 – 2017-03-22 (×4): 10 mg via ORAL
  Filled 2017-03-17 (×5): qty 1

## 2017-03-17 MED ORDER — SUGAMMADEX SODIUM 200 MG/2ML IV SOLN
INTRAVENOUS | Status: DC | PRN
  Administered 2017-03-17: 200 mg via INTRAVENOUS

## 2017-03-17 MED ORDER — BENAZEPRIL HCL 20 MG PO TABS
20.0000 mg | ORAL_TABLET | Freq: Every day | ORAL | Status: DC
  Administered 2017-03-19 – 2017-03-23 (×5): 20 mg via ORAL
  Filled 2017-03-17 (×7): qty 1

## 2017-03-17 MED ORDER — VANCOMYCIN HCL IN DEXTROSE 1-5 GM/200ML-% IV SOLN
1000.0000 mg | Freq: Once | INTRAVENOUS | Status: DC
  Filled 2017-03-17: qty 200

## 2017-03-17 MED ORDER — FENTANYL CITRATE (PF) 250 MCG/5ML IJ SOLN
INTRAMUSCULAR | Status: DC | PRN
  Administered 2017-03-17 (×5): 50 ug via INTRAVENOUS

## 2017-03-17 MED ORDER — LEVOTHYROXINE SODIUM 88 MCG PO TABS
88.0000 ug | ORAL_TABLET | Freq: Every day | ORAL | Status: DC
  Administered 2017-03-18 – 2017-03-23 (×6): 88 ug via ORAL
  Filled 2017-03-17 (×6): qty 1

## 2017-03-17 MED ORDER — ONDANSETRON HCL 4 MG/2ML IJ SOLN: 4.0000 mg | Freq: Four times a day (QID) | INTRAMUSCULAR | Status: DC | PRN

## 2017-03-17 MED ORDER — MEPERIDINE HCL 25 MG/ML IJ SOLN: 6.2500 mg | INTRAMUSCULAR | Status: DC | PRN

## 2017-03-17 MED ORDER — ONDANSETRON HCL 4 MG/2ML IJ SOLN
INTRAMUSCULAR | Status: AC
  Filled 2017-03-17: qty 2

## 2017-03-17 MED ORDER — METHOCARBAMOL 500 MG PO TABS
500.0000 mg | ORAL_TABLET | Freq: Four times a day (QID) | ORAL | Status: DC | PRN
  Administered 2017-03-19 – 2017-03-21 (×3): 500 mg via ORAL
  Filled 2017-03-17 (×3): qty 1

## 2017-03-17 MED ORDER — ALBUMIN HUMAN 5 % IV SOLN
INTRAVENOUS | Status: DC | PRN
  Administered 2017-03-17: 07:00:00 via INTRAVENOUS

## 2017-03-17 MED ORDER — SUGAMMADEX SODIUM 200 MG/2ML IV SOLN
INTRAVENOUS | Status: AC
  Filled 2017-03-17: qty 2

## 2017-03-17 MED ORDER — SODIUM CHLORIDE 0.9 % IV BOLUS (SEPSIS)
500.0000 mL | Freq: Once | INTRAVENOUS | Status: AC
  Administered 2017-03-17: 500 mL via INTRAVENOUS

## 2017-03-17 MED ORDER — SODIUM CHLORIDE 0.9 % IV BOLUS (SEPSIS)
1000.0000 mL | Freq: Once | INTRAVENOUS | Status: AC
  Administered 2017-03-17: 1000 mL via INTRAVENOUS

## 2017-03-17 MED ORDER — NALOXONE HCL 0.4 MG/ML IJ SOLN: 0.4000 mg | INTRAMUSCULAR | Status: DC | PRN

## 2017-03-17 MED ORDER — DIPHENHYDRAMINE HCL 12.5 MG/5ML PO ELIX: 12.5000 mg | ORAL_SOLUTION | Freq: Four times a day (QID) | ORAL | Status: DC | PRN

## 2017-03-17 MED ORDER — LACTATED RINGERS IV SOLN
INTRAVENOUS | Status: DC | PRN
  Administered 2017-03-17 (×3): via INTRAVENOUS

## 2017-03-17 MED ORDER — ONDANSETRON 4 MG PO TBDP
8.0000 mg | ORAL_TABLET | Freq: Once | ORAL | Status: AC
  Administered 2017-03-17: 8 mg via ORAL
  Filled 2017-03-17: qty 2

## 2017-03-17 MED ORDER — PROPOFOL 10 MG/ML IV BOLUS
INTRAVENOUS | Status: DC | PRN
  Administered 2017-03-17: 160 mg via INTRAVENOUS
  Administered 2017-03-17: 50 mg via INTRAVENOUS

## 2017-03-17 MED ORDER — SUCCINYLCHOLINE CHLORIDE 20 MG/ML IJ SOLN
INTRAMUSCULAR | Status: DC | PRN
  Administered 2017-03-17 (×2): 100 mg via INTRAVENOUS

## 2017-03-17 MED ORDER — HYDROMORPHONE HCL 1 MG/ML IJ SOLN
0.2500 mg | INTRAMUSCULAR | Status: DC | PRN
  Administered 2017-03-17 (×2): 0.5 mg via INTRAVENOUS

## 2017-03-17 MED ORDER — ACETAMINOPHEN 325 MG PO TABS
650.0000 mg | ORAL_TABLET | Freq: Four times a day (QID) | ORAL | Status: DC | PRN
  Administered 2017-03-22: 650 mg via ORAL
  Filled 2017-03-17: qty 2

## 2017-03-17 MED ORDER — ONDANSETRON HCL 4 MG/2ML IJ SOLN
INTRAMUSCULAR | Status: DC | PRN
  Administered 2017-03-17: 4 mg via INTRAVENOUS

## 2017-03-17 MED ORDER — POTASSIUM CHLORIDE IN NACL 20-0.9 MEQ/L-% IV SOLN
INTRAVENOUS | Status: DC
  Administered 2017-03-17 – 2017-03-23 (×9): via INTRAVENOUS
  Filled 2017-03-17 (×10): qty 1000

## 2017-03-17 MED ORDER — LORATADINE 10 MG PO TABS
10.0000 mg | ORAL_TABLET | Freq: Every day | ORAL | Status: DC
  Administered 2017-03-18 – 2017-03-23 (×6): 10 mg via ORAL
  Filled 2017-03-17 (×7): qty 1

## 2017-03-17 MED ORDER — FENTANYL CITRATE (PF) 250 MCG/5ML IJ SOLN
INTRAMUSCULAR | Status: AC
  Filled 2017-03-17: qty 5

## 2017-03-17 MED ORDER — SODIUM CHLORIDE 0.9% FLUSH
9.0000 mL | INTRAVENOUS | Status: DC | PRN
Start: 2017-03-17 — End: 2017-03-23

## 2017-03-17 MED ORDER — FENTANYL CITRATE (PF) 100 MCG/2ML IJ SOLN
50.0000 ug | Freq: Once | INTRAMUSCULAR | Status: AC
  Administered 2017-03-17: 50 ug via INTRAVENOUS

## 2017-03-17 MED ORDER — FENTANYL CITRATE (PF) 100 MCG/2ML IJ SOLN
50.0000 ug | Freq: Once | INTRAMUSCULAR | Status: AC
  Administered 2017-03-17: 50 ug via INTRAVENOUS
  Filled 2017-03-17: qty 2

## 2017-03-17 MED ORDER — ACETAMINOPHEN 500 MG PO TABS
1000.0000 mg | ORAL_TABLET | Freq: Once | ORAL | Status: AC
  Administered 2017-03-17: 1000 mg via ORAL
  Filled 2017-03-17: qty 2

## 2017-03-17 MED ORDER — POLYETHYLENE GLYCOL 3350 17 G PO PACK
17.0000 g | PACK | Freq: Every day | ORAL | Status: DC
  Administered 2017-03-17: 17 g via ORAL
  Filled 2017-03-17: qty 1

## 2017-03-17 MED ORDER — 0.9 % SODIUM CHLORIDE (POUR BTL) OPTIME
TOPICAL | Status: DC | PRN
  Administered 2017-03-17: 6000 mL

## 2017-03-17 MED ORDER — PIPERACILLIN-TAZOBACTAM 3.375 G IVPB 30 MIN
3.3750 g | Freq: Once | INTRAVENOUS | Status: AC
  Administered 2017-03-17: 3.375 g via INTRAVENOUS
  Filled 2017-03-17: qty 50

## 2017-03-17 MED ORDER — MIDAZOLAM HCL 2 MG/2ML IJ SOLN
INTRAMUSCULAR | Status: AC
  Filled 2017-03-17: qty 2

## 2017-03-17 SURGICAL SUPPLY — 58 items
BLADE CLIPPER SURG (BLADE) IMPLANT
CANISTER SUCT 3000ML PPV (MISCELLANEOUS) ×3 IMPLANT
CANISTER WOUND CARE 500ML ATS (WOUND CARE) ×3 IMPLANT
CHLORAPREP W/TINT 26ML (MISCELLANEOUS) ×3 IMPLANT
COVER MAYO STAND STRL (DRAPES) ×6 IMPLANT
COVER SURGICAL LIGHT HANDLE (MISCELLANEOUS) ×3 IMPLANT
DRAPE HALF SHEET 40X57 (DRAPES) ×3 IMPLANT
DRAPE LAPAROSCOPIC ABDOMINAL (DRAPES) ×3 IMPLANT
DRAPE UTILITY XL STRL (DRAPES) ×3 IMPLANT
DRAPE WARM FLUID 44X44 (DRAPE) ×3 IMPLANT
DRSG OPSITE POSTOP 4X10 (GAUZE/BANDAGES/DRESSINGS) IMPLANT
DRSG OPSITE POSTOP 4X8 (GAUZE/BANDAGES/DRESSINGS) IMPLANT
DRSG VAC ATS MED SENSATRAC (GAUZE/BANDAGES/DRESSINGS) ×3 IMPLANT
ELECT BLADE 6.5 EXT (BLADE) ×3 IMPLANT
ELECT CAUTERY BLADE 6.4 (BLADE) ×6 IMPLANT
ELECT REM PT RETURN 9FT ADLT (ELECTROSURGICAL) ×3
ELECTRODE REM PT RTRN 9FT ADLT (ELECTROSURGICAL) ×1 IMPLANT
GLOVE BIO SURGEON STRL SZ7 (GLOVE) ×6 IMPLANT
GLOVE BIO SURGEON STRL SZ7.5 (GLOVE) ×9 IMPLANT
GLOVE BIO SURGEON STRL SZ8 (GLOVE) ×6 IMPLANT
GLOVE BIOGEL PI IND STRL 7.5 (GLOVE) ×2 IMPLANT
GLOVE BIOGEL PI INDICATOR 7.5 (GLOVE) ×4
GOWN STRL REUS W/ TWL LRG LVL3 (GOWN DISPOSABLE) ×6 IMPLANT
GOWN STRL REUS W/TWL LRG LVL3 (GOWN DISPOSABLE) ×12
KIT BASIN OR (CUSTOM PROCEDURE TRAY) ×3 IMPLANT
KIT OSTOMY DRAINABLE 2.75 STR (WOUND CARE) ×3 IMPLANT
KIT ROOM TURNOVER OR (KITS) ×3 IMPLANT
LEGGING LITHOTOMY PAIR STRL (DRAPES) ×3 IMPLANT
LIGASURE IMPACT 36 18CM CVD LR (INSTRUMENTS) IMPLANT
NS IRRIG 1000ML POUR BTL (IV SOLUTION) ×18 IMPLANT
PACK GENERAL/GYN (CUSTOM PROCEDURE TRAY) ×3 IMPLANT
PAD ARMBOARD 7.5X6 YLW CONV (MISCELLANEOUS) ×3 IMPLANT
PENCIL BUTTON HOLSTER BLD 10FT (ELECTRODE) ×3 IMPLANT
RELOAD STAPLER LINE PROX 60 GR (STAPLE) ×1 IMPLANT
SPECIMEN JAR LARGE (MISCELLANEOUS) ×3 IMPLANT
SPONGE LAP 18X18 X RAY DECT (DISPOSABLE) ×3 IMPLANT
STAPLER CUT CVD 40MM GREEN (STAPLE) ×6 IMPLANT
STAPLER CUT RELOAD GREEN (STAPLE) ×3 IMPLANT
STAPLER RELOAD LINE PROX 60 GR (STAPLE) ×3
STAPLER VISISTAT 35W (STAPLE) ×3 IMPLANT
SUCTION POOLE TIP (SUCTIONS) ×3 IMPLANT
SURGILUBE 2OZ TUBE FLIPTOP (MISCELLANEOUS) ×3 IMPLANT
SUT PDS AB 1 TP1 96 (SUTURE) ×6 IMPLANT
SUT PROLENE 2 0 CT2 30 (SUTURE) ×3 IMPLANT
SUT PROLENE 2 0 KS (SUTURE) IMPLANT
SUT SILK 2 0 SH CR/8 (SUTURE) ×3 IMPLANT
SUT SILK 2 0 TIES 10X30 (SUTURE) ×3 IMPLANT
SUT SILK 3 0 SH CR/8 (SUTURE) ×6 IMPLANT
SUT SILK 3 0 TIES 10X30 (SUTURE) ×3 IMPLANT
SUT VIC AB 3-0 SH 18 (SUTURE) ×3 IMPLANT
SYR BULB IRRIGATION 50ML (SYRINGE) ×3 IMPLANT
TOWEL OR 17X26 10 PK STRL BLUE (TOWEL DISPOSABLE) ×6 IMPLANT
TRAY FOLEY W/METER SILVER 16FR (SET/KITS/TRAYS/PACK) ×3 IMPLANT
TRAY PROCTOSCOPIC FIBER OPTIC (SET/KITS/TRAYS/PACK) ×3 IMPLANT
TUBE CONNECTING 12'X1/4 (SUCTIONS) ×1
TUBE CONNECTING 12X1/4 (SUCTIONS) ×2 IMPLANT
UNDERPAD 30X30 (UNDERPADS AND DIAPERS) ×3 IMPLANT
YANKAUER SUCT BULB TIP NO VENT (SUCTIONS) ×3 IMPLANT

## 2017-03-17 NOTE — Op Note (Addendum)
Preop diagnosis: Peritonitis secondary to perforated sigmoid diverticulitis Postop diagnosis: Same Procedure performed: Exploratory laparotomy, sigmoid colectomy, creation of Hartman's pouch, creation of descending colostomy, placement of wound VAC 20 x 3 cm Surgeon:Devanta Daniel K. Asst.: Dr. Ralene Ok Anesthesia: Gen. endotracheal Indications: This is a 56 year old female who is 5 days status post total knee replacement. She has been fairly constipated since surgery secondary to pain medication. She began developing significant left lower quadrant abdominal pain. This progressed to the point where she proceeded to the emergency department. She was found to have free intraperitoneal air. CT scan showed thickening of the distal sigmoid colon with signs of perforation but no contained abscess. The patient was showing signs of sepsis with fever and tachycardia. We were asked to consult on the patient. Upon examination she did have peritonitis and signs of sepsis. We proceeded directly to the operating room.  Description of procedure:  The patient is brought to the operating room and placed in the supine position on the operating room table. After an adequate level of general anesthesia was obtained, a Foley catheter was placed under sterile technique. The patient's abdomen was prepped with ChloraPrep and draped sterile fashion. A timeout was taken to ensure the proper patient and proper procedure. We made a midline incision and dissected down through a considerable amount of adipose tissue to the fascia. We open the fascia the linea alba and into the peritoneal cavity. We encountered some murky purulent fluid in the lower abdomen. The omentum is adherent to the posterior surface of the anterior abdominal wall in the midline. We took this down with cautery. The Bookwalter retractor was used to provide exposure. The left lower quadrant contains large amount of purulent fluid. We suctioned this out. The mid  sigmoid colon is fairly thickened. There is no sign of stool in the left lower quadrant. The site of perforation is not clearly identified. However the distal sigmoid colon is soft and appears normal. We dissected around the sigmoid colon distally circumferentially. We divided with a green Contour stapler. We then began dissecting the mesentery of the sigmoid colon in retrograde fashion. We oversewed some larger vessels with 2-0 silk. We continued dissecting proximally until we encountered fairly normal diameter descending colon. We divided again with the green Contour stapler. The specimen was sent for pathologic examination. We irrigated thoroughly. The staple line of the distal stump is marked with 2-0 Prolene. We mobilized the proximal stump by taking down the white line of Toldt superiorly the patient has a very thick abdominal wall so we tried to mobilize as much as we could. We irrigated the abdomen thoroughly with several liters of warm saline. The irrigation was clear. No bleeding was noted. We created a ostomy on the left side at the level of the umbilicus. We excised the area of skin and subcutaneous fat. We incised the fascia. We exteriorized the staple line of the descending colon. We then closed our fascia with double- stranded #1 PDS suture. The subcutaneous tissues were irrigated and a medium VAC sponge was cut to fit the wound. This was sealed with an occlusive drape and placed to 1 25 mmHg suction. The colostomy was then matured with 3-0 Vicryl. An ostomy appliance was attached. The patient was then extubated and brought to the recovery room in stable condition. All sponge, instrument, and needle counts are correct.  [name]. Allison Dover, MD, [facility] Surgery  General/ Trauma Surgery  03/17/2017 8:33 AM

## 2017-03-17 NOTE — Transfer of Care (Signed)
Immediate Anesthesia Transfer of Care Note  Patient: [name]  Procedure(s) Performed: Procedure(s): COLON RESECTION SIGMOID AND CREATION OF COLOSTOMY (N/A)  Patient Location: PACU  Anesthesia Type:General  Level of Consciousness: drowsy  Airway & Oxygen Therapy: Patient Spontanous Breathing and Patient connected to nasal cannula oxygen  Post-op Assessment: Report given to RN, Post -op Vital signs reviewed and stable and Patient moving all extremities  Post vital signs: Reviewed and stable  Last Vitals:  Vitals:   03/17/17 0230 03/17/17 0415  BP: (!) 117/50 (!) 125/56  Pulse: (!) 108 (!) 107  Resp: 19 (!) 22  Temp:      Last Pain:  Vitals:   03/17/17 0215  TempSrc:   PainSc: 4          Complications: No apparent anesthesia complications

## 2017-03-17 NOTE — H&P (Signed)
[name] is an 56 y.o. female.   Chief Complaint: LLQ abdominal pain HPI: This is a 56 yo female presents four days after left knee replacement by Dr. Noemi Chapel.  The patient was discharged 03/14/17.  She did not have her first post-op bowel movement until 5/18.  She began having LLQ abdominal pain on 5/17, but the pain has worsened.  Her abdomen is distended and she feels nauseated.  Last colonoscopy in 2016 by Dr. Arnoldo Morale showed diverticulosis.  Plain films showed free air.  CT scan shows perforated diverticulitis.  She was a Code Sepsis because of vitals signs and WBC.  Past Medical History:  Diagnosis Date  . Anxiety   . Cancer ([place])    hodgkins lymphoma, 1992 Diagnosed  . Hypertension   . Hypothyroidism    "thyroid is dead"  . Primary localized osteoarthritis of left knee     Past Surgical History:  Procedure Laterality Date  . AXILLARY LYMPH NODE DISSECTION Right 1992  . BONE MARROW TRANSPLANT    . COLONOSCOPY N/A 09/07/2015   Procedure: COLONOSCOPY;  Surgeon: Aviva Signs, MD;  Location[facility];  Service: Gastroenterology;  Laterality: N/A;  . ECTOPIC PREGNANCY SURGERY  1990  . TONSILLECTOMY  1965  . TOTAL KNEE ARTHROPLASTY Left 03/12/2017   Procedure: LEFT TOTAL KNEE ARTHROPLASTY;  Surgeon: Elsie Saas, MD;  Location: [place];  Service: Orthopedics;  Laterality: Left;    Family History  Problem Relation Age of Onset  . Breast cancer Mother   . Diabetes Father    Social History:  reports that she quit smoking about 26 years ago. She has never used smokeless tobacco. She reports that she does not drink alcohol or use drugs.  Allergies:  Allergies  Allergen Reactions  . Iodine-131 Nausea And Vomiting  . Cephalexin Rash  . Codeine Itching and Nausea Only  . Contrast Media [Iodinated Diagnostic Agents] Nausea And Vomiting   Prior to Admission medications   Medication Sig Start Date End Date Taking? Authorizing Provider  acetaminophen (TYLENOL) 325 MG tablet Take  2 tablets (650 mg total) by mouth every 6 (six) hours as needed for mild pain (or Fever >/= 101). 03/14/17  Yes Shepperson, Kirstin, PA-C  amLODipine (NORVASC) 10 MG tablet Take 10 mg by mouth every evening.  11/26/16  Yes [provider]  aspirin EC 325 MG EC tablet 1 tab a day for the next 30 days to prevent blood clots Patient taking differently: Take 325 mg by mouth daily. for the next 30 days to prevent blood clots 03/14/17  Yes Shepperson, Kirstin, PA-C  benazepril (LOTENSIN) 20 MG tablet Take 20 mg by mouth daily.   Yes [provider]  diphenhydrAMINE (BENADRYL) 25 mg capsule Take 25 mg by mouth at bedtime.   Yes [provider]  docusate sodium (COLACE) 100 MG capsule 1 tab 2 times a day while on narcotics.  STOOL SOFTENER Patient taking differently: Take 100 mg by mouth 2 (two) times daily. while on narcotics. 03/14/17  Yes Shepperson, Kirstin, PA-C  HYDROmorphone (DILAUDID) 2 MG tablet 1-2 tablets every 4 hrs as needed for pain Patient taking differently: Take 1-2 mg by mouth every 4 (four) hours as needed for severe pain.  03/14/17  Yes Shepperson, Kirstin, PA-C  levothyroxine (SYNTHROID, LEVOTHROID) 88 MCG tablet Take 88 mcg by mouth daily before breakfast.   Yes [provider]  loratadine (CLARITIN) 10 MG tablet Take 10 mg by mouth daily.   Yes [provider]  polyethylene glycol (MIRALAX / GLYCOLAX) packet 17grams in 16 oz of water twice a day until bowel movement.  LAXITIVE.  Restart if two days since last bowel movement 03/14/17  Yes Shepperson, Kirstin, PA-C  pravastatin (PRAVACHOL) 20 MG tablet Take 20 mg by mouth every evening.  12/01/16  Yes [provider]  Probiotic Product ([place]) Take 1 tablet by mouth daily.   Yes [provider]  TURMERIC PO Take 1 tablet by mouth daily.   Yes [provider]     Results for orders placed or performed during the hospital encounter of 03/16/17 (from the  past 48 hour(s))  Comprehensive metabolic panel     Status: Abnormal   Collection Time: 03/16/17 11:47 PM  Result Value Ref Range   Sodium 134 (L) 135 - 145 mmol/L   Potassium 3.9 3.5 - 5.1 mmol/L   Chloride 99 (L) 101 - 111 mmol/L   CO2 24 22 - 32 mmol/L   Glucose, Bld 166 (H) 65 - 99 mg/dL   BUN 12 6 - 20 mg/dL   Creatinine, Ser 0.76 0.44 - 1.00 mg/dL   Calcium 8.7 (L) 8.9 - 10.3 mg/dL   Total Protein 7.2 6.5 - 8.1 g/dL   Albumin 3.3 (L) 3.5 - 5.0 g/dL   AST 39 15 - 41 U/L   ALT 44 14 - 54 U/L   Alkaline Phosphatase 52 38 - 126 U/L   Total Bilirubin 0.9 0.3 - 1.2 mg/dL   GFR calc non Af Amer >60 >60 mL/min   GFR calc Af Amer >60 >60 mL/min    Comment: (NOTE) The eGFR has been calculated using the CKD EPI equation. This calculation has not been validated in all clinical situations. eGFR's persistently <60 mL/min signify possible Chronic Kidney Disease.    Anion gap 11 5 - 15  CBC with Differential     Status: Abnormal   Collection Time: 03/16/17 11:47 PM  Result Value Ref Range   WBC 22.0 (H) 4.0 - 10.5 K/uL   RBC 3.91 3.87 - 5.11 MIL/uL   Hemoglobin 12.3 12.0 - 15.0 g/dL   HCT 37.4 36.0 - 46.0 %   MCV 95.7 78.0 - 100.0 fL   MCH 31.5 26.0 - 34.0 pg   MCHC 32.9 30.0 - 36.0 g/dL   RDW 14.3 11.5 - 15.5 %   Platelets 223 150 - 400 K/uL   Neutrophils Relative % 90 %   Neutro Abs 19.8 (H) 1.7 - 7.7 K/uL   Lymphocytes Relative 8 %   Lymphs Abs 1.7 0.7 - 4.0 K/uL   Monocytes Relative 2 %   Monocytes Absolute 0.5 0.1 - 1.0 K/uL   Eosinophils Relative 0 %   Eosinophils Absolute 0.0 0.0 - 0.7 K/uL   Basophils Relative 0 %   Basophils Absolute 0.0 0.0 - 0.1 K/uL  Protime-INR     Status: None   Collection Time: 03/16/17 11:47 PM  Result Value Ref Range   Prothrombin Time 14.0 11.4 - 15.2 seconds   INR 1.08   Lipase, blood     Status: None   Collection Time: 03/16/17 11:47 PM  Result Value Ref Range   Lipase 16 11 - 51 U/L  I-Stat CG4 Lactic Acid, ED     Status: None    Collection Time: 03/17/17 12:39 AM  Result Value Ref Range   Lactic Acid, Venous 1.68 0.5 - 1.9 mmol/L  I-Stat Beta hCG blood, ED ([facility], [facility], [facility] only)  Status: Abnormal   Collection Time: 03/17/17  4:13 AM  Result Value Ref Range   I-stat hCG, quantitative 7.6 (H) <5 mIU/mL   Comment 3            Comment:   GEST. AGE      CONC.  (mIU/mL)   <=1 WEEK        5 - 50     2 WEEKS       50 - 500     3 WEEKS       100 - 10,000     4 WEEKS     1,000 - 30,000        FEMALE AND NON-PREGNANT FEMALE:     LESS THAN 5 mIU/mL   I-Stat CG4 Lactic Acid, ED     Status: None   Collection Time: 03/17/17  4:15 AM  Result Value Ref Range   Lactic Acid, Venous 0.83 0.5 - 1.9 mmol/L   Ct Abdomen Pelvis Wo Contrast  Result Date: 03/17/2017 CLINICAL DATA:  Left lower quadrant abdominal pain. Question free air on radiographs. EXAM: CT ABDOMEN AND PELVIS WITHOUT CONTRAST TECHNIQUE: Multidetector CT imaging of the abdomen and pelvis was performed following the standard protocol without IV contrast. COMPARISON:  Chest and abdominal radiographs earlier this day. Remote CT 02/20/2005 FINDINGS: Lower chest: Dependent atelectasis. Trace right pleural thickening. No frank pleural effusion. Hepatobiliary: Decreased hepatic density consistent with steatosis. Gallbladder physiologically distended, no calcified stone. No biliary dilatation. Pancreas: No ductal dilatation or inflammation. Spleen: Normal in size without focal abnormality. Adrenals/Urinary Tract: No adrenal nodule. Hydronephrosis versus parapelvic cyst in the right kidney, parapelvic cyst is favored. The ureter is decompressed. There is mild thinning of the right renal parenchyma. Nonobstructing 3 mm stone in the upper right kidney. Probable parapelvic cysts in the left kidney without frank hydronephrosis. Ureters decompressed. No left urolithiasis. There is a 4 mm stone either within or adjacent to the distal left ureter just proximal to the ureterovesicular junction.  Urinary bladder is physiologically distended. Stomach/Bowel: Free air throughout the abdomen and pelvis consistent with bowel perforation. Site of perforation localized to the sigmoid colon were there is acute diverticulitis. An adjacent elongated air fluid structure coursing in the left pericolic gutter may be a small abscess, measures approximately 3.5 x 3.0 cm. Multiple additional noninflamed diverticula throughout the descending and sigmoid colon. A few proximal scattered colonic diverticular also present. Normal appendix. Stomach is decompressed. No small bowel inflammation. Vascular/Lymphatic: Calcified retroperitoneal lymph nodes are unchanged from prior exam. No noncalcified adenopathy. Aortic atherosclerosis without aneurysm. Reproductive: Uterus and bilateral adnexa are unremarkable. Other: Moderate free air tracks throughout the abdomen and pelvis, free fluid in the left pericolic gutter. Musculoskeletal: Degenerative change in the spine. Subchondral sclerosis in the femoral heads consistent with avascular necrosis, no evidence of collapse. IMPRESSION: 1. Perforated sigmoid diverticulitis. Moderate volume of free air throughout the abdomen and pelvis. 2. Probable parapelvic cysts in the left kidney. A 4 mm stone in the left pelvis may be within the distal ureter, however would be nonobstructing, alternatively adjacent phlebolith is considered. A cystic structure in the right renal pelvis is likely a parapelvic cyst, hydronephrosis felt less likely. The ureter is decompressed. Nonobstructing right renal stone. Lack of IV contrast limits assessment of these findings. 3. Hepatic steatosis. 4. Aortic atherosclerosis. Critical Value/emergent results were called by telephone at the time of interpretation on 03/17/2017 at 3:54 am to [place] , who verbally acknowledged these results. Electronically Signed  By: Jeb Levering M.D.   On: 03/17/2017 03:55   Dg Chest Portable 1 View  Result Date:  03/17/2017 CLINICAL DATA:  Left lower quadrant pain and distention. History of total knee arthroplasty on Monday. Fever to 102.5. EXAM: PORTABLE CHEST 1 VIEW COMPARISON:  09/03/2011 FINDINGS: The heart size and mediastinal contours are within normal limits. Aortic atherosclerosis is noted at the arch. Atelectasis and/or infiltrate in the right lower lobe. There are crescentic lucencies beneath the diaphragm concerning for pneumoperitoneum. IMPRESSION: Subdiaphragmatic air like lucencies concerning for pneumoperitoneum. CT of the abdomen and pelvis is recommended. Critical Value/emergent results were called by telephone at the time of interpretation on 03/17/2017 at 2:04 am to [facility] , who verbally acknowledged these results. Right lower lobe atelectasis and/or pneumonia. Aortic atherosclerosis. Electronically Signed   By: Ashley Royalty M.D.   On: 03/17/2017 02:04   Dg Abd Portable 2 Views  Result Date: 03/17/2017 CLINICAL DATA:  Left lower quadrant abdominal pain and distention. Tachycardia fever. History of total knee replacement on Monday. EXAM: PORTABLE ABDOMEN - 2 VIEW COMPARISON:  None. FINDINGS: Subdiaphragmatic air like lucencies are noted suspicious for pneumoperitoneum. No bowel obstruction is seen. Degenerative changes are noted along the dorsal spine. Right basilar atelectasis and/or pneumonia is suggested. There is aortic atherosclerosis. IMPRESSION: Subdiaphragmatic air like lucencies suspicious for pneumoperitoneum. CT is recommended. Critical Value/emergent results were called by telephone at the time of interpretation on 03/17/2017 at 2:06 am to [facility], who verbally acknowledged these results. Right basilar atelectasis and/or pneumonia. Electronically Signed   By: Ashley Royalty M.D.   On: 03/17/2017 02:07    Review of Systems  Constitutional: Positive for chills and fever. Negative for weight loss.  HENT: Negative for ear discharge, ear pain, hearing loss and tinnitus.    Eyes: Negative for blurred vision, double vision, photophobia and pain.  Respiratory: Negative for cough, sputum production and shortness of breath.   Cardiovascular: Negative for chest pain.  Gastrointestinal: Positive for abdominal pain, constipation and nausea. Negative for vomiting.  Genitourinary: Negative for dysuria, flank pain, frequency and urgency.  Musculoskeletal: Positive for joint pain. Negative for back pain, falls, myalgias and neck pain.  Neurological: Negative for dizziness, tingling, sensory change, focal weakness, loss of consciousness and headaches.  Endo/Heme/Allergies: Does not bruise/bleed easily.  Psychiatric/Behavioral: Negative for depression, memory loss and substance abuse. The patient is not nervous/anxious.     Blood pressure (!) 117/50, pulse (!) 108, temperature (!) 102.5 F (39.2 C), temperature source Oral, resp. rate 19, height _0  (1.6 m), weight 104.8 kg (231 lb), SpO2 95 %. Physical Exam  WDWN in NAD Eyes:  Pupils equal, round; sclera anicteric HENT:  Oral mucosa moist; good dentition  Neck:  No masses palpated, no thyromegaly Lungs:  CTA bilaterally; normal respiratory effort CV:  Regular rate and rhythm; no murmurs; extremities well-perfused with no edema Abd:   Decreased bowel sounds, soft, significantly tender to palpation mostly in LLQ Skin:  Warm, dry; no sign of jaundice Psychiatric - alert and oriented x 4; calm mood and affect  Assessment/Plan Perforated sigmoid diverticulitis with free air and signs of sepsis  Will proceed to OR for exploratory laparotomy, sigmoid colectomy, colostomy.  Her case will be complicated by her size and the fact that she has been taking aspirin.  She may also be at increased risk of infection at her recent surgical site if she is bacteremic.  Benefits and risks discussed.    Maia Petties., MD 03/17/2017, 4:30  AM   

## 2017-03-17 NOTE — ED Notes (Signed)
Delayed abx due to difficulty obtaining IV.

## 2017-03-17 NOTE — Progress Notes (Signed)
Pharmacy Antibiotic Note  [name] is a 56 y.o. female admitted on 03/16/2017 with sepsis. S/p L TKA 5/14. Pharmacy has been consulted for Vancomycin and Zosyn dosing.  Vanc 2gm and Zosyn 3.375gm IV given in ED ~0130  Plan: Zosyn 3.375gm IV q8h - doses over 4 hours Vancomycin 750mg  IV q12h Will f/u micro data, renal function, and pt's clinical condition Vanc trough prn   Height: 5\' 3"  (160 cm) Weight: 231 lb (104.8 kg) IBW/kg (Calculated) : 52.4  Temp (24hrs), Avg:102.5 F (39.2 C), Min:102.5 F (39.2 C), Max:102.5 F (39.2 C)   Recent Labs Lab 03/13/17 0520 03/14/17 0729 03/16/17 2347 03/17/17 0039  WBC 14.6* 18.3* 22.0*  --   CREATININE 0.75 0.65 0.76  --   LATICACIDVEN  --   --   --  1.68    Estimated Creatinine Clearance: 88.8 mL/min (by C-G formula based on SCr of 0.76 mg/dL).    Allergies  Allergen Reactions  . Iodine-131 Nausea And Vomiting  . Cephalexin Rash  . Codeine Itching and Nausea Only  . Contrast Media [Iodinated Diagnostic Agents] Nausea And Vomiting    Antimicrobials this admission: 5/19 Vanc >>  5/19 Zosyn >>   Dose adjustments this admission: n/a  Microbiology results: 5/18 BCx x2:  5/19 UCx:    Thank you for allowing pharmacy to be a part of this patient's care.  Sherlon Handing, PharmD, BCPS Clinical pharmacist, pager [phone] 03/17/2017 3:42 AM

## 2017-03-17 NOTE — ED Provider Notes (Signed)
[facility] DEPT Provider Note   CSN: [id number] Arrival date & time: 03/16/17  2309     History   Chief Complaint Chief Complaint  Patient presents with  . Abdominal Pain    HPI SHERRYL VALIDO is a 56 y.o. female.  HPI CRYSTALLEE WERDEN is a 56 y.o. female with history of Hodgkin's lymphoma, hypertension, anxiety, presents to emergency room complaining of left lower abdominal pain. Patient states pain started yesterday morning. She reports left knee replacement 4 days ago. States knee is feeling well. She reports last bowel movement yesterday, states that was the first bowel movement since her surgery, reports it was small. She states her abdomen feels distended. She is passing gas. She reports nausea no vomiting. She is taking Tylenol for pain, states she is taking Dilaudid intermittently but tries to avoid it since it's making her have a headache. Denies any prior abdominal issues or surgeries. Patient does have history of ectopic pregnancy, denies being pregnant at this time. She was not aware that she had a fever, temperature 102.5. Denies any upper respiratory symptoms. Denies any vomiting. No blood in her stool. No diarrhea. Denies any cough or chest pain or SOB.   Past Medical History:  Diagnosis Date  . Anxiety   . Cancer ([place])    hodgkins lymphoma, 1992 Diagnosed  . Hypertension   . Hypothyroidism    "thyroid is dead"  . Primary localized osteoarthritis of left knee     Patient Active Problem List   Diagnosis Date Noted  . Primary localized osteoarthritis of left knee   . Hypothyroidism   . Hypertension   . Cancer ([place])   . OSTEOARTHRITIS, KNEE, RIGHT 03/30/2010    Past Surgical History:  Procedure Laterality Date  . AXILLARY LYMPH NODE DISSECTION Right 1992  . BONE MARROW TRANSPLANT    . COLONOSCOPY N/A 09/07/2015   Procedure: COLONOSCOPY;  Surgeon: Aviva Signs, MD;  Location: [facility];  Service: Gastroenterology;  Laterality: N/A;  . ECTOPIC PREGNANCY  SURGERY  1990  . TONSILLECTOMY  1965  . TOTAL KNEE ARTHROPLASTY Left 03/12/2017   Procedure: LEFT TOTAL KNEE ARTHROPLASTY;  Surgeon: Elsie Saas, MD;  Location: [facility];  Service: Orthopedics;  Laterality: Left;    OB History    No data available       Home Medications    Prior to Admission medications   Medication Sig Start Date End Date Taking? Authorizing Provider  acetaminophen (TYLENOL) 325 MG tablet Take 2 tablets (650 mg total) by mouth every 6 (six) hours as needed for mild pain (or Fever >/= 101). 03/14/17   Shepperson, Kirstin, PA-C  amLODipine (NORVASC) 10 MG tablet Take 10 mg by mouth every evening.  11/26/16   [provider]  aspirin EC 325 MG EC tablet 1 tab a day for the next 30 days to prevent blood clots 03/14/17   Shepperson, Kirstin, PA-C  benazepril (LOTENSIN) 20 MG tablet Take 20 mg by mouth daily.    [provider]  diphenhydrAMINE (BENADRYL) 25 mg capsule Take 25 mg by mouth at bedtime.    [provider]  docusate sodium (COLACE) 100 MG capsule 1 tab 2 times a day while on narcotics.  STOOL SOFTENER 03/14/17   Shepperson, Kirstin, PA-C  HYDROmorphone (DILAUDID) 2 MG tablet 1-2 tablets every 4 hrs as needed for pain 03/14/17   Shepperson, Kirstin, PA-C  levothyroxine (SYNTHROID, LEVOTHROID) 88 MCG tablet Take 88 mcg by mouth daily before breakfast.    [provider]  loratadine (CLARITIN) 10 MG tablet Take 10 mg by mouth daily.    [provider]  polyethylene glycol (MIRALAX / GLYCOLAX) packet 17grams in 16 oz of water twice a day until bowel movement.  LAXITIVE.  Restart if two days since last bowel movement 03/14/17   Shepperson, Kirstin, PA-C  pravastatin (PRAVACHOL) 20 MG tablet Take 20 mg by mouth every evening.  12/01/16   [provider]  Probiotic Product ([place]) Take 1 tablet by mouth daily.    [provider]  TURMERIC PO Take 1 tablet by mouth daily.    [provider]      Family History Family History  Problem Relation Age of Onset  . Breast cancer Mother   . Diabetes Father     Social History Social History  Substance Use Topics  . Smoking status: Former Smoker    Quit date: 05/08/1990  . Smokeless tobacco: Never Used  . Alcohol use No     Allergies   Iodine-131; Cephalexin; Codeine; and Contrast media [iodinated diagnostic agents]   Review of Systems Review of Systems  Constitutional: Positive for chills and fever.  Respiratory: Negative for cough, chest tightness and shortness of breath.   Cardiovascular: Negative for chest pain, palpitations and leg swelling.  Gastrointestinal: Positive for abdominal pain and nausea. Negative for diarrhea and vomiting.  Genitourinary: Negative for dysuria, flank pain and pelvic pain.  Musculoskeletal: Negative for arthralgias, myalgias, neck pain and neck stiffness.  Skin: Negative for rash.  Neurological: Negative for dizziness, weakness and headaches.  All other systems reviewed and are negative.    Physical Exam Updated Vital Signs BP 95/63 (BP Location: Left Arm)   Pulse (!) 134   Temp (!) 102.5 F (39.2 C) (Oral)   Resp 20   Ht 5\' 3"  (1.6 m)   Wt 231 lb (104.8 kg)   SpO2 93%   BMI 40.92 kg/m   Physical Exam  Constitutional: She appears well-developed and well-nourished. No distress.  HENT:  Head: Normocephalic.  Eyes: Conjunctivae are normal.  Neck: Neck supple.  Cardiovascular: Regular rhythm and normal heart sounds.   tachycardic  Pulmonary/Chest: Effort normal and breath sounds normal. No respiratory distress. She has no wheezes. She has no rales.  Abdominal: Soft. She exhibits no distension. There is tenderness. There is no rebound.  Decreased bowel sounds, left lower quadrant tenderness with guarding.  Musculoskeletal: She exhibits no edema.  Left knee swelling noted. Knee is warm to the touch. No erythema. Incision appears clean, dry, no drainage.   Neurological: She is  alert.  Skin: Skin is warm and dry.  Psychiatric: She has a normal mood and affect. Her behavior is normal.  Nursing note and vitals reviewed.    ED Treatments / Results  Labs (all labs ordered are listed, but only abnormal results are displayed) Labs Reviewed  COMPREHENSIVE METABOLIC PANEL - Abnormal; Notable for the following:       Result Value   Sodium 134 (*)    Chloride 99 (*)    Glucose, Bld 166 (*)    Calcium 8.7 (*)    Albumin 3.3 (*)    All other components within normal limits  CBC WITH DIFFERENTIAL/PLATELET - Abnormal; Notable for the following:    WBC 22.0 (*)    Neutro Abs 19.8 (*)    All other components within normal limits  URINALYSIS, ROUTINE W REFLEX MICROSCOPIC - Abnormal; Notable for the following:    Hgb urine  dipstick SMALL (*)    Squamous Epithelial / LPF 0-5 (*)    All other components within normal limits  I-STAT BETA HCG BLOOD, ED ([facility], [facility], [facility] ONLY) - Abnormal; Notable for the following:    I-stat hCG, quantitative 7.6 (*)    All other components within normal limits  CULTURE, BLOOD (ROUTINE X 2)  CULTURE, BLOOD (ROUTINE X 2)  URINE CULTURE  PROTIME-INR  LIPASE, BLOOD  I-STAT CG4 LACTIC ACID, ED  I-STAT CG4 LACTIC ACID, ED    EKG  EKG Interpretation None       Radiology Ct Abdomen Pelvis Wo Contrast  Result Date: 03/17/2017 CLINICAL DATA:  Left lower quadrant abdominal pain. Question free air on radiographs. EXAM: CT ABDOMEN AND PELVIS WITHOUT CONTRAST TECHNIQUE: Multidetector CT imaging of the abdomen and pelvis was performed following the standard protocol without IV contrast. COMPARISON:  Chest and abdominal radiographs earlier this day. Remote CT 02/20/2005 FINDINGS: Lower chest: Dependent atelectasis. Trace right pleural thickening. No frank pleural effusion. Hepatobiliary: Decreased hepatic density consistent with steatosis. Gallbladder physiologically distended, no calcified stone. No biliary dilatation. Pancreas: No ductal dilatation  or inflammation. Spleen: Normal in size without focal abnormality. Adrenals/Urinary Tract: No adrenal nodule. Hydronephrosis versus parapelvic cyst in the right kidney, parapelvic cyst is favored. The ureter is decompressed. There is mild thinning of the right renal parenchyma. Nonobstructing 3 mm stone in the upper right kidney. Probable parapelvic cysts in the left kidney without frank hydronephrosis. Ureters decompressed. No left urolithiasis. There is a 4 mm stone either within or adjacent to the distal left ureter just proximal to the ureterovesicular junction. Urinary bladder is physiologically distended. Stomach/Bowel: Free air throughout the abdomen and pelvis consistent with bowel perforation. Site of perforation localized to the sigmoid colon were there is acute diverticulitis. An adjacent elongated air fluid structure coursing in the left pericolic gutter may be a small abscess, measures approximately 3.5 x 3.0 cm. Multiple additional noninflamed diverticula throughout the descending and sigmoid colon. A few proximal scattered colonic diverticular also present. Normal appendix. Stomach is decompressed. No small bowel inflammation. Vascular/Lymphatic: Calcified retroperitoneal lymph nodes are unchanged from prior exam. No noncalcified adenopathy. Aortic atherosclerosis without aneurysm. Reproductive: Uterus and bilateral adnexa are unremarkable. Other: Moderate free air tracks throughout the abdomen and pelvis, free fluid in the left pericolic gutter. Musculoskeletal: Degenerative change in the spine. Subchondral sclerosis in the femoral heads consistent with avascular necrosis, no evidence of collapse. IMPRESSION: 1. Perforated sigmoid diverticulitis. Moderate volume of free air throughout the abdomen and pelvis. 2. Probable parapelvic cysts in the left kidney. A 4 mm stone in the left pelvis may be within the distal ureter, however would be nonobstructing, alternatively adjacent phlebolith is considered.  A cystic structure in the right renal pelvis is likely a parapelvic cyst, hydronephrosis felt less likely. The ureter is decompressed. Nonobstructing right renal stone. Lack of IV contrast limits assessment of these findings. 3. Hepatic steatosis. 4. Aortic atherosclerosis. Critical Value/emergent results were called by telephone at the time of interpretation on 03/17/2017 at 3:54 am to [facility] , who verbally acknowledged these results. Electronically Signed   By: Jeb Levering M.D.   On: 03/17/2017 03:55   Dg Chest Portable 1 View  Result Date: 03/17/2017 CLINICAL DATA:  Left lower quadrant pain and distention. History of total knee arthroplasty on Monday. Fever to 102.5. EXAM: PORTABLE CHEST 1 VIEW COMPARISON:  09/03/2011 FINDINGS: The heart size and mediastinal contours are within normal limits. Aortic atherosclerosis is noted  at the arch. Atelectasis and/or infiltrate in the right lower lobe. There are crescentic lucencies beneath the diaphragm concerning for pneumoperitoneum. IMPRESSION: Subdiaphragmatic air like lucencies concerning for pneumoperitoneum. CT of the abdomen and pelvis is recommended. Critical Value/emergent results were called by telephone at the time of interpretation on 03/17/2017 at 2:04 am to [facility] , who verbally acknowledged these results. Right lower lobe atelectasis and/or pneumonia. Aortic atherosclerosis. Electronically Signed   By: Ashley Royalty M.D.   On: 03/17/2017 02:04   Dg Abd Portable 2 Views  Result Date: 03/17/2017 CLINICAL DATA:  Left lower quadrant abdominal pain and distention. Tachycardia fever. History of total knee replacement on Monday. EXAM: PORTABLE ABDOMEN - 2 VIEW COMPARISON:  None. FINDINGS: Subdiaphragmatic air like lucencies are noted suspicious for pneumoperitoneum. No bowel obstruction is seen. Degenerative changes are noted along the dorsal spine. Right basilar atelectasis and/or pneumonia is suggested. There is aortic  atherosclerosis. IMPRESSION: Subdiaphragmatic air like lucencies suspicious for pneumoperitoneum. CT is recommended. Critical Value/emergent results were called by telephone at the time of interpretation on 03/17/2017 at 2:06 am to [facility], who verbally acknowledged these results. Right basilar atelectasis and/or pneumonia. Electronically Signed   By: Ashley Royalty M.D.   On: 03/17/2017 02:07    Procedures Procedures (including critical care time)  Medications Ordered in ED Medications  acetaminophen (TYLENOL) tablet 1,000 mg (not administered)  0.9 %  sodium chloride infusion (not administered)  sodium chloride 0.9 % bolus 1,000 mL (not administered)    And  sodium chloride 0.9 % bolus 1,000 mL (not administered)    And  sodium chloride 0.9 % bolus 1,000 mL (not administered)    And  sodium chloride 0.9 % bolus 500 mL (not administered)  piperacillin-tazobactam (ZOSYN) IVPB 3.375 g (not administered)  vancomycin (VANCOCIN) IVPB 1000 mg/200 mL premix (not administered)  fentaNYL (SUBLIMAZE) injection 50 mcg (not administered)  ondansetron (ZOFRAN-ODT) disintegrating tablet 8 mg (not administered)  Barium Sulfate 2.1 % SUSP (not administered)     Initial Impression / Assessment and Plan / ED Course  I have reviewed the triage vital signs and the nursing notes.  Pertinent labs & imaging results that were available during my care of the patient were reviewed by me and considered in my medical decision making (see chart for details).    patient in emergency department with fever, temperature 102.5, she is tachycardic, blood pressure is 95/63, complaining of left lower quadrant abdominal pain. She is 4 days postop from left total knee arthroplasty . Knee is warm and tender, suspect maybe post surgical findings, however cannot ro as source of fever at this time. Pt denies increased pain in the knee. Pt does have abdominal tenderness and guarding on exam, suspect that as the cause of her  infectious process. Pt meets sepsis criteria, fluids and antibiotics ordered.   Sepsis - Repeat Assessment  Performed at:    1:30 Am  Vitals     Blood pressure 135/51, pulse 115, temperature (!) 102.5 F (39.2 C), temperature source Oral, resp. rate 20, height 5\' 3"  (1.6 m), weight 231 lb (104.8 kg), SpO2 93 %.  Heart:     Regular rate and rhythm and Tachycardic  Lungs:    CTA  Capillary Refill:   <2 sec  Peripheral Pulse:   Radial pulse palpable  Skin:     Dry  2:59 AM Pt reassessed. Feeling better. CT pending. Spoke with CT, they are aware pt may have pneumoperitoneum, next on the list. Delay  in CT due to traumas in dept. VS improving. BP normal, HR still elevated. Will continue fluids.   4:20 AM CT showing perferated diverticuli, spoke with general surgery, will see pt and take to OR. Pt requesting more pain meds, fentanyl ordered.   Vitals:   03/17/17 0200 03/17/17 0215 03/17/17 0230 03/17/17 0415  BP: (!) 122/49 (!) 108/51 (!) 117/50 (!) 125/56  Pulse: (!) 115 (!) 110 (!) 108 (!) 107  Resp: (!) 21 20 19  (!) 22  Temp:      TempSrc:      SpO2: 96% 97% 95% 93%  Weight:      Height:          Final Clinical Impressions(s) / ED Diagnoses   Final diagnoses:  Perforated diverticulum  Sepsis, due to unspecified organism [facility])    New Prescriptions New Prescriptions   No medications on file     Jeannett Senior, PA-C 03/17/17 0174    Ezequiel Essex, MD 03/17/17 [phone]

## 2017-03-17 NOTE — Anesthesia Procedure Notes (Addendum)
Procedure Name: Intubation Date/Time: 03/17/2017 6:20 AM Performed by: Maude Leriche D Pre-anesthesia Checklist: Patient identified, Emergency Drugs available, Suction available and Patient being monitored Patient Re-evaluated:Patient Re-evaluated prior to inductionOxygen Delivery Method: Circle System Utilized Preoxygenation: Pre-oxygenation with 100% oxygen Intubation Type: IV induction Ventilation: Mask ventilation without difficulty Laryngoscope Size: Miller and 2 Grade View: Grade I Tube type: Oral Tube size: 7.5 mm Number of attempts: 1 Airway Equipment and Method: Stylet and Oral airway Placement Confirmation: ETT inserted through vocal cords under direct vision,  positive ETCO2 and breath sounds checked- equal and bilateral Secured at: 22 cm Tube secured with: Tape Dental Injury: Teeth and Oropharynx as per pre-operative assessment

## 2017-03-17 NOTE — ED Provider Notes (Signed)
HPI Comments: [name] is a 56 y.o. female with a PMHx of heart murmurs who presents to the Emergency Department complaining of acute, constant, left-sided abdominal pain since yesterday. She states her pain worsens when she moves. She has associated nausea and decreased appetite. Pt had a total left knee replacement surgery x5 days. She states when she got home from her surgery, her symptoms began. She had a regular BM yesterday but states she is not gassy. She denies diarrhea, vomiting, dysuria, hematuria, CP, back pain, and cough. Pt takes tylenol and dilaudid 1x/4hours for pain relief. She notes HA from taking dilaudid. Pt denies having similar pain previously.   Tachycardic, febrile Dry mucous membranes Obese abdomen is diffusely tender, worse in LLQ With guarding Left knee surgical incision appears clean with edema and warmth with no erythema  Abdominal films concerning for free air. Discussed with surgery. We'll proceed with CT scan per Dr. Kellie Moor recommendations. Patient treated as sepsis with IV fluids, IV antibiotics after blood cultures obtained.  CRITICAL CARE Performed by: Ezequiel Essex Total critical care time: 35 minutes Critical care time was exclusive of separately billable procedures and treating other patients. Critical care was necessary to treat or prevent imminent or life-threatening deterioration. Critical care was time spent personally by me on the following activities: development of treatment plan with patient and/or surrogate as well as nursing, discussions with consultants, evaluation of patient's response to treatment, examination of patient, obtaining history from patient or surrogate, ordering and performing treatments and interventions, ordering and review of laboratory studies, ordering and review of radiographic studies, pulse oximetry and re-evaluation of patient's condition.   I personally performed the services described in this documentation, which  was scribed in my presence. The recorded information has been reviewed and is accurate.    Ezequiel Essex, MD 03/17/17 [phone]

## 2017-03-17 NOTE — Anesthesia Preprocedure Evaluation (Addendum)
Anesthesia Evaluation  Patient identified by MRN, date of birth, ID band Patient awake    Reviewed: Allergy & Precautions, NPO status , Patient's Chart, lab work & pertinent test results  Airway Mallampati: II  TM Distance: >3 FB Neck ROM: Full    Dental no notable dental hx. (+) Poor Dentition, Edentulous Upper   Pulmonary former smoker,    Pulmonary exam normal        Cardiovascular hypertension, Pt. on medications Normal cardiovascular exam Rhythm:Regular Rate:Tachycardia     Neuro/Psych Anxiety negative neurological ROS     GI/Hepatic   Endo/Other  Hypothyroidism Morbid obesity  Renal/GU      Musculoskeletal  (+) Arthritis ,   Abdominal (+) + obese,   Peds  Hematology   Anesthesia Other Findings   Reproductive/Obstetrics                            Anesthesia Physical  Anesthesia Plan  ASA: III and emergent  Anesthesia Plan: General   Post-op Pain Management:    Induction: Intravenous, Rapid sequence and Cricoid pressure planned  Airway Management Planned: Oral ETT  Additional Equipment:   Intra-op Plan:   Post-operative Plan: Extubation in OR  Informed Consent: I have reviewed the patients History and Physical, chart, labs and discussed the procedure including the risks, benefits and alternatives for the proposed anesthesia with the patient or authorized representative who has indicated his/her understanding and acceptance.   Dental advisory given  Plan Discussed with: CRNA, Anesthesiologist and Surgeon  Anesthesia Plan Comments:        Anesthesia Quick Evaluation

## 2017-03-18 LAB — BASIC METABOLIC PANEL
ANION GAP: 8 (ref 5–15)
BUN: 8 mg/dL (ref 6–20)
CHLORIDE: 102 mmol/L (ref 101–111)
CO2: 27 mmol/L (ref 22–32)
Calcium: 7.6 mg/dL — ABNORMAL LOW (ref 8.9–10.3)
Creatinine, Ser: 0.73 mg/dL (ref 0.44–1.00)
GFR calc Af Amer: >60 mL/min (ref >60)
GFR calc non Af Amer: >60 mL/min (ref >60)
GLUCOSE: 106 mg/dL — AB (ref 65–99)
POTASSIUM: 4.3 mmol/L (ref 3.5–5.1)
Sodium: 137 mmol/L (ref 135–145)

## 2017-03-18 LAB — CBC
HEMATOCRIT: 30.8 % — AB (ref 36.0–46.0)
HEMOGLOBIN: 9.7 g/dL — AB (ref 12.0–15.0)
MCH: 31.4 pg (ref 26.0–34.0)
MCHC: 31.5 g/dL (ref 30.0–36.0)
MCV: 99.7 fL (ref 78.0–100.0)
Platelets: 180 10*3/uL (ref 150–400)
RBC: 3.09 MIL/uL — AB (ref 3.87–5.11)
RDW: 14.7 % (ref 11.5–15.5)
WBC: 16.9 10*3/uL — ABNORMAL HIGH (ref 4.0–10.5)

## 2017-03-18 LAB — URINE CULTURE

## 2017-03-18 MED ORDER — DOCUSATE SODIUM 100 MG PO CAPS
100.0000 mg | ORAL_CAPSULE | Freq: Two times a day (BID) | ORAL | Status: DC
  Administered 2017-03-18 – 2017-03-23 (×11): 100 mg via ORAL
  Filled 2017-03-18 (×11): qty 1

## 2017-03-18 NOTE — Progress Notes (Signed)
Rehab Admissions Coordinator Note:  Patient was screened by Retta Diones for appropriateness for an Inpatient Acute Rehab Consult.  At this time, we are recommending Inpatient Rehab consult.  Retta Diones 03/18/2017, 7:27 PM  I can be reached at [phone].

## 2017-03-18 NOTE — Evaluation (Signed)
Physical Therapy Evaluation Patient Details Name: [name] MRN: [id number] DOB: 03/02/1961 Today's Date: 03/18/2017   History of Present Illness  Admitted with peritonitis secondary to perforated sigmoid diverticulitis; now s/p exploratory laparotomy, sigmoid colectomy, creation of descending colostomy, VAC placement; Of note, she had a TKA 5/14;  has a past medical history of Anxiety; Cancer ([place]); Hypertension; Hypothyroidism; and Primary localized osteoarthritis of left knee.   Clinical Impression   Patient is s/p above surgery resulting in functional limitations due to the deficits listed below (see PT Problem List). Presents with abdominal pain limiting activity tolerance, and requires +2 assist to mobilize safely at this time; Recommend CIR for post-acute rehab to maximize independence and safety with mobility;  Patient will benefit from skilled PT to increase their independence and safety with mobility to allow discharge to the venue listed below.       Follow Up Recommendations CIR    Equipment Recommendations  Rolling walker with 5" wheels;3in1 (PT)    Recommendations for Other Services OT consult;Rehab consult     Precautions / Restrictions Precautions Precautions: Knee;Fall Precaution Booklet Issued: No Precaution Comments: Pt educated to not allow any pillow or bolster under knee for healing with optimal range of motion.  Restrictions LLE Weight Bearing: Weight bearing as tolerated      Mobility  Bed Mobility Overal bed mobility: Needs Assistance Bed Mobility: Rolling;Sidelying to Sit Rolling: Mod assist Sidelying to sit: Max assist       General bed mobility comments: Heavy mod assist and use of bed pad to fully roll into sidelying; Max assist to clear feet from EOB and elevate trunk to sitting  Transfers Overall transfer level: Needs assistance Equipment used: 2 person hand held assist Transfers: Sit to/from [facility] Sit to Stand: +2  physical assistance;Mod assist Stand pivot transfers: +2 physical assistance;Mod assist       General transfer comment: Mod assist to power up and support diring transition to chair  Ambulation/Gait                Stairs            Wheelchair Mobility    Modified Rankin (Stroke Patients Only)       Balance                                             Pertinent Vitals/Pain Pain Assessment: 0-10 Pain Score: 10-Worst pain ever Pain Location: Abdomen; knee pain present, too, but abdominal pain worse Pain Descriptors / Indicators: Aching;Grimacing;Guarding;Operative site guarding Pain Intervention(s): Monitored during session;PCA encouraged    Home Living Family/patient expects to be discharged to:: Private residence Living Arrangements: Spouse/significant other Available Help at Discharge: Family;Available 24 hours/day Type of Home: House Home Access: Stairs to enter Entrance Stairs-Rails: None Entrance Stairs-Number of Steps: 3 Home Layout: One level Home Equipment: Walker - 2 wheels;Bedside commode      Prior Function Level of Independence: Independent               Hand Dominance   Dominant Hand: Right    Extremity/Trunk Assessment   Upper Extremity Assessment Upper Extremity Assessment: Generalized weakness (and some associated abdominal discomfort)    Lower Extremity Assessment Lower Extremity Assessment: LLE deficits/detail LLE Deficits / Details: Grossly decr AROM and strength; range approx 5-65 deg LLE: Unable to fully assess due to pain  Communication   Communication: No difficulties  Cognition Arousal/Alertness: Awake/alert Behavior During Therapy: WFL for tasks assessed/performed Overall Cognitive Status: Within Functional Limits for tasks assessed                                        General Comments      Exercises Total Joint Exercises Ankle Circles/Pumps: AROM;Both;20  reps Quad Sets: AROM;Left;10 reps Short Arc Quad: AROM;Left;10 reps Heel Slides: AAROM;Left;10 reps Straight Leg Raises: AAROM;Left;10 reps (noted abdomonal discomfort) Long Arc Quad: AROM;Left;Seated;5 reps Knee Flexion: AROM;AAROM;Left;Seated;5 reps   Assessment/Plan    PT Assessment Patient needs continued PT services  PT Problem List Decreased strength;Decreased range of motion;Decreased activity tolerance;Decreased balance;Decreased mobility;Decreased knowledge of use of DME;Decreased safety awareness;Decreased knowledge of precautions;Pain;Obesity       PT Treatment Interventions DME instruction;Gait training;Stair training;Functional mobility training;Therapeutic activities;Therapeutic exercise;Balance training;Patient/family education    PT Goals (Current goals can be found in the Care Plan section)  Acute Rehab PT Goals Patient Stated Goal: be able to move well enough to get home PT Goal Formulation: With patient Time For Goal Achievement: 04/01/17 Potential to Achieve Goals: Good    Frequency 7X/week   Barriers to discharge        Co-evaluation               AM-PAC PT "6 Clicks" Daily Activity  Outcome Measure Difficulty turning over in bed (including adjusting bedclothes, sheets and blankets)?: Total Difficulty moving from lying on back to sitting on the side of the bed? : Total Difficulty sitting down on and standing up from a chair with arms (e.g., wheelchair, bedside commode, etc,.)?: Total Help needed moving to and from a bed to chair (including a wheelchair)?: A Lot Help needed walking in hospital room?: A Lot Help needed climbing 3-5 steps with a railing? : Total 6 Click Score: 8    End of Session   Activity Tolerance: Patient tolerated treatment well Patient left: in chair;with call bell/phone within reach Nurse Communication: Mobility status PT Visit Diagnosis: Other abnormalities of gait and mobility (R26.89);Pain Pain - Right/Left: Left  (abdomen ) Pain - part of body: Knee    Time: 8756-4332 PT Time Calculation (min) (ACUTE ONLY): 38 min   Charges:   PT Evaluation $PT Eval Moderate Complexity: 1 Procedure PT Treatments $Therapeutic Activity: 23-37 mins   PT G Codes:        Roney Marion, PT  Acute Rehabilitation Services Pager [phone] Office [phone]   Colletta [place] 03/18/2017, 5:45 PM

## 2017-03-18 NOTE — Consult Note (Signed)
[facility] Nurse wound consult note Reason for Consult:  Surgical midline abdominal wound with NPWT.  Dressing applied intraoperatively on Saturday, 03/17/17. It is intact and first surgical dressing change is to be performed on Monday (tomorrow). I have this evening ordered supplies (a medium NPWT dressing kit) to be delivered to the room. The cannister will not require changing in the morning. Left knee surgical wound: LEft knee arthroscopy performed by Dr. Ardeen Jourdain on 03/12/17. Wound edges closely approximated. Patient was being followed by [facility]. Wound type (s): Surgical.  [facility] Nurse ostomy consult note Stoma type/location: LLQ colostomy created by Dr. Georgette Dover on Saturday, 03/17/17.  The post operative pouching system is intact.  There is no flatus or effluent in the pouch. Of note is that the ostomy is in close proximity to the midline wound when visualized through pouch today.  [place] nursing team will follow this patient for ostomy education and support and will perform the initial NPWT dressing change tomorrow. Thanks, Maudie Flakes, MSN, RN, [place], Arther Abbott  Pager# [phone]  Time spent on this encounter (exclusive of documentation)  = 15 minutes

## 2017-03-18 NOTE — Progress Notes (Signed)
Orthopaedic Trauma Service (OTS)  1 Day Post-Op Procedure(s) (LRB): COLON RESECTION SIGMOID AND CREATION OF COLOSTOMY (N/A)  Subjective: Patient reports pain as mild.    Objective: Current Vitals Blood pressure (!) 136/56, pulse (!) 102, temperature 100 F (37.8 C), temperature source Oral, resp. rate 14, height 5\' 3"  (1.6 m), weight 104.8 kg (231 lb), SpO2 93 %. Vital signs in last 24 hours: Temp:  [98.2 F (36.8 C)-100 F (37.8 C)] 100 F (37.8 C) (05/20 1025) Pulse Rate:  [94-104] 102 (05/20 1025) Resp:  [12-18] 14 (05/20 1200) BP: (114-153)/(42-64) 136/56 (05/20 1025) SpO2:  [92 %-95 %] 93 % (05/20 1200)  Intake/Output from previous day: 05/19 0701 - 05/20 0700 In: 4134.3 [P.O.:600; I.V.:2884.3; IV Piggyback:650] Out: 2000 [Urine:1900; Drains:50; Blood:50]  LABS  Recent Labs  03/16/17 2347 03/17/17 1117 03/18/17 0427  HGB 12.3 10.3* 9.7*    Recent Labs  03/17/17 1117 03/18/17 0427  WBC 17.9* 16.9*  RBC 3.32* 3.09*  HCT 32.3* 30.8*  PLT 180 180    Recent Labs  03/17/17 1117 03/18/17 0427  NA 136 137  K 3.9 4.3  CL 107 102  CO2 23 27  BUN 7 8  CREATININE 0.67 0.73  GLUCOSE 136* 106*  CALCIUM 7.3* 7.6*    Recent Labs  03/16/17 2347  INR 1.08     Physical Exam RLE  Dressing intact, clean, dry; removed and left open to air; pristine  Edema/ swelling controlled  Sens: DPN, SPN, TN intact  Motor: EHL, FHL, and lessor toe ext and flex all intact  Brisk cap refill, warm to touch  Assessment/Plan: 1 Day Post-Op Procedure(s) (LRB): COLON RESECTION SIGMOID AND CREATION OF COLOSTOMY (N/A) 1. PT/OT WBAT left lower 2. DVT proph Lovenox 3. Luna Glasgow PA-C is aware and Dr. Noemi Chapel to be notified on his return to town  Altamese [place], [facility] [phone] (p)

## 2017-03-18 NOTE — Progress Notes (Addendum)
1 Day Post-Op   Subjective/Chief Complaint: No c/o. Pain ok. Did IS yesterday. No n/v.   Objective: Vital signs in last 24 hours: Temp:  [98.2 F (36.8 C)-99.1 F (37.3 C)] 98.6 F (37 C) (05/20 0450) Pulse Rate:  [94-107] 94 (05/20 0450) Resp:  [12-22] 16 (05/20 0452) BP: (108-153)/(42-65) 114/42 (05/20 0450) SpO2:  [92 %-96 %] 94 % (05/20 0452) Last BM Date: 03/14/17  Intake/Output from previous day: 05/19 0701 - 05/20 0700 In: 4134.3 [P.O.:600; I.V.:2884.3; IV Piggyback:650] Out: 2000 [Urine:1900; Drains:50; Blood:50] Intake/Output this shift: No intake/output data recorded.  Resting comfortably cta b/l; IS 1250 Reg Soft, obese, wound vac intact, mild approp TTP, ostomy viable, no air in bag +SCDs, no edema  Lab Results:   Recent Labs  03/17/17 1117 03/18/17 0427  WBC 17.9* 16.9*  HGB 10.3* 9.7*  HCT 32.3* 30.8*  PLT 180 180   BMET  Recent Labs  03/17/17 1117 03/18/17 0427  NA 136 137  K 3.9 4.3  CL 107 102  CO2 23 27  GLUCOSE 136* 106*  BUN 7 8  CREATININE 0.67 0.73  CALCIUM 7.3* 7.6*   PT/INR  Recent Labs  03/16/17 2347  LABPROT 14.0  INR 1.08   ABG No results for input(s): PHART, HCO3 in the last 72 hours.  Invalid input(s): PCO2, PO2  Studies/Results: Ct Abdomen Pelvis Wo Contrast  Result Date: 03/17/2017 CLINICAL DATA:  Left lower quadrant abdominal pain. Question free air on radiographs. EXAM: CT ABDOMEN AND PELVIS WITHOUT CONTRAST TECHNIQUE: Multidetector CT imaging of the abdomen and pelvis was performed following the standard protocol without IV contrast. COMPARISON:  Chest and abdominal radiographs earlier this day. Remote CT 02/20/2005 FINDINGS: Lower chest: Dependent atelectasis. Trace right pleural thickening. No frank pleural effusion. Hepatobiliary: Decreased hepatic density consistent with steatosis. Gallbladder physiologically distended, no calcified stone. No biliary dilatation. Pancreas: No ductal dilatation or  inflammation. Spleen: Normal in size without focal abnormality. Adrenals/Urinary Tract: No adrenal nodule. Hydronephrosis versus parapelvic cyst in the right kidney, parapelvic cyst is favored. The ureter is decompressed. There is mild thinning of the right renal parenchyma. Nonobstructing 3 mm stone in the upper right kidney. Probable parapelvic cysts in the left kidney without frank hydronephrosis. Ureters decompressed. No left urolithiasis. There is a 4 mm stone either within or adjacent to the distal left ureter just proximal to the ureterovesicular junction. Urinary bladder is physiologically distended. Stomach/Bowel: Free air throughout the abdomen and pelvis consistent with bowel perforation. Site of perforation localized to the sigmoid colon were there is acute diverticulitis. An adjacent elongated air fluid structure coursing in the left pericolic gutter may be a small abscess, measures approximately 3.5 x 3.0 cm. Multiple additional noninflamed diverticula throughout the descending and sigmoid colon. A few proximal scattered colonic diverticular also present. Normal appendix. Stomach is decompressed. No small bowel inflammation. Vascular/Lymphatic: Calcified retroperitoneal lymph nodes are unchanged from prior exam. No noncalcified adenopathy. Aortic atherosclerosis without aneurysm. Reproductive: Uterus and bilateral adnexa are unremarkable. Other: Moderate free air tracks throughout the abdomen and pelvis, free fluid in the left pericolic gutter. Musculoskeletal: Degenerative change in the spine. Subchondral sclerosis in the femoral heads consistent with avascular necrosis, no evidence of collapse. IMPRESSION: 1. Perforated sigmoid diverticulitis. Moderate volume of free air throughout the abdomen and pelvis. 2. Probable parapelvic cysts in the left kidney. A 4 mm stone in the left pelvis may be within the distal ureter, however would be nonobstructing, alternatively adjacent phlebolith is considered. A  cystic structure in  the right renal pelvis is likely a parapelvic cyst, hydronephrosis felt less likely. The ureter is decompressed. Nonobstructing right renal stone. Lack of IV contrast limits assessment of these findings. 3. Hepatic steatosis. 4. Aortic atherosclerosis. Critical Value/emergent results were called by telephone at the time of interpretation on 03/17/2017 at 3:54 am to [place] , who verbally acknowledged these results. Electronically Signed   By: Jeb Levering M.D.   On: 03/17/2017 03:55   Dg Chest Portable 1 View  Result Date: 03/17/2017 CLINICAL DATA:  Left lower quadrant pain and distention. History of total knee arthroplasty on Monday. Fever to 102.5. EXAM: PORTABLE CHEST 1 VIEW COMPARISON:  09/03/2011 FINDINGS: The heart size and mediastinal contours are within normal limits. Aortic atherosclerosis is noted at the arch. Atelectasis and/or infiltrate in the right lower lobe. There are crescentic lucencies beneath the diaphragm concerning for pneumoperitoneum. IMPRESSION: Subdiaphragmatic air like lucencies concerning for pneumoperitoneum. CT of the abdomen and pelvis is recommended. Critical Value/emergent results were called by telephone at the time of interpretation on 03/17/2017 at 2:04 am to [facility] , who verbally acknowledged these results. Right lower lobe atelectasis and/or pneumonia. Aortic atherosclerosis. Electronically Signed   By: Ashley Royalty M.D.   On: 03/17/2017 02:04   Dg Abd Portable 2 Views  Result Date: 03/17/2017 CLINICAL DATA:  Left lower quadrant abdominal pain and distention. Tachycardia fever. History of total knee replacement on Monday. EXAM: PORTABLE ABDOMEN - 2 VIEW COMPARISON:  None. FINDINGS: Subdiaphragmatic air like lucencies are noted suspicious for pneumoperitoneum. No bowel obstruction is seen. Degenerative changes are noted along the dorsal spine. Right basilar atelectasis and/or pneumonia is suggested. There is aortic  atherosclerosis. IMPRESSION: Subdiaphragmatic air like lucencies suspicious for pneumoperitoneum. CT is recommended. Critical Value/emergent results were called by telephone at the time of interpretation on 03/17/2017 at 2:06 am to [facility], who verbally acknowledged these results. Right basilar atelectasis and/or pneumonia. Electronically Signed   By: Ashley Royalty M.D.   On: 03/17/2017 02:07    Anti-infectives: Anti-infectives    Start     Dose/Rate Route Frequency Ordered Stop   03/17/17 1200  vancomycin (VANCOCIN) IVPB 750 mg/150 ml premix     750 mg 150 mL/hr over 60 Minutes Intravenous Every 12 hours 03/17/17 0347     03/17/17 1000  piperacillin-tazobactam (ZOSYN) IVPB 3.375 g     3.375 g 12.5 mL/hr over 240 Minutes Intravenous Every 8 hours 03/17/17 0347     03/17/17 0100  vancomycin (VANCOCIN) 2,000 mg in sodium chloride 0.9 % 500 mL IVPB     2,000 mg 250 mL/hr over 120 Minutes Intravenous  Once 03/17/17 0052 03/17/17 0423   03/17/17 0015  piperacillin-tazobactam (ZOSYN) IVPB 3.375 g     3.375 g 100 mL/hr over 30 Minutes Intravenous  Once 03/17/17 0004 03/17/17 0242   03/17/17 0015  vancomycin (VANCOCIN) IVPB 1000 mg/200 mL premix  Status:  Discontinued     1,000 mg 200 mL/hr over 60 Minutes Intravenous  Once 03/17/17 0004 03/17/17 0220      Assessment/Plan: -Perforated sigmoid diverticulitis s/p exp lap, sigmoid colectomy, Hartmann's pouch, end colostomy 5/19 Dr Georgette Dover -s/p L Knee arthroplasty 5/14 Dr Noemi Chapel  Doing well Clears as tolerated Cont foley today Cont IV abx Cont chemical vte prophylaxis Consult ortho for ongoing postop knee management/guidance - CPM, PT, etc pulm toilet Wound care consult for wound vac HTN - bp ok  Leighton Ruff. Redmond Pulling, MD, FACS General, Bariatric, & Minimally Invasive Surgery [facility] [place]   LOS: 1 day    [name] M 03/18/2017

## 2017-03-18 NOTE — Anesthesia Postprocedure Evaluation (Addendum)
Anesthesia Post Note  Patient: [name]  Procedure(s) Performed: Procedure(s) (LRB): COLON RESECTION SIGMOID AND CREATION OF COLOSTOMY (N/A)  Patient location during evaluation: PACU Anesthesia Type: General Level of consciousness: sedated and patient cooperative Pain management: pain level controlled Vital Signs Assessment: post-procedure vital signs reviewed and stable Respiratory status: spontaneous breathing Cardiovascular status: stable Anesthetic complications: no       Last Vitals:  Vitals:   03/18/17 0450 03/18/17 0452  BP: (!) 114/42   Pulse: 94   Resp: 16 16  Temp: 37 C     Last Pain:  Vitals:   03/18/17 0452  TempSrc:   PainSc: [place]

## 2017-03-19 ENCOUNTER — Encounter (HOSPITAL_COMMUNITY): Payer: Self-pay | Admitting: Surgery

## 2017-03-19 DIAGNOSIS — R739 Hyperglycemia, unspecified: Secondary | ICD-10-CM

## 2017-03-19 DIAGNOSIS — Z96652 Presence of left artificial knee joint: Secondary | ICD-10-CM

## 2017-03-19 DIAGNOSIS — K5720 Diverticulitis of large intestine with perforation and abscess without bleeding: Secondary | ICD-10-CM

## 2017-03-19 DIAGNOSIS — I10 Essential (primary) hypertension: Secondary | ICD-10-CM

## 2017-03-19 DIAGNOSIS — Z8579 Personal history of other malignant neoplasms of lymphoid, hematopoietic and related tissues: Secondary | ICD-10-CM

## 2017-03-19 DIAGNOSIS — K5780 Diverticulitis of intestine, part unspecified, with perforation and abscess without bleeding: Secondary | ICD-10-CM

## 2017-03-19 DIAGNOSIS — G8918 Other acute postprocedural pain: Secondary | ICD-10-CM

## 2017-03-19 HISTORY — DX: Presence of left artificial knee joint: Z96.652

## 2017-03-19 LAB — CBC
HCT: 29.2 % — ABNORMAL LOW (ref 36.0–46.0)
HEMOGLOBIN: 9.3 g/dL — AB (ref 12.0–15.0)
MCH: 31.2 pg (ref 26.0–34.0)
MCHC: 31.8 g/dL (ref 30.0–36.0)
MCV: 98 fL (ref 78.0–100.0)
Platelets: 215 10*3/uL (ref 150–400)
RBC: 2.98 MIL/uL — AB (ref 3.87–5.11)
RDW: 14.3 % (ref 11.5–15.5)
WBC: 15.7 10*3/uL — AB (ref 4.0–10.5)

## 2017-03-19 LAB — BASIC METABOLIC PANEL
ANION GAP: 7 (ref 5–15)
BUN: 8 mg/dL (ref 6–20)
CALCIUM: 7.6 mg/dL — AB (ref 8.9–10.3)
CO2: 25 mmol/L (ref 22–32)
Chloride: 102 mmol/L (ref 101–111)
Creatinine, Ser: 0.48 mg/dL (ref 0.44–1.00)
GFR calc non Af Amer: >60 mL/min (ref >60)
Glucose, Bld: 114 mg/dL — ABNORMAL HIGH (ref 65–99)
POTASSIUM: 4 mmol/L (ref 3.5–5.1)
Sodium: 134 mmol/L — ABNORMAL LOW (ref 135–145)

## 2017-03-19 LAB — VANCOMYCIN, TROUGH: VANCOMYCIN TR: 10 ug/mL — AB (ref 15–20)

## 2017-03-19 MED ORDER — VANCOMYCIN HCL 10 G IV SOLR
1250.0000 mg | Freq: Two times a day (BID) | INTRAVENOUS | Status: DC
  Administered 2017-03-19 – 2017-03-22 (×6): 1250 mg via INTRAVENOUS
  Filled 2017-03-19 (×6): qty 1250

## 2017-03-19 MED ORDER — ALUM & MAG HYDROXIDE-SIMETH 200-200-20 MG/5ML PO SUSP
30.0000 mL | Freq: Four times a day (QID) | ORAL | Status: DC | PRN
Start: 2017-03-19 — End: 2017-03-23
  Filled 2017-03-19: qty 30

## 2017-03-19 NOTE — Care Management Note (Signed)
Case Management Note  Patient Details  Name: JAQUAYA COYLE MRN: [id number] Date of Birth: [birth year]  Subjective/Objective:                    Action/Plan:  Patient discharge to home on 03-14-17 post op left total knee arthroplasty with [facility].  PT recommending CIR , IP MD consult ordered  MD and insurance will have to  authorize CIR .  SW consult ordered for SNF back up .   Case manager will follow in case patient discharges home. Will fax [facility] application once signed by PA or MD . Same in shadow chart. Expected Discharge Date:                  Expected Discharge Plan:     In-House Referral:  Clinical Social Work  Discharge planning Services  CM Consult  Post Acute Care Choice:  Home Health Choice offered to:     DME Arranged:    DME Agency:     HH Arranged:    [place] Agency:  [facility] (now [facility])  Status of Service:  In process, will continue to follow  If discussed at Long Length of Stay Meetings, dates discussed:    Additional Comments:  Marilu Favre, RN 03/19/2017, 9:44 AM

## 2017-03-19 NOTE — Evaluation (Signed)
Occupational Therapy Evaluation Patient Details Name: [name] MRN: [id number] DOB: Aug 03, 1961 Today's Date: 03/19/2017    History of Present Illness Admitted with peritonitis secondary to perforated sigmoid diverticulitis; now 5/19 s/p exploratory laparotomy, sigmoid colectomy, creation of descending colostomy, VAC placement; Of note, she had a TKA 5/14;  has a past medical history of Anxiety; Cancer ([place]); Hypertension; Hypothyroidism; and Primary localized osteoarthritis of left knee.    Clinical Impression   Patient is s/p colostomy surgery resulting in functional limitations due to the deficits listed below (see OT problem list). PTA was at home with home health s/p L TKA. Pt currently requires total +2 Mod (A) for transfers and total (A) for LB adls. Pt reports greatest barrier is L flank pain . Patient will benefit from skilled OT acutely to increase independence and safety with ADLS to allow discharge CIR.     Follow Up Recommendations  CIR    Equipment Recommendations  3 in 1 bedside commode    Recommendations for Other Services       Precautions / Restrictions Precautions Precautions: Knee;Fall Precaution Booklet Issued: No Required Braces or Orthoses: Knee Immobilizer - Left Knee Immobilizer - Left: On when out of bed or walking Restrictions Weight Bearing Restrictions: No LLE Weight Bearing: Weight bearing as tolerated      Mobility Bed Mobility Overal bed mobility: Needs Assistance Bed Mobility: Rolling;Supine to Sit Rolling: Max assist Sidelying to sit: +2 for physical assistance;Max assist       General bed mobility comments: in chair on arrival  Transfers Overall transfer level: Needs assistance Equipment used: Rolling walker (2 wheeled) Transfers: Sit to/from Stand Sit to Stand: +2 physical assistance;Mod assist Stand pivot transfers: +2 physical assistance;Min assist       General transfer comment: Mod assist to power up into standing and to  control descent to chair    Balance Overall balance assessment: Needs assistance Sitting-balance support: No upper extremity supported;Feet supported Sitting balance-Leahy Scale: Good     Standing balance support: During functional activity;No upper extremity supported Standing balance-Leahy Scale: Poor (reliant on RW for support) Standing balance comment: able to stand without support and don bathrobe without support                           ADL either performed or assessed with clinical judgement   ADL Overall ADL's : Needs assistance/impaired Eating/Feeding: Set up;Sitting   Grooming: Set up;Sitting   Upper Body Bathing: Set up;Sitting   Lower Body Bathing: Total assistance           Toilet Transfer: +2 for physical assistance;Moderate assistance Toilet Transfer Details (indicate cue type and reason): needs (A) to power up and max cues for hand placement   Toileting - Clothing Manipulation Details (indicate cue type and reason): unable to void bladder but reports the urge       General ADL Comments: pt with (A) for bed mobility but does transfer stand pivot to 3n1 <>chair this session     Vision   Vision Assessment?: No apparent visual deficits     Perception     Praxis      Pertinent Vitals/Pain Pain Assessment: No/denies pain Pain Score: 7  Pain Location: Abdomen pain Pain Descriptors / Indicators: Operative site guarding;Discomfort;Constant Pain Intervention(s): Monitored during session;Premedicated before session;Repositioned;Limited activity within patient's tolerance     Hand Dominance Right   Extremity/Trunk Assessment Upper Extremity Assessment Upper Extremity Assessment: Generalized weakness  Lower Extremity Assessment Lower Extremity Assessment: Defer to PT evaluation   Cervical / Trunk Assessment Cervical / Trunk Assessment: Normal   Communication Communication Communication: No difficulties   Cognition Arousal/Alertness:  Awake/alert Behavior During Therapy: WFL for tasks assessed/performed Overall Cognitive Status: Within Functional Limits for tasks assessed                                     General Comments       Exercises Exercises: Total Joint Total Joint Exercises Heel Slides: AAROM;Left;10 reps;Seated (towel under foot) Long Arc Quad: Left;10 reps;Seated;AAROM (Assist given for knee flexion) Knee Flexion: AAROM;Left;Seated;10 reps   Shoulder Instructions      Home Living Family/patient expects to be discharged to:: Private residence Living Arrangements: Spouse/significant other Available Help at Discharge: Family;Available 24 hours/day Type of Home: House Home Access: Stairs to enter [employer] of Steps: 3 Entrance Stairs-Rails: None Home Layout: One level     Bathroom Shower/Tub: Corporate investment banker: Standard Bathroom Accessibility: Yes   Home Equipment: Environmental consultant - 2 wheels;Bedside commode   Additional Comments: Homehealth prior to admission for L TKA      Prior Functioning/Environment Level of Independence: Independent                 OT Problem List:        OT Treatment/Interventions: Self-care/ADL training;Therapeutic exercise;DME and/or AE instruction;Therapeutic activities;Cognitive remediation/compensation;Patient/family education;Balance training    OT Goals(Current goals can be found in the care plan section) Acute Rehab OT Goals Patient Stated Goal: be able to move well enough to get home OT Goal Formulation: With patient/family Time For Goal Achievement: 04/02/17 Potential to Achieve Goals: Good  OT Frequency: Min 2X/week   Barriers to D/C:            Co-evaluation              AM-PAC PT "6 Clicks" Daily Activity     Outcome Measure Help from another person eating meals?: None Help from another person taking care of personal grooming?: A Little Help from another person toileting, which includes  using toliet, bedpan, or urinal?: A Lot Help from another person bathing (including washing, rinsing, drying)?: A Lot Help from another person to put on and taking off regular upper body clothing?: A Lot Help from another person to put on and taking off regular lower body clothing?: A Lot 6 Click Score: 15   End of Session Equipment Utilized During Treatment: Gait belt;Rolling walker Nurse Communication: Mobility status;Precautions  Activity Tolerance: Patient tolerated treatment well Patient left: in chair;with call bell/phone within reach;with chair alarm set;with family/visitor present  OT Visit Diagnosis: Unsteadiness on feet (R26.81)                Time: 7062-3762 OT Time Calculation (min): 20 min Charges:  OT General Charges $OT Visit: 1 Procedure OT Evaluation $OT Eval Moderate Complexity: 1 Procedure OT Treatments $Self Care/Home Management : 8-22 mins G-Codes:      Jeri Modena   OTR/L Pager: [phone] Office: [phone] .   Parke Poisson B 03/19/2017, 2:35 PM

## 2017-03-19 NOTE — Progress Notes (Addendum)
Pharmacy Antibiotic Note  [name] is a 56 y.o. female admitted on 03/16/2017 with sepsis.  Pharmacy has been consulted for Vancomycin and Zosyn dosing.  WBC is correcting and blood cultures are ntd. Vanc trough is low, goal 15-20.  Dose has been hung.  Plan: Change Vancomycin to 1250mg  IV q12, next dose at 8PM Continue ZOsyn 3.375g IV q8 F/U culture results, clinical status  Height: 5\' 3"  (160 cm) Weight: 231 lb (104.8 kg) IBW/kg (Calculated) : 52.4  Temp (24hrs), Avg:98.5 F (36.9 C), Min:97.6 F (36.4 C), Max:99.2 F (37.3 C)   Recent Labs Lab 03/14/17 0729 03/16/17 2347 03/17/17 0039 03/17/17 0415 03/17/17 1117 03/18/17 0427 03/19/17 0430  WBC 18.3* 22.0*  --   --  17.9* 16.9* 15.7*  CREATININE 0.65 0.76  --   --  0.67 0.73 0.48  LATICACIDVEN  --   --  1.68 0.83  --   --   --     Estimated Creatinine Clearance: 88.8 mL/min (by C-G formula based on SCr of 0.48 mg/dL).    Allergies  Allergen Reactions  . Iodine-131 Nausea And Vomiting  . Cephalexin Rash  . Codeine Itching and Nausea Only  . Contrast Media [Iodinated Diagnostic Agents] Nausea And Vomiting    Antimicrobials this admission: Vanc 5/19 >>  Zosyn 5/19 >>    Dose adjustments this admission: 5/21  VT:   Microbiology results: 5/18 BCx x2: ntd 5/19 UCx - negative  Thank you for allowing pharmacy to be a part of this patient's care.   Lewie Chamber., PharmD Clinical Pharmacist [facility]

## 2017-03-19 NOTE — Progress Notes (Signed)
Orthopedic Tech Progress Note Patient Details:  [name] 10-08-1961 [id number] Off cpm at 1730 Ortho Devices Type of Ortho Device:  (footsie roll) Ortho Device/Splint Interventions: Application   Braulio Bosch 03/19/2017, 5:30 PM

## 2017-03-19 NOTE — Progress Notes (Signed)
Orthopedic Tech Progress Note Patient Details:  [name] [birth year] [id number]  CPM Left Knee CPM Left Knee: On Left Knee Flexion (Degrees): 90 Left Knee Extension (Degrees): 0   Estellar Cadena 03/19/2017, 2:55 PM Placed pt's lle on cpm @0 -90 degrees @1445 ; RN notified

## 2017-03-19 NOTE — Progress Notes (Signed)
Subjective: 2 Days Post-Op Procedure(s) (LRB): COLON RESECTION SIGMOID AND CREATION OF COLOSTOMY (N/A)  7 days s/p left total knee Patient reports pain as 4 on 0-10 scale.    Objective: Vital signs in last 24 hours: Temp:  [97.6 F (36.4 C)-99.2 F (37.3 C)] 98.4 F (36.9 C) (05/21 1056) Pulse Rate:  [97-108] 97 (05/21 1056) Resp:  [9-24] 14 (05/21 1228) BP: (90-127)/(45-80) 127/80 (05/21 1056) SpO2:  [93 %-97 %] 96 % (05/21 1228)  Intake/Output from previous day: 05/20 0701 - 05/21 0700 In: 1720 [P.O.:480; I.V.:1040; IV Piggyback:200] Out: 1600 [Urine:1600] Intake/Output this shift: Total I/O In: 360 [P.O.:360] Out: -    Recent Labs  03/16/17 2347 03/17/17 1117 03/18/17 0427 03/19/17 0430  HGB 12.3 10.3* 9.7* 9.3*    Recent Labs  03/18/17 0427 03/19/17 0430  WBC 16.9* 15.7*  RBC 3.09* 2.98*  HCT 30.8* 29.2*  PLT 180 215    Recent Labs  03/18/17 0427 03/19/17 0430  NA 137 134*  K 4.3 4.0  CL 102 102  CO2 27 25  BUN 8 8  CREATININE 0.73 0.48  GLUCOSE 106* 114*  CALCIUM 7.6* 7.6*    Recent Labs  03/16/17 2347  INR 1.08    Neurovascular intact Sensation intact distally Intact pulses distally Dorsiflexion/Plantar flexion intact Incision: no drainage  Assessment/Plan: 2 Days Post-Op Procedure(s) (LRB): COLON RESECTION SIGMOID AND CREATION OF COLOSTOMY (N/A)  7 days s/p left total knee Principal Problem:   Perforation of sigmoid colon due to diverticulitis Active Problems:   Hypertension   Cancer ([facility])   S/P total knee replacement using cement, left 03/12/2017 by Dr Noemi Chapel   Up with therapy.  Agree with medicine and physical therapy that she would be an excellent candidate for inpatient rehab.  I have ordered her CPM to restart at 0-90 degrees 6 hrs a day broken up into 3 session of 2 hrs each.  I have also ordered a zero degree knee bone foam to use when not walking or in the CPM.  New dressing applied to left knee today after knee was  wiped with chlorohexidine, new steri strips were applied with Benzoin and a new Aquacel dressing was applied.  SCD were reapplied after new dressing was applied  [name] J 03/19/2017, 2:21 PM

## 2017-03-19 NOTE — Consult Note (Signed)
Physical Medicine and Rehabilitation Consult   Reason for Consult: L-TKR complicated by perforated colon Referring Physician: Dr. Rosendo Gros   HPI: [name] is a 56 y.o. female with history of lymphoma, L-TKR 5/14 who was discharged to home but readmitted on 5/19 with LLQ pain due to perforated diverticulitis. History taken from chart review and husband. She underwent sigmoid colectomy with diverting colostomy by Dr. Georgette Dover. Post op on IV antibiotics and started on full liquids today. She is currently limited by pain with inability with deficits in mobility. CIR recommended for follow up therapy. 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  Has a supportive husband who's been off to assist after knee surgery, but is planning on going back to work tomorrow.    Review of Systems  Constitutional: Negative for chills.  HENT: Negative for hearing loss and tinnitus.   Eyes: Negative for blurred vision and double vision.  Respiratory: Negative for cough, hemoptysis and wheezing.   Cardiovascular: Negative for chest pain and palpitations.  Gastrointestinal: Positive for abdominal pain. Negative for nausea.  Genitourinary: Negative for dysuria and urgency.  Musculoskeletal: Positive for joint pain. Negative for myalgias.  Skin: Negative for rash.  Neurological: Negative for dizziness, sensory change, weakness and headaches.  Psychiatric/Behavioral: Negative for depression. The patient does not have insomnia.   All other systems reviewed and are negative.     Past Medical History:  Diagnosis Date  . Anxiety   . Cancer ([place])    hodgkins lymphoma, 1992 Diagnosed  . Hypertension   . Hypothyroidism    "thyroid is dead"  . Primary localized osteoarthritis of left knee     Past Surgical History:  Procedure Laterality Date  . AXILLARY LYMPH NODE DISSECTION Right 1992  . BONE MARROW TRANSPLANT    . COLON RESECTION SIGMOID N/A 03/17/2017   Procedure:  COLON RESECTION SIGMOID AND CREATION OF COLOSTOMY;  Surgeon: Donnie Mesa, MD;  Location: [facility];  Service: General;  Laterality: N/A;  . COLONOSCOPY N/A 09/07/2015   Procedure: COLONOSCOPY;  Surgeon: Aviva Signs, MD;  [facility];  Service: Gastroenterology;  Laterality: N/A;  . ECTOPIC PREGNANCY SURGERY  1990  . TONSILLECTOMY  1965  . TOTAL KNEE ARTHROPLASTY Left 03/12/2017   Procedure: LEFT TOTAL KNEE ARTHROPLASTY;  Surgeon: Elsie Saas, MD;  Location: [facility];  Service: Orthopedics;  Laterality: Left;    Family History  Problem Relation Age of Onset  . Breast cancer Mother   . Diabetes Father     Social History:  Married. Independent without AD. She reports that she quit smoking about 26 years ago. She has never used smokeless tobacco. She reports that she does not drink alcohol or use drugs.    Allergies  Allergen Reactions  . Iodine-131 Nausea And Vomiting  . Cephalexin Rash  . Codeine Itching and Nausea Only  . Contrast Media [Iodinated Diagnostic Agents] Nausea And Vomiting    Medications Prior to Admission  Medication Sig Dispense Refill  . acetaminophen (TYLENOL) 325 MG tablet Take 2 tablets (650 mg total) by mouth every 6 (six) hours as needed for mild pain (or Fever >/= 101).    Marland Kitchen amLODipine (NORVASC) 10 MG tablet Take 10 mg by mouth every evening.     Marland Kitchen aspirin EC 325 MG EC tablet 1 tab a day for the next 30 days to prevent blood clots (Patient taking differently: Take 325 mg by mouth daily. for the next  30 days to prevent blood clots) 30 tablet 0  . benazepril (LOTENSIN) 20 MG tablet Take 20 mg by mouth daily.    . diphenhydrAMINE (BENADRYL) 25 mg capsule Take 25 mg by mouth at bedtime.    . docusate sodium (COLACE) 100 MG capsule 1 tab 2 times a day while on narcotics.  STOOL SOFTENER (Patient taking differently: Take 100 mg by mouth 2 (two) times daily. while on narcotics.) 60 capsule 0  . HYDROmorphone (DILAUDID) 2 MG tablet 1-2 tablets every 4 hrs as  needed for pain (Patient taking differently: Take 1-2 mg by mouth every 4 (four) hours as needed for severe pain. ) 84 tablet 0  . levothyroxine (SYNTHROID, LEVOTHROID) 88 MCG tablet Take 88 mcg by mouth daily before breakfast.    . loratadine (CLARITIN) 10 MG tablet Take 10 mg by mouth daily.    . polyethylene glycol (MIRALAX / GLYCOLAX) packet 17grams in 16 oz of water twice a day until bowel movement.  LAXITIVE.  Restart if two days since last bowel movement 14 each 0  . pravastatin (PRAVACHOL) 20 MG tablet Take 20 mg by mouth every evening.     . Probiotic Product (PHILLIPS COLON HEALTH PO) Take 1 tablet by mouth daily.    . TURMERIC PO Take 1 tablet by mouth daily.      Home: Home Living Family/patient expects to be discharged to:: Private residence Living Arrangements: Spouse/significant other Available Help at Discharge: Family, Available 24 hours/day Type of Home: House Home Access: Stairs to enter [employer] of Steps: 3 Entrance Stairs-Rails: None Home Layout: One level Bathroom Shower/Tub: Tub/shower unit, Architectural technologist: Standard Bathroom Accessibility: Yes Home Equipment: Environmental consultant - 2 wheels, Bedside commode  Functional History: Prior Function Level of Independence: Independent Functional Status:  Mobility: Bed Mobility Overal bed mobility: Needs Assistance Bed Mobility: Rolling, Sidelying to Sit Rolling: Mod assist Sidelying to sit: Max assist General bed mobility comments: Heavy mod assist and use of bed pad to fully roll into sidelying; Max assist to clear feet from EOB and elevate trunk to sitting Transfers Overall transfer level: Needs assistance Equipment used: 2 person hand held assist Transfers: Sit to/from Stand, Stand Pivot Transfers Sit to Stand: +2 physical assistance, Mod assist Stand pivot transfers: +2 physical assistance, Mod assist General transfer comment: Mod assist to power up and support diring transition to chair       ADL:    Cognition: Cognition Overall Cognitive Status: Within Functional Limits for tasks assessed Orientation Level: Oriented X4 Cognition Arousal/Alertness: Awake/alert Behavior During Therapy: WFL for tasks assessed/performed Overall Cognitive Status: Within Functional Limits for tasks assessed  Blood pressure 122/61, pulse (!) 103, temperature 98 F (36.7 C), temperature source Oral, resp. rate 19, height 5\' 3"  (1.6 m), weight 104.8 kg (231 lb), SpO2 95 %. Physical Exam  Nursing note and vitals reviewed. Constitutional: She is oriented to person, place, and time. She appears well-developed. She appears lethargic. She is easily aroused. Nasal cannula in place.  Morbidly obese.   HENT:  Head: Normocephalic and atraumatic.  Eyes: Conjunctivae and EOM are normal. Pupils are equal, round, and reactive to light.  Neck: Normal range of motion. Neck supple.  Cardiovascular: Normal rate and regular rhythm.   Respiratory: Effort normal and breath sounds normal. No stridor. No respiratory distress. She has no wheezes.  +[place]  GI: Soft. Bowel sounds are normal. She exhibits no distension. There is tenderness.  Abdominal VAC in place with patent colostomy.   Musculoskeletal: She  exhibits edema and tenderness.  Left knee incision with min edema, clean, dry and intact---healing without s/s of infection. Able to flex 30 degrees without discomfort.  Neurological: She is oriented to person, place, and time and easily aroused. She appears lethargic.  Had difficulty staying wake but interacts appropriately (recently received pain meds). Speech clear and able to follow basic commands without difficulty.  Motor; B/l UE 4+/5 proximal to distal LLE: HF 3/5, ADF/PF 4/5 RLE: 4/5 proximal to distal  Skin: Skin is warm and dry.  See above  Psychiatric: She has a normal mood and affect. Her behavior is normal. Thought content normal.    Results for orders placed or performed during the hospital  encounter of 03/16/17 (from the past 24 hour(s))  CBC     Status: Abnormal   Collection Time: 03/19/17  4:30 AM  Result Value Ref Range   WBC 15.7 (H) 4.0 - 10.5 K/uL   RBC 2.98 (L) 3.87 - 5.11 MIL/uL   Hemoglobin 9.3 (L) 12.0 - 15.0 g/dL   HCT 29.2 (L) 36.0 - 46.0 %   MCV 98.0 78.0 - 100.0 fL   MCH 31.2 26.0 - 34.0 pg   MCHC 31.8 30.0 - 36.0 g/dL   RDW 14.3 11.5 - 15.5 %   Platelets 215 150 - 400 K/uL  Basic metabolic panel     Status: Abnormal   Collection Time: 03/19/17  4:30 AM  Result Value Ref Range   Sodium 134 (L) 135 - 145 mmol/L   Potassium 4.0 3.5 - 5.1 mmol/L   Chloride 102 101 - 111 mmol/L   CO2 25 22 - 32 mmol/L   Glucose, Bld 114 (H) 65 - 99 mg/dL   BUN 8 6 - 20 mg/dL   Creatinine, Ser 0.48 0.44 - 1.00 mg/dL   Calcium 7.6 (L) 8.9 - 10.3 mg/dL   GFR calc non Af Amer >60 >60 mL/min   GFR calc Af Amer >60 >60 mL/min   Anion gap 7 5 - 15   No results found.  Assessment/Plan: Diagnosis: L-TKR complicated by perforated colon Labs and images independently reviewed.  Records reviewed and summated above.  1. Does the need for close, 24 hr/day medical supervision in concert with the patient's rehab needs make it unreasonable for this patient to be served in a less intensive setting? Yes 2. Co-Morbidities requiring supervision/potential complications: lymphoma (cont monitor), L-TKR 5/14, perforated diverticulitis (cont IV Vanc/Zosyn), full liquid diet (advance as tolerated), Hyperglycemia (Monitor in accordance with exercise and adjust meds as necessary), post-op pain (Biofeedback training with therapies to help reduce reliance on opiate pain medications, particularly dilaudid PCA, monitor pain control during therapies, and sedation at rest and titrate to maximum efficacy to ensure participation and gains in therapies) 3. Due to bladder management, bowel management, safety, skin/wound care, disease management, pain management and patient education, does the patient require 24  hr/day rehab nursing? Yes 4. Does the patient require coordinated care of a physician, rehab nurse, PT (1-2 hrs/day, 5 days/week) and OT (1-2 hrs/day, 5 days/week) to address physical and functional deficits in the context of the above medical diagnosis(es)? Yes Addressing deficits in the following areas: balance, endurance, locomotion, strength, transferring, bowel/bladder control, bathing, dressing, toileting and psychosocial support 5. Can the patient actively participate in an intensive therapy program of at least 3 hrs of therapy per day at least 5 days per week? Potentially 6. The potential for patient to make measurable gains while on inpatient rehab is excellent 7. Anticipated  functional outcomes upon discharge from inpatient rehab are modified independent and supervision  with PT, modified independent and supervision with OT, n/a with SLP. 8. Estimated rehab length of stay to reach the above functional goals is: 8-13 days. 9. Anticipated D/C setting: Home 10. Anticipated post D/C treatments: HH therapy and Home excercise program 11. Overall Rehab/Functional Prognosis: excellent  RECOMMENDATIONS: This patient's condition is appropriate for continued rehabilitative care in the following setting: Will follow as pain improves.  Anticipate, pt will have functional improvements accordingly. Patient has agreed to participate in recommended program. Potentially Note that insurance prior authorization may be required for reimbursement for recommended care.  Comment: Rehab Admissions Coordinator to follow up.  Delice Lesch, MD, [address], [place] 03/19/2017

## 2017-03-19 NOTE — Progress Notes (Signed)
2 Days Post-Op   Subjective/Chief Complaint: Doing well [place] CLD Passing flatus   Objective: Vital signs in last 24 hours: Temp:  [97.6 F (36.4 C)-100 F (37.8 C)] 98 F (36.7 C) (05/21 0413) Pulse Rate:  [101-108] 103 (05/21 0413) Resp:  [9-24] 19 (05/21 0757) BP: (90-136)/(45-66) 122/61 (05/21 0413) SpO2:  [93 %-97 %] 95 % (05/21 0757) Last BM Date: 03/14/17  Intake/Output from previous day: 05/20 0701 - 05/21 0700 In: 1720 [P.O.:480; I.V.:1040; IV Piggyback:200] Out: 1600 [Urine:1600] Intake/Output this shift: No intake/output data recorded.  General appearance: alert and cooperative GI: soft, ostomy pink/patent  Lab Results:   Recent Labs  03/18/17 0427 03/19/17 0430  WBC 16.9* 15.7*  HGB 9.7* 9.3*  HCT 30.8* 29.2*  PLT 180 215   BMET  Recent Labs  03/18/17 0427 03/19/17 0430  NA 137 134*  K 4.3 4.0  CL 102 102  CO2 27 25  GLUCOSE 106* 114*  BUN 8 8  CREATININE 0.73 0.48  CALCIUM 7.6* 7.6*   PT/INR  Recent Labs  03/16/17 2347  LABPROT 14.0  INR 1.08   ABG No results for input(s): PHART, HCO3 in the last 72 hours.  Invalid input(s): PCO2, PO2  Studies/Results: No results found.  Anti-infectives: Anti-infectives    Start     Dose/Rate Route Frequency Ordered Stop   03/17/17 1200  vancomycin (VANCOCIN) IVPB 750 mg/150 ml premix     750 mg 150 mL/hr over 60 Minutes Intravenous Every 12 hours 03/17/17 0347     03/17/17 1000  piperacillin-tazobactam (ZOSYN) IVPB 3.375 g     3.375 g 12.5 mL/hr over 240 Minutes Intravenous Every 8 hours 03/17/17 0347     03/17/17 0100  vancomycin (VANCOCIN) 2,000 mg in sodium chloride 0.9 % 500 mL IVPB     2,000 mg 250 mL/hr over 120 Minutes Intravenous  Once 03/17/17 0052 03/17/17 0423   03/17/17 0015  piperacillin-tazobactam (ZOSYN) IVPB 3.375 g     3.375 g 100 mL/hr over 30 Minutes Intravenous  Once 03/17/17 0004 03/17/17 0242   03/17/17 0015  vancomycin (VANCOCIN) IVPB 1000 mg/200 mL premix   Status:  Discontinued     1,000 mg 200 mL/hr over 60 Minutes Intravenous  Once 03/17/17 0004 03/17/17 0220      Assessment/Plan: s/p Procedure(s): COLON RESECTION SIGMOID AND CREATION OF COLOSTOMY (N/A) Advance diet to FLD Mobilize Await bowel function Con't abx Dr. Noemi Chapel aware of pt's rm number  LOS: 2 days    Rosario Jacks., [facility] 03/19/2017

## 2017-03-19 NOTE — Consult Note (Signed)
[place] Nurse wound consult note Reason for Consult: NPWT dressing change, first post op dressing Wound type: surgical  Pressure Injury POA: No Measurement:21cm x 4.5cm x 3.5cm  Wound NKN:LZJQB, subcutaneous tissue, moist Drainage (amount, consistency, odor) minimal in canister, some pooling of serosanginous drainage in the base of the distal edge of the wound bed. Periwound: intact Dressing procedure/placement/frequency: 1pc of black foam used to fill the wound bed, sealed at 166mmHG.  Patient tolerated well, used PCA pump 1x during dressing change.  [place] Nurse ostomy consult note Stoma type/location: LLQ, end colostomy Stomal assessment/size: 1" x 1 3/4" oval shaped, in an abdominal skin fold, slightly budded Peristomal assessment: intact, one area of MARSI (medical adhesive skin injury noted at 2 oclock at the distal edge of the tape border of the ostomy wafer. Blister intact  Treatment options for stomal/peristomal skin: liquid skin barrier used on blistered area Output none Ostomy pouching: 1pc convex flexible used today, 2" barrier ring cut in 1/2 and each half used around the stoma at 3 and 9 oclock to flatted dip in abdominal contour.  Education provided: Husband engaged to learn, provided Gaffer. He observed pouch change today.  Patient is quite sleepy from PCA pump use from dressing change and from dose she self administered during pouch change.   Enrolled patient in [place] program: No, SS permission card signed, will wait until I evaluate use of flex convex vs. Use of flat convex before I send samples.  [place] Nurse will follow along with you for continued support with ostomy teaching and care and NPWT dressing changes, due to the proximity of the dressing and the ostomy wafer.  Anabel Lykins [facility] MSN, [place], [place], [place]

## 2017-03-19 NOTE — Progress Notes (Signed)
Physical Therapy Treatment Patient Details Name: [name] MRN: [id number] DOB: Mar 25, 1961 Today's Date: 03/19/2017    History of Present Illness Admitted with peritonitis secondary to perforated sigmoid diverticulitis; now 5/19 s/p exploratory laparotomy, sigmoid colectomy, creation of descending colostomy, VAC placement; Of note, she had a TKA 5/14;  has a past medical history of Anxiety; Cancer ([place]); Hypertension; Hypothyroidism; and Primary localized osteoarthritis of left knee.     PT Comments    Today's skilled session focused on gait training and HEP. Pt ambulated 8 ft in room with min A +2 for stability and line management. Reviewed HEP with pt. Pt requiring assist to perform knee flexion and keeps knee in extended position unless cued to bend it. SpO2 dropped to 87% when seated and on RA. 2L of O2 was placed back on pt and SpO2 remained at a therapeutic level during mobilities. HR elevated to 140 BMP during ambulation. Patient would benefit from continued skilled PT to increase activity tolerance and independence. Will continue to follow acutely.     Follow Up Recommendations  CIR     Equipment Recommendations  Rolling walker with 5" wheels;3in1 (PT)    Recommendations for Other Services OT consult;Rehab consult     Precautions / Restrictions Precautions Precautions: Knee;Fall Precaution Booklet Issued: No Required Braces or Orthoses: Knee Immobilizer - Left Knee Immobilizer - Left: On when out of bed or walking Restrictions Weight Bearing Restrictions: No LLE Weight Bearing: Weight bearing as tolerated    Mobility  Bed Mobility               General bed mobility comments: in chair on arrival  Transfers Overall transfer level: Needs assistance Equipment used: Rolling walker (2 wheeled) Transfers: Sit to/from Stand Sit to Stand: +2 physical assistance;Mod assist         General transfer comment: Mod assist to power up into standing and to control  descent to chair  Ambulation/Gait Ambulation/Gait assistance: Min assist Ambulation Distance (Feet): 8 Feet Assistive device: Rolling walker (2 wheeled) Gait Pattern/deviations: Step-through pattern;Trunk flexed;Decreased step length - right;Decreased step length - left (decreased knee flexion on L) Gait velocity: slowed Gait velocity interpretation: Below normal speed for age/gender General Gait Details: Pt ambulated in room. HR increased to 140 and SpO2 dropped to 92. Min assist for stability and cueing for postural control.   Stairs            Wheelchair Mobility    Modified Rankin (Stroke Patients Only)       Balance Overall balance assessment: Needs assistance Sitting-balance support: No upper extremity supported;Feet supported Sitting balance-Leahy Scale: Good     Standing balance support: During functional activity;No upper extremity supported Standing balance-Leahy Scale: Poor (reliant on RW for support) Standing balance comment: able to stand without support and don bathrobe without support                            Cognition Arousal/Alertness: Awake/alert Behavior During Therapy: WFL for tasks assessed/performed Overall Cognitive Status: Within Functional Limits for tasks assessed                                        Exercises Total Joint Exercises Heel Slides: AAROM;Left;10 reps;Seated (towel under foot) Long Arc Quad: Left;10 reps;Seated;AAROM (Assist given for knee flexion) Knee Flexion: AAROM;Left;Seated;10 reps    General Comments  Pertinent Vitals/Pain Pain Assessment: No/denies pain Pain Score: 7  Pain Location: Abdomen pain Pain Descriptors / Indicators: Operative site guarding;Discomfort;Constant Pain Intervention(s): Monitored during session;Premedicated before session;Repositioned;Limited activity within patient's tolerance    Home Living Family/patient expects to be discharged to:: Private  residence Living Arrangements: Spouse/significant other Available Help at Discharge: Family;Available 24 hours/day Type of Home: House Home Access: Stairs to enter Entrance Stairs-Rails: None Home Layout: One level Home Equipment: Environmental consultant - 2 wheels;Bedside commode Additional Comments: Homehealth prior to admission for L TKA    Prior Function Level of Independence: Independent          PT Goals (current goals can now be found in the care plan section) Acute Rehab PT Goals Patient Stated Goal: be able to move well enough to get home PT Goal Formulation: With patient Time For Goal Achievement: 04/01/17 Potential to Achieve Goals: Good Progress towards PT goals: Progressing toward goals    Frequency    7X/week      PT Plan Current plan remains appropriate    Co-evaluation              AM-PAC PT "6 Clicks" Daily Activity  Outcome Measure  Difficulty turning over in bed (including adjusting bedclothes, sheets and blankets)?: Total Difficulty moving from lying on back to sitting on the side of the bed? : Total Difficulty sitting down on and standing up from a chair with arms (e.g., wheelchair, bedside commode, etc,.)?: Total Help needed moving to and from a bed to chair (including a wheelchair)?: A Lot Help needed walking in hospital room?: A Lot Help needed climbing 3-5 steps with a railing? : Total 6 Click Score: 8    End of Session Equipment Utilized During Treatment: Gait belt Activity Tolerance: Patient tolerated treatment well Patient left: in chair;with call bell/phone within reach;with family/visitor present Nurse Communication: Mobility status PT Visit Diagnosis: Other abnormalities of gait and mobility (R26.89);Pain Pain - Right/Left: Left (abdomen ) Pain - part of body: Knee     Time: 4825-0037 PT Time Calculation (min) (ACUTE ONLY): 27 min  Charges:  $Gait Training: 8-22 mins $Therapeutic Exercise: 8-22 mins                    G Codes:        Benjiman Core, PTA Acute Rehab [place] 03/19/2017, 1:42 PM

## 2017-03-20 MED ORDER — HYDROMORPHONE HCL 1 MG/ML IJ SOLN: 1.0000 mg | INTRAMUSCULAR | Status: DC | PRN

## 2017-03-20 MED ORDER — ENOXAPARIN SODIUM 60 MG/0.6ML ~~LOC~~ SOLN
50.0000 mg | SUBCUTANEOUS | Status: DC
  Administered 2017-03-21 – 2017-03-23 (×3): 50 mg via SUBCUTANEOUS
  Filled 2017-03-20 (×3): qty 0.6

## 2017-03-20 MED ORDER — OXYCODONE HCL 5 MG PO TABS
5.0000 mg | ORAL_TABLET | ORAL | Status: DC | PRN
  Administered 2017-03-20 – 2017-03-21 (×6): 10 mg via ORAL
  Filled 2017-03-20 (×6): qty 2

## 2017-03-20 NOTE — Progress Notes (Signed)
Orthopedic Tech Progress Note Patient Details:  [name] 12/31/1960 [id number]  Patient ID: [name], female   DOB: [birth year], 56 y.o.   MRN: [id number]   Hildred Priest 03/20/2017, 1:23 PM Placed pt's lle on cpm @0 -90 degrees @1325 ; RN notified

## 2017-03-20 NOTE — Progress Notes (Signed)
Occupational Therapy Treatment Patient Details Name: [name] MRN: [id number] DOB: Jun 13, 1961 Today's Date: 03/20/2017    History of present illness Admitted with peritonitis secondary to perforated sigmoid diverticulitis; now 5/19 s/p exploratory laparotomy, sigmoid colectomy, creation of descending colostomy, VAC placement; Of note, she had a TKA 5/14;  has a past medical history of Anxiety; Cancer ([place]); Hypertension; Hypothyroidism; and Primary localized osteoarthritis of left knee.    OT comments  Pt completed bath with daughter supine upon OT arrival. Pt encouraged to complete meals and grooming in recliner from now on to increase activity tolerance. Pt demonstrate transfer with RW that will allow patient to reach bathroom level next session. Daughter expressed wanting to wash patients hair. Next session attempt to wash hair in bathroom level with hand held shower head.   Follow Up Recommendations  CIR    Equipment Recommendations  3 in 1 bedside commode    Recommendations for Other Services      Precautions / Restrictions Precautions Precautions: Knee;Fall Precaution Booklet Issued: No Required Braces or Orthoses: Other Brace/Splint;Knee Immobilizer - Left Knee Immobilizer - Left: On when out of bed or walking Other Brace/Splint: abdominal binder Restrictions Weight Bearing Restrictions: No LLE Weight Bearing: Weight bearing as tolerated       Mobility Bed Mobility Overal bed mobility: Needs Assistance Bed Mobility: Supine to Sit;Rolling Rolling: Max assist Sidelying to sit: Mod assist Supine to sit: Max assist     General bed mobility comments: pt reaching for all environmental supports and need max cues for safety and sequence  Transfers Overall transfer level: Needs assistance Equipment used: Rolling walker (2 wheeled) Transfers: Sit to/from Stand Sit to Stand: Mod assist         General transfer comment: pt needs (A) initially to power up total +2 Min  (A). pt once standing mod (A) for safety with RW    Balance Overall balance assessment: Needs assistance Sitting-balance support: Bilateral upper extremity supported;Feet supported Sitting balance-Leahy Scale: Fair     Standing balance support: Bilateral upper extremity supported;During functional activity Standing balance-Leahy Scale: Poor                             ADL either performed or assessed with clinical judgement   ADL Overall ADL's : Needs assistance/impaired Eating/Feeding: Modified independent;Bed level   Grooming: Wash/dry hands;Wash/dry face;Set up;Bed level   Upper Body Bathing: Maximal assistance;Bed level   Lower Body Bathing: Total assistance;Bed level Lower Body Bathing Details (indicate cue type and reason): daughter (A) on arrival         Toilet Transfer: Minimal assistance;+2 for physical assistance           Functional mobility during ADLs: Moderate assistance;Rolling walker General ADL Comments: Pt able to power up better today and more alert. Pt with PCA button d/c this session and much more alert     Vision       Perception     Praxis      Cognition Arousal/Alertness: Awake/alert Behavior During Therapy: WFL for tasks assessed/performed Overall Cognitive Status: Within Functional Limits for tasks assessed                                          Exercises Exercises: Total Joint Total Joint Exercises Heel Slides: AAROM;Left;10 reps;Supine (To improve knee flexion)   Shoulder Instructions  General Comments      Pertinent Vitals/ Pain       Pain Assessment: Faces Faces Pain Scale: Hurts even more Pain Location: Abdomen pain Pain Descriptors / Indicators: Grimacing;Guarding;Discomfort Pain Intervention(s): Limited activity within patient's tolerance;Monitored during session;Premedicated before session;Repositioned  Home Living                                           Prior Functioning/Environment              Frequency  Min 2X/week        Progress Toward Goals  OT Goals(current goals can now be found in the care plan section)  Progress towards OT goals: Progressing toward goals  Acute Rehab OT Goals Patient Stated Goal: be able to move well enough to get home OT Goal Formulation: With patient/family Time For Goal Achievement: 04/02/17 Potential to Achieve Goals: Good ADL Goals Pt Will Perform Upper Body Bathing: with supervision;sitting Pt Will Perform Lower Body Bathing: with mod assist;sit to/from stand Pt Will Transfer to Toilet: with mod assist;bedside commode;stand pivot transfer Additional ADL Goal #1: Pt will complete bed mobility min (A) as precursor to adls.   Plan Discharge plan remains appropriate    Co-evaluation                 AM-PAC PT "6 Clicks" Daily Activity     Outcome Measure   Help from another person eating meals?: None Help from another person taking care of personal grooming?: A Little Help from another person toileting, which includes using toliet, bedpan, or urinal?: A Lot Help from another person bathing (including washing, rinsing, drying)?: A Lot Help from another person to put on and taking off regular upper body clothing?: A Lot Help from another person to put on and taking off regular lower body clothing?: A Lot 6 Click Score: 15    End of Session Equipment Utilized During Treatment: Gait belt;Rolling walker CPM Left Knee CPM Left Knee: Off  OT Visit Diagnosis: Unsteadiness on feet (R26.81) Pain - Right/Left: Left Pain - part of body: Knee   Activity Tolerance Patient tolerated treatment well   Patient Left in chair;with call bell/phone within reach;with chair alarm set;with family/visitor present   Nurse Communication Mobility status;Precautions        Time: 1100-1135 OT Time Calculation (min): 35 min  Charges: OT General Charges $OT Visit: 1 Procedure OT  Treatments $Self Care/Home Management : 23-37 mins   Jeri Modena   OTR/L Pager: [phone] Office: [phone] .    Parke Poisson B 03/20/2017, 3:39 PM

## 2017-03-20 NOTE — Consult Note (Signed)
[place] Nurse ostomy consult note Stoma type/location: LLQ, end colostomy Stomal assessment/size: oval shaped, the pouch is intact today Peristomal assessment: NA Treatment options for stomal/peristomal skin: using barrier ring and convexity to aid in seal with abdominal contour issues. Output serous, ostomy sweat, +flatus Ostomy pouching: 1pc.flexible convex  Education provided: met with patient and her daughter today, pouch intact from yesterday.  Answered questions about supplies, will mark [employer] ostomy catalog for them once we see if the flexible convex is going to work for her.  Enrolled patient in [place] program: No, permission card signed holding off until we evaluate the effectiveness of the current pouching situation.  Will plan to change pouch again in the am with NPWT VAC dressing change.   [place], [place]

## 2017-03-20 NOTE — Progress Notes (Signed)
Physical Therapy Treatment Patient Details Name: [name] MRN: [id number] DOB: 1961/09/08 Today's Date: 03/20/2017    History of Present Illness Admitted with peritonitis secondary to perforated sigmoid diverticulitis; now 5/19 s/p exploratory laparotomy, sigmoid colectomy, creation of descending colostomy, VAC placement; Of note, she had a TKA 5/14;  has a past medical history of Anxiety; Cancer ([place]); Hypertension; Hypothyroidism; and Primary localized osteoarthritis of left knee.     PT Comments    Patient continues to show improvement as she ambulated farther today than previous session. She ambulated 100 ft in hallway but was limited by abdominal pain. Believe once abdominal binder is in use she will be able to progress farther. She is progressing towards goals and should benefit from continued PT to achieve unmet goals. Will continue to follow acutely.    Follow Up Recommendations  CIR     Equipment Recommendations  Rolling walker with 5" wheels;3in1 (PT)    Recommendations for Other Services OT consult;Rehab consult     Precautions / Restrictions Precautions Precautions: Knee;Fall Precaution Booklet Issued: No Required Braces or Orthoses: Other Brace/Splint;Knee Immobilizer - Left Knee Immobilizer - Left: On when out of bed or walking Other Brace/Splint: abdominal binder Restrictions Weight Bearing Restrictions: No LLE Weight Bearing: Weight bearing as tolerated    Mobility  Bed Mobility Overal bed mobility: Needs Assistance Bed Mobility: Supine to Sit   Sidelying to sit: Mod assist       General bed mobility comments: Mod assist to negotiate L LE off edge of bed and to elevate trunk, with HOB elevated.  Transfers Overall transfer level: Needs assistance Equipment used: Rolling walker (2 wheeled) Transfers: Sit to/from Stand Sit to Stand: Mod assist         General transfer comment: Mod assist to power up into standing and to control descent to  chair  Ambulation/Gait Ambulation/Gait assistance: Min assist;+2 safety/equipment (Chair to follow) Ambulation Distance (Feet): 100 Feet Assistive device: Rolling walker (2 wheeled) Gait Pattern/deviations: Step-through pattern;Trunk flexed;Decreased step length - right;Decreased step length - left (decreased knee flexion on L) Gait velocity: slowed Gait velocity interpretation: Below normal speed for age/gender General Gait Details: Pt ambulated 100 ft in hallway. Limited by abdominal pain. Abdominal binder not present in room. Spoke with RN and it is being ordered.   Stairs            Wheelchair Mobility    Modified Rankin (Stroke Patients Only)       Balance Overall balance assessment: Needs assistance Sitting-balance support: No upper extremity supported;Feet supported Sitting balance-Leahy Scale: Good     Standing balance support: During functional activity;No upper extremity supported Standing balance-Leahy Scale: Poor (reliant on RW for support)                              Cognition Arousal/Alertness: Awake/alert Behavior During Therapy: WFL for tasks assessed/performed Overall Cognitive Status: Within Functional Limits for tasks assessed                                        Exercises Total Joint Exercises Heel Slides: AAROM;Left;10 reps;Supine (To improve knee flexion)    General Comments        Pertinent Vitals/Pain Pain Assessment: Faces Faces Pain Scale: Hurts even more Pain Location: Abdomen pain Pain Descriptors / Indicators: Grimacing;Guarding;Discomfort Pain Intervention(s): Limited activity within patient's  tolerance;Monitored during session;Repositioned    Home Living                      Prior Function            PT Goals (current goals can now be found in the care plan section) Acute Rehab PT Goals Patient Stated Goal: be able to move well enough to get home PT Goal Formulation: With  patient Time For Goal Achievement: 04/01/17 Potential to Achieve Goals: Good Progress towards PT goals: Progressing toward goals    Frequency    7X/week      PT Plan Current plan remains appropriate    Co-evaluation              AM-PAC PT "6 Clicks" Daily Activity  Outcome Measure  Difficulty turning over in bed (including adjusting bedclothes, sheets and blankets)?: Total Difficulty moving from lying on back to sitting on the side of the bed? : Total Difficulty sitting down on and standing up from a chair with arms (e.g., wheelchair, bedside commode, etc,.)?: Total Help needed moving to and from a bed to chair (including a wheelchair)?: A Lot Help needed walking in hospital room?: A Little Help needed climbing 3-5 steps with a railing? : Total 6 Click Score: 9    End of Session Equipment Utilized During Treatment: Gait belt Activity Tolerance: Patient tolerated treatment well;Patient limited by pain Patient left: in chair;with call bell/phone within reach;with chair alarm set Nurse Communication: Mobility status;Other (comment) (need for abdominal binder) PT Visit Diagnosis: Other abnormalities of gait and mobility (R26.89);Pain Pain - Right/Left: Left (abdomen ) Pain - part of body: Knee     Time: 1402-1440 PT Time Calculation (min) (ACUTE ONLY): 38 min  Charges:  $Gait Training: 23-37 mins $Therapeutic Exercise: 8-22 mins                    G Codes:       Benjiman Core, PTA Acute Rehab [place] 03/20/2017, 2:56 PM

## 2017-03-20 NOTE — Progress Notes (Deleted)
Dilaudid PCA 15mg  (43ml) wasted in sink with Polly Cobia, RN.

## 2017-03-20 NOTE — Progress Notes (Signed)
[facility] Progress Note  3 Days Post-Op  Subjective: CC: abdominal pain Patient feels like she is doing well. Pain is improving.Denies N/V. UOP good. Passing gas via stoma, no BM yet.  Febrile overnight 101.5, afebrile currently.  Objective: Vital signs in last 24 hours: Temp:  [98.4 F (36.9 C)-101.5 F (38.6 C)] 98.6 F (37 C) (05/22 0450) Pulse Rate:  [97-120] 102 (05/22 0450) Resp:  [14-19] 19 (05/22 0450) BP: (114-127)/(51-80) 120/51 (05/22 0450) SpO2:  [92 %-98 %] 93 % (05/22 0450) Last BM Date: 03/14/17  Intake/Output from previous day: 05/21 0701 - 05/22 0700 In: 4005 [P.O.:1080; I.V.:2325; IV Piggyback:600] Out: 1400 [Urine:1375; Drains:25] Intake/Output this shift: Total I/O In: -  Out: 500 [Urine:500]  PE: Gen:  Alert, NAD, pleasant Card:  Regular rate and rhythm,  Pulm:  Normal effort, clear to auscultation bilaterally Abd: Soft, appropriately TTP, non-distended, bowel sounds hypoactive in all 4 quadrants, incisions C/D/I. Stoma pink, gas in ostomy pouch.  Skin: warm and dry, no rashes  Psych: A&Ox3   Lab Results:   Recent Labs  03/18/17 0427 03/19/17 0430  WBC 16.9* 15.7*  HGB 9.7* 9.3*  HCT 30.8* 29.2*  PLT 180 215   BMET  Recent Labs  03/18/17 0427 03/19/17 0430  NA 137 134*  K 4.3 4.0  CL 102 102  CO2 27 25  GLUCOSE 106* 114*  BUN 8 8  CREATININE 0.73 0.48  CALCIUM 7.6* 7.6*   PT/INR No results for input(s): LABPROT, INR in the last 72 hours. CMP     Component Value Date/Time   NA 134 (L) 03/19/2017 0430   K 4.0 03/19/2017 0430   CL 102 03/19/2017 0430   CO2 25 03/19/2017 0430   GLUCOSE 114 (H) 03/19/2017 0430   BUN 8 03/19/2017 0430   CREATININE 0.48 03/19/2017 0430   CALCIUM 7.6 (L) 03/19/2017 0430   PROT 7.2 03/16/2017 2347   ALBUMIN 3.3 (L) 03/16/2017 2347   AST 39 03/16/2017 2347   ALT 44 03/16/2017 2347   ALKPHOS 52 03/16/2017 2347   BILITOT 0.9 03/16/2017 2347   GFRNONAA >60 03/19/2017 0430   GFRAA >60 03/19/2017 0430   Lipase     Component Value Date/Time   LIPASE 16 03/16/2017 2347       Studies/Results: No results found.  Anti-infectives: Anti-infectives    Start     Dose/Rate Route Frequency Ordered Stop   03/19/17 2000  vancomycin (VANCOCIN) 1,250 mg in sodium chloride 0.9 % 250 mL IVPB     1,250 mg 166.7 mL/hr over 90 Minutes Intravenous Every 12 hours 03/19/17 1326     03/17/17 1200  vancomycin (VANCOCIN) IVPB 750 mg/150 ml premix  Status:  Discontinued     750 mg 150 mL/hr over 60 Minutes Intravenous Every 12 hours 03/17/17 0347 03/19/17 1326   03/17/17 1000  piperacillin-tazobactam (ZOSYN) IVPB 3.375 g     3.375 g 12.5 mL/hr over 240 Minutes Intravenous Every 8 hours 03/17/17 0347     03/17/17 0100  vancomycin (VANCOCIN) 2,000 mg in sodium chloride 0.9 % 500 mL IVPB     2,000 mg 250 mL/hr over 120 Minutes Intravenous  Once 03/17/17 0052 03/17/17 0423   03/17/17 0015  piperacillin-tazobactam (ZOSYN) IVPB 3.375 g     3.375 g 100 mL/hr over 30 Minutes Intravenous  Once 03/17/17 0004 03/17/17 0242   03/17/17 0015  vancomycin (VANCOCIN) IVPB 1000 mg/200 mL premix  Status:  Discontinued     1,000 mg 200 mL/hr  over 60 Minutes Intravenous  Once 03/17/17 0004 03/17/17 0220       Assessment/Plan S/P Sigmoid colon resection and creation of colostomy - Continue abx - continue full liquids, can advance to SOFT when patient has BM - Will D/C PCA today and convert to PO pain management  S/P left knee arthroplasty 5/14 Dr Noemi Chapel - ortho seeing - CPM, therapies  FEN - fulls, Oxy IR 5-10 mg q4 PRN, IV Dilaudid for breakthrough VTE - lovenox, SCDs ID - Zosyn (5/19>) and Vanc (5/19>)  LOS: 3 days    Brigid Re , [facility] 03/20/2017, 7:47 AM Pager: [phone] Consults: [phone] Mon-Fri 7:00 am-4:30 pm Sat-Sun 7:00 am-11:30 am

## 2017-03-20 NOTE — Progress Notes (Addendum)
Dilaudid 15mg  (56ml) wasted in sink. Witnessed by Polly Cobia, RN [phone]).

## 2017-03-20 NOTE — Progress Notes (Signed)
Subjective: 3 Days Post-Op Procedure(s) (LRB): COLON RESECTION SIGMOID AND CREATION OF COLOSTOMY (N/A)  Post op day 8 from left total knee Patient reports pain as 7 on 0-10 scale.    Objective: Vital signs in last 24 hours: Temp:  [98.4 F (36.9 C)-101.5 F (38.6 C)] 98.6 F (37 C) (05/22 0450) Pulse Rate:  [97-120] 102 (05/22 0450) Resp:  [14-19] 19 (05/22 0450) BP: (114-127)/(51-80) 120/51 (05/22 0450) SpO2:  [92 %-98 %] 93 % (05/22 0450)  Intake/Output from previous day: 05/21 0701 - 05/22 0700 In: 4005 [P.O.:1080; I.V.:2325; IV Piggyback:600] Out: 1400 [Urine:1375; Drains:25] Intake/Output this shift: Total I/O In: -  Out: 500 [Urine:500]   Recent Labs  03/17/17 1117 03/18/17 0427 03/19/17 0430  HGB 10.3* 9.7* 9.3*    Recent Labs  03/18/17 0427 03/19/17 0430  WBC 16.9* 15.7*  RBC 3.09* 2.98*  HCT 30.8* 29.2*  PLT 180 215    Recent Labs  03/18/17 0427 03/19/17 0430  NA 137 134*  K 4.3 4.0  CL 102 102  CO2 27 25  BUN 8 8  CREATININE 0.73 0.48  GLUCOSE 106* 114*  CALCIUM 7.6* 7.6*   No results for input(s): LABPT, INR in the last 72 hours.  ABD soft Neurovascular intact Sensation intact distally Intact pulses distally Dorsiflexion/Plantar flexion intact Incision: dressing C/D/I  Knee dressing looks good  Not removed today   Aquacel placed on left knee on 03/19/2017  Assessment/Plan: 3 Days Post-Op Procedure(s) (LRB): COLON RESECTION SIGMOID AND CREATION OF COLOSTOMY (N/A)  8 Days Post-op left total knee Up with therapy  Agree with General Surgery, PT and OT that she would be an excellent candidate for Inpatient rehab  [facility] J 03/20/2017, 8:34 AM

## 2017-03-20 NOTE — Progress Notes (Signed)
I met with pt and daughter at bedside to discuss a possible inpt rehab admit pending insurance and bed availability when pt medically ready. Pt taking liquids at present. Pt prefers an inpt rehab admit rather than SNF. I will follow and begin insurance authorization for hopeful admit later this week if ready. [phone]

## 2017-03-21 LAB — CBC WITH DIFFERENTIAL/PLATELET
BASOS PCT: 0 %
Basophils Absolute: 0 10*3/uL (ref 0.0–0.1)
EOS PCT: 1 %
Eosinophils Absolute: 0.2 10*3/uL (ref 0.0–0.7)
HEMATOCRIT: 26.7 % — AB (ref 36.0–46.0)
HEMOGLOBIN: 8.6 g/dL — AB (ref 12.0–15.0)
LYMPHS ABS: 1.7 10*3/uL (ref 0.7–4.0)
Lymphocytes Relative: 10 %
MCH: 32.1 pg (ref 26.0–34.0)
MCHC: 32.2 g/dL (ref 30.0–36.0)
MCV: 99.6 fL (ref 78.0–100.0)
Monocytes Absolute: 0.7 10*3/uL (ref 0.1–1.0)
Monocytes Relative: 4 %
NEUTROS ABS: 14.5 10*3/uL — AB (ref 1.7–7.7)
Neutrophils Relative %: 85 %
Platelets: 260 10*3/uL (ref 150–400)
RBC: 2.68 MIL/uL — ABNORMAL LOW (ref 3.87–5.11)
RDW: 15 % (ref 11.5–15.5)
WBC: 17.1 10*3/uL — ABNORMAL HIGH (ref 4.0–10.5)

## 2017-03-21 NOTE — Progress Notes (Signed)
I have received insurance approval to admit pt to inpt rehab pending medical readiness to admit. Noted pt eating spaghetti this afternoon. Please clarify when felt pt medically ready to d/c to CIR. I iwll follow up tomorrow. [phone]

## 2017-03-21 NOTE — Progress Notes (Signed)
Orthopedic Tech Progress Note Patient Details:  [name] [birth year] [id number]  Ortho Devices Type of Ortho Device:  (footsie roll) Ortho Device/Splint Location: on cpm at 2210 Ortho Device/Splint Interventions: Application   Braulio Bosch 03/21/2017, 10:13 PM

## 2017-03-21 NOTE — Progress Notes (Signed)
[facility] Progress Note  4 Days Post-Op  Subjective: CC: headache Patient passing flatus via stoma, some liquid output in pouch this AM. Pain well controlled on PO pain medication. Denies N/V.  UOP good. VSS.   Objective: Vital signs in last 24 hours: Temp:  [98.6 F (37 C)-99.6 F (37.6 C)] 99 F (37.2 C) (05/23 0441) Pulse Rate:  [102-106] 102 (05/23 0441) Resp:  [17-19] 17 (05/23 0441) BP: (109-117)/(48-86) 111/48 (05/23 0441) SpO2:  [93 %-95 %] 93 % (05/23 0441) Last BM Date: 03/14/17  Intake/Output from previous day: 05/22 0701 - 05/23 0700 In: 2982.5 [P.O.:1500; I.V.:882.5; IV Piggyback:600] Out: 0923 [Urine:3125; Drains:50] Intake/Output this shift: No intake/output data recorded.  PE: Gen:  Alert, NAD, pleasant Card:  Regular rate and rhythm Pulm:  Normal effort, clear to auscultation bilaterally Abd: Soft, appropriately TTP, non-distended, bowel sounds present in all 4 quadrants, VAC midline and suctioning well. Stoma pale, non-tender, small amt liquid output in pouch Skin: warm and dry, no rashes  Psych: A&Ox3   Lab Results:   Recent Labs  03/19/17 0430  WBC 15.7*  HGB 9.3*  HCT 29.2*  PLT 215   BMET  Recent Labs  03/19/17 0430  NA 134*  K 4.0  CL 102  CO2 25  GLUCOSE 114*  BUN 8  CREATININE 0.48  CALCIUM 7.6*   PT/INR No results for input(s): LABPROT, INR in the last 72 hours. CMP     Component Value Date/Time   NA 134 (L) 03/19/2017 0430   K 4.0 03/19/2017 0430   CL 102 03/19/2017 0430   CO2 25 03/19/2017 0430   GLUCOSE 114 (H) 03/19/2017 0430   BUN 8 03/19/2017 0430   CREATININE 0.48 03/19/2017 0430   CALCIUM 7.6 (L) 03/19/2017 0430   PROT 7.2 03/16/2017 2347   ALBUMIN 3.3 (L) 03/16/2017 2347   AST 39 03/16/2017 2347   ALT 44 03/16/2017 2347   ALKPHOS 52 03/16/2017 2347   BILITOT 0.9 03/16/2017 2347   GFRNONAA >60 03/19/2017 0430   GFRAA >60 03/19/2017 0430   Lipase     Component Value Date/Time   LIPASE  16 03/16/2017 2347       Studies/Results: No results found.  Anti-infectives: Anti-infectives    Start     Dose/Rate Route Frequency Ordered Stop   03/19/17 2000  vancomycin (VANCOCIN) 1,250 mg in sodium chloride 0.9 % 250 mL IVPB     1,250 mg 166.7 mL/hr over 90 Minutes Intravenous Every 12 hours 03/19/17 1326     03/17/17 1200  vancomycin (VANCOCIN) IVPB 750 mg/150 ml premix  Status:  Discontinued     750 mg 150 mL/hr over 60 Minutes Intravenous Every 12 hours 03/17/17 0347 03/19/17 1326   03/17/17 1000  piperacillin-tazobactam (ZOSYN) IVPB 3.375 g     3.375 g 12.5 mL/hr over 240 Minutes Intravenous Every 8 hours 03/17/17 0347     03/17/17 0100  vancomycin (VANCOCIN) 2,000 mg in sodium chloride 0.9 % 500 mL IVPB     2,000 mg 250 mL/hr over 120 Minutes Intravenous  Once 03/17/17 0052 03/17/17 0423   03/17/17 0015  piperacillin-tazobactam (ZOSYN) IVPB 3.375 g     3.375 g 100 mL/hr over 30 Minutes Intravenous  Once 03/17/17 0004 03/17/17 0242   03/17/17 0015  vancomycin (VANCOCIN) IVPB 1000 mg/200 mL premix  Status:  Discontinued     1,000 mg 200 mL/hr over 60 Minutes Intravenous  Once 03/17/17 0004 03/17/17 0220  Assessment/Plan S/P Sigmoid colon resection and creation of colostomy - Continue abx - continue full liquids, can advance to SOFT when patient has BM - stoma pale. Patient not tender, +BS, liquid output.  Will reexamine with WOC, MD aware.   S/P left knee arthroplasty 5/14 Dr Noemi Chapel - ortho seeing - CPM, therapies  FEN - fulls, Oxy IR 5-10 mg q4 PRN, IV Dilaudid for breakthrough VTE - lovenox, SCDs ID - Zosyn (5/19>) and Vanc (5/19>)  LOS: 4 days    Brigid Re , [facility] 03/21/2017, 8:10 AM Pager: [phone] Consults: [phone] Mon-Fri 7:00 am-4:30 pm Sat-Sun 7:00 am-11:30 am

## 2017-03-21 NOTE — Progress Notes (Signed)
Subjective: 4 Days Post-Op Procedure(s) (LRB): COLON RESECTION SIGMOID AND CREATION OF COLOSTOMY (N/A)  9 days Post-Op left total knee replacement Patient reports pain as 4 on 0-10 scale.    Objective: Vital signs in last 24 hours: Temp:  [98.6 F (37 C)-99.6 F (37.6 C)] 99 F (37.2 C) (05/23 0441) Pulse Rate:  [102-106] 102 (05/23 0441) Resp:  [17-19] 17 (05/23 0441) BP: (109-117)/(48-86) 111/48 (05/23 0441) SpO2:  [93 %-95 %] 93 % (05/23 0441)  Intake/Output from previous day: 05/22 0701 - 05/23 0700 In: 2982.5 [P.O.:1500; I.V.:882.5; IV Piggyback:600] Out: 0867 [Urine:3125; Drains:50] Intake/Output this shift: No intake/output data recorded.   Recent Labs  03/19/17 0430  HGB 9.3*    Recent Labs  03/19/17 0430  WBC 15.7*  RBC 2.98*  HCT 29.2*  PLT 215    Recent Labs  03/19/17 0430  NA 134*  K 4.0  CL 102  CO2 25  BUN 8  CREATININE 0.48  GLUCOSE 114*  CALCIUM 7.6*   No results for input(s): LABPT, INR in the last 72 hours.  ABD soft Neurovascular intact Sensation intact distally Intact pulses distally Dorsiflexion/Plantar flexion intact Incision: dressing C/D/I  Assessment/Plan: 4 Days Post-Op Procedure(s) (LRB): COLON RESECTION SIGMOID AND CREATION OF COLOSTOMY (N/A) 9 days Post-Op left total knee replacement  Continue medical and surgical management per CCS.  Left total knee wound continues to do well   Patient is tolerating CPM and ZDK bone foam.  Patient is still very limited in mobility requiring 2+ assist to get out of bed and out of a chair due to abdominal incision.  Once up she is a very active participant in PT and OT and would benefit greatly from inpatient rehab.  Still no BM but is passing gas  Earl Losee J 03/21/2017, 7:32 AM

## 2017-03-21 NOTE — Progress Notes (Signed)
Physical Therapy Treatment Patient Details Name: [name] MRN: [id number] DOB: May 29, 1961 Today's Date: 03/21/2017    History of Present Illness Admitted with peritonitis secondary to perforated sigmoid diverticulitis; now 5/19 s/p exploratory laparotomy, sigmoid colectomy, creation of descending colostomy, VAC placement; Of note, she had a TKA 5/14;  has a past medical history of Anxiety; Cancer ([place]); Hypertension; Hypothyroidism; and Primary localized osteoarthritis of left knee.     PT Comments    Pt is making good progress towards her goals. Pt currently, mod A for bed mobility, and minAx2 for sit>stand transfer to RW and minAx1 for ambulation of 150 feet with RW. Pt requires skilled PT to progress bed, transfer and gait training and to improve LE strength and endurance to be able to safely navigate her discharge environment.    Follow Up Recommendations  CIR     Equipment Recommendations  Rolling walker with 5" wheels;3in1 (PT)    Recommendations for Other Services OT consult;Rehab consult     Precautions / Restrictions Precautions Precautions: Knee;Fall Precaution Booklet Issued: No Required Braces or Orthoses: Other Brace/Splint;Knee Immobilizer - Left Knee Immobilizer - Left: On when out of bed or walking Other Brace/Splint: abdominal binder Restrictions Weight Bearing Restrictions: No LLE Weight Bearing: Weight bearing as tolerated    Mobility  Bed Mobility Overal bed mobility: Needs Assistance Bed Mobility: Supine to Sit   Sidelying to sit: Mod assist       General bed mobility comments: Mod assist to elevate trunk, with HOB elevated.  Transfers Overall transfer level: Needs assistance Equipment used: Rolling walker (2 wheeled) Transfers: Sit to/from Stand Sit to Stand: Min guard;+2 physical assistance         General transfer comment: minAx2 for powerup and steadying in RW  Ambulation/Gait Ambulation/Gait assistance: Min assist;+2  safety/equipment (Chair to follow)   Assistive device: Rolling walker (2 wheeled) Gait Pattern/deviations: Step-through pattern;Trunk flexed;Decreased step length - right;Decreased step length - left (decreased knee flexion on L) Gait velocity: slowed Gait velocity interpretation: Below normal speed for age/gender General Gait Details: 150          Balance Overall balance assessment: Needs assistance Sitting-balance support: No upper extremity supported;Feet supported Sitting balance-Leahy Scale: Good     Standing balance support: During functional activity;No upper extremity supported Standing balance-Leahy Scale: Poor (reliant on RW for support) Standing balance comment: requires RW for support                            Cognition Arousal/Alertness: Awake/alert Behavior During Therapy: WFL for tasks assessed/performed Overall Cognitive Status: Within Functional Limits for tasks assessed                                        Exercises Total Joint Exercises Heel Slides: AAROM;Left;10 reps;Supine (To improve knee flexion)        Pertinent Vitals/Pain Pain Assessment: 0-10 Pain Score: 3  Pain Location: Abdomen pain Pain Descriptors / Indicators: Grimacing;Guarding;Discomfort Pain Intervention(s): Monitored during session;Repositioned  VSS           PT Goals (current goals can now be found in the care plan section) Acute Rehab PT Goals Patient Stated Goal: be able to move well enough to get home PT Goal Formulation: With patient Time For Goal Achievement: 04/01/17 Potential to Achieve Goals: Good Progress towards PT goals: Progressing toward goals  Frequency    7X/week      PT Plan Current plan remains appropriate       AM-PAC PT "6 Clicks" Daily Activity  Outcome Measure  Difficulty turning over in bed (including adjusting bedclothes, sheets and blankets)?: Total Difficulty moving from lying on back to sitting on the  side of the bed? : Total Difficulty sitting down on and standing up from a chair with arms (e.g., wheelchair, bedside commode, etc,.)?: Total Help needed moving to and from a bed to chair (including a wheelchair)?: A Lot Help needed walking in hospital room?: A Little Help needed climbing 3-5 steps with a railing? : Total 6 Click Score: 9    End of Session Equipment Utilized During Treatment: Gait belt Activity Tolerance: Patient tolerated treatment well;Patient limited by pain Patient left: in chair;with call bell/phone within reach;with chair alarm set Nurse Communication: Mobility status;Other (comment) (need for abdominal binder) PT Visit Diagnosis: Other abnormalities of gait and mobility (R26.89);Pain Pain - Right/Left: Left (abdomen ) Pain - part of body: Knee     Time: 1518-3437 PT Time Calculation (min) (ACUTE ONLY): 34 min  Charges:  $Gait Training: 23-37 mins                    G Codes:       Dejuan Elman B. Migdalia Dk PT, DPT Acute Rehabilitation  [phone] Pager [phone]     [place] 03/21/2017, 4:16 PM

## 2017-03-22 ENCOUNTER — Inpatient Hospital Stay (HOSPITAL_COMMUNITY): Payer: 59

## 2017-03-22 LAB — URINALYSIS, ROUTINE W REFLEX MICROSCOPIC
BILIRUBIN URINE: NEGATIVE
GLUCOSE, UA: NEGATIVE mg/dL
Ketones, ur: NEGATIVE mg/dL
LEUKOCYTES UA: NEGATIVE
NITRITE: NEGATIVE
PH: 5 (ref 5.0–8.0)
Protein, ur: NEGATIVE mg/dL
SPECIFIC GRAVITY, URINE: 1.009 (ref 1.005–1.030)

## 2017-03-22 LAB — CULTURE, BLOOD (ROUTINE X 2)
CULTURE: NO GROWTH
Culture: NO GROWTH
Special Requests: ADEQUATE
Special Requests: ADEQUATE

## 2017-03-22 LAB — BASIC METABOLIC PANEL
ANION GAP: 7 (ref 5–15)
BUN: 11 mg/dL (ref 6–20)
CALCIUM: 7.9 mg/dL — AB (ref 8.9–10.3)
CO2: 28 mmol/L (ref 22–32)
Chloride: 107 mmol/L (ref 101–111)
Creatinine, Ser: 1.36 mg/dL — ABNORMAL HIGH (ref 0.44–1.00)
GFR calc non Af Amer: 42 mL/min — ABNORMAL LOW (ref >60)
GFR, EST AFRICAN AMERICAN: 49 mL/min — AB (ref >60)
GLUCOSE: 109 mg/dL — AB (ref 65–99)
POTASSIUM: 3.8 mmol/L (ref 3.5–5.1)
SODIUM: 142 mmol/L (ref 135–145)

## 2017-03-22 MED ORDER — SODIUM CHLORIDE 0.9% FLUSH: 10.0000 mL | Freq: Two times a day (BID) | INTRAVENOUS | Status: DC

## 2017-03-22 MED ORDER — SODIUM CHLORIDE 0.9% FLUSH: 10.0000 mL | INTRAVENOUS | Status: DC | PRN

## 2017-03-22 MED ORDER — IOPAMIDOL (ISOVUE-300) INJECTION 61%
INTRAVENOUS | Status: AC
  Administered 2017-03-22: 80 mL via INTRAVENOUS
  Filled 2017-03-22: qty 100

## 2017-03-22 MED ORDER — IOPAMIDOL (ISOVUE-300) INJECTION 61%
INTRAVENOUS | Status: AC
  Filled 2017-03-22: qty 30

## 2017-03-22 MED ORDER — ONDANSETRON HCL 4 MG/2ML IJ SOLN
4.0000 mg | Freq: Once | INTRAMUSCULAR | Status: AC
  Administered 2017-03-22: 4 mg via INTRAVENOUS
  Filled 2017-03-22: qty 2

## 2017-03-22 NOTE — Progress Notes (Signed)
Physical Therapy Treatment Patient Details Name: [name] MRN: [id number] DOB: 02/11/1961 Today's Date: 03/22/2017    History of Present Illness Admitted with peritonitis secondary to perforated sigmoid diverticulitis; now 5/19 s/p exploratory laparotomy, sigmoid colectomy, creation of descending colostomy, VAC placement; Of note, she had a TKA 5/14;  has a past medical history of Anxiety; Cancer ([place]); Hypertension; Hypothyroidism; and Primary localized osteoarthritis of left knee.     PT Comments    Pt is making progress towards her goals but is limited by knee pain today. Pt is currently min Ax2 for power up in sit>stand transfer and minAx1 for ambulation of 50 feet with RW and assist x1 for chair follow. Pt expresses concern that she is being treated more aggressively for her colectomy than her knee rehab. Pt encouraged to take more active role in progressing her knee ROM now that she is being less medicated and she is less lethargic. Pt instructed to work on knee flexion with active assist and knee extension using bone foam. Pt requires skilled PT to continue to progress gait training and to improve LE strength and ROM to safely navigate her home environment at discharge.    Follow Up Recommendations  CIR     Equipment Recommendations  Rolling walker with 5" wheels;3in1 (PT)    Recommendations for Other Services OT consult;Rehab consult     Precautions / Restrictions Precautions Precautions: Knee;Fall Precaution Booklet Issued: No Required Braces or Orthoses: Other Brace/Splint;Knee Immobilizer - Left Knee Immobilizer - Left: On when out of bed or walking Other Brace/Splint: abdominal binder Restrictions Weight Bearing Restrictions: No LLE Weight Bearing: Weight bearing as tolerated    Mobility  Bed Mobility               General bed mobility comments: Up in recliner at entry.   Transfers Overall transfer level: Needs assistance Equipment used: Rolling walker  (2 wheeled) Transfers: Sit to/from Stand Sit to Stand: +2 physical assistance;Min assist         General transfer comment: minAx2 for powerup and steadying in RW  Ambulation/Gait Ambulation/Gait assistance: Min assist;+2 safety/equipment (Chair to follow) Ambulation Distance (Feet): 50 Feet Assistive device: Rolling walker (2 wheeled) Gait Pattern/deviations: Step-through pattern;Trunk flexed;Decreased step length - right;Decreased step length - left (decreased knee flexion on L) Gait velocity: slowed   General Gait Details: vc for upright posture, increased heel strike and leg extension at toe off.   Stairs            Wheelchair Mobility    Modified Rankin (Stroke Patients Only)       Balance Overall balance assessment: Needs assistance Sitting-balance support: No upper extremity supported;Feet supported Sitting balance-Leahy Scale: Good     Standing balance support: During functional activity;No upper extremity supported Standing balance-Leahy Scale: Poor (reliant on RW for support) Standing balance comment: requires RW for support                            Cognition Arousal/Alertness: Awake/alert Behavior During Therapy: WFL for tasks assessed/performed Overall Cognitive Status: Within Functional Limits for tasks assessed                                        Exercises Total Joint Exercises Quad Sets: AROM;Left;10 reps;Seated Knee Flexion: AROM;20 reps;Seated;Left (AAROM with opposite leg assist 10x 5sec hold)  Pertinent Vitals/Pain Pain Assessment: 0-10 Pain Score: 8  Pain Location: Abdomen pain Pain Descriptors / Indicators: Grimacing;Guarding;Discomfort Pain Intervention(s): Monitored during session;Limited activity within patient's tolerance  VSS    Home Living     Available Help at Discharge:  (spouse may take FMLA for when pt returns home.)           Additional Comments: Homehealth prior to  admission for L TKA    Prior Function            PT Goals (current goals can now be found in the care plan section) Acute Rehab PT Goals Patient Stated Goal: be able to move well enough to get home PT Goal Formulation: With patient Time For Goal Achievement: 04/01/17 Potential to Achieve Goals: Good Progress towards PT goals: Progressing toward goals    Frequency    7X/week      PT Plan Current plan remains appropriate       AM-PAC PT "6 Clicks" Daily Activity  Outcome Measure  Difficulty turning over in bed (including adjusting bedclothes, sheets and blankets)?: Total Difficulty moving from lying on back to sitting on the side of the bed? : Total Difficulty sitting down on and standing up from a chair with arms (e.g., wheelchair, bedside commode, etc,.)?: Total Help needed moving to and from a bed to chair (including a wheelchair)?: A Lot Help needed walking in hospital room?: A Little Help needed climbing 3-5 steps with a railing? : Total 6 Click Score: 9    End of Session Equipment Utilized During Treatment: Gait belt Activity Tolerance: Patient tolerated treatment well;Patient limited by pain Patient left: in chair;with call bell/phone within reach;with chair alarm set Nurse Communication: Mobility status;Other (comment) (need for abdominal binder) PT Visit Diagnosis: Other abnormalities of gait and mobility (R26.89);Pain Pain - Right/Left: Left (abdomen ) Pain - part of body: Knee     Time: 0950-1030 PT Time Calculation (min) (ACUTE ONLY): 40 min  Charges:  $Gait Training: 8-22 mins $Therapeutic Exercise: 23-37 mins                    G Codes:       Syncere Kaminski B. Migdalia Dk PT, DPT Acute Rehabilitation  [phone] Pager [phone]     [place] 03/22/2017, 2:53 PM

## 2017-03-22 NOTE — Progress Notes (Signed)
Orthopedic Tech Progress Note Patient Details:  [name] 1961-02-21 [id number]  Ortho Devices Type of Ortho Device:  (footsie roll) Ortho Device/Splint Location: on cpm at 2010 Ortho Device/Splint Interventions: Application   Braulio Bosch 03/22/2017, 8:12 PM

## 2017-03-22 NOTE — Progress Notes (Signed)
Made primary RN aware that PICC would be inserted on Friday 5/25.

## 2017-03-22 NOTE — Progress Notes (Signed)
CT reported IV site is not working, IV team made aware but they cant get a line. Ct made aware. Md made aware.

## 2017-03-22 NOTE — Progress Notes (Signed)
Peripherally Inserted Central Catheter/Midline Placement  The IV Nurse has discussed with the patient and/or persons authorized to consent for the patient, the purpose of this procedure and the potential benefits and risks involved with this procedure.  The benefits include less needle sticks, lab draws from the catheter, and the patient may be discharged home with the catheter. Risks include, but not limited to, infection, bleeding, blood clot (thrombus formation), and puncture of an artery; nerve damage and irregular heartbeat and possibility to perform a PICC exchange if needed/ordered by physician.  Alternatives to this procedure were also discussed.  Bard Power PICC patient education guide, fact sheet on infection prevention and patient information card has been provided to patient /or left at bedside.    PICC/Midline Placement Documentation  PICC Triple Lumen 68/08/81 PICC Right Basilic 42 cm (Active)  Indication for Insertion or Continuance of Line Prolonged intravenous therapies 03/22/2017  7:00 PM  Dressing Change Due 03/29/17 03/22/2017  7:00 PM       [place] 03/22/2017, 7:13 PM

## 2017-03-22 NOTE — Progress Notes (Signed)
Pharmacy Antibiotic Note  [name] is a 56 y.o. female admitted on 03/16/2017 with sepsis.  Pharmacy has been consulted for Vancomycin and Zosyn dosing.  Day #5 of broad spectrum abx for sepsis. S/p L TKA 5/14. Possible diverticulitis. Checking CT for possible abscess. Tmax of 100.6 today (trending down), WBC remains elevated at 17.1.  Plan: Continue Zosyn 3.375gm IV Q8H, 4 hr infusion Discussed with surgery, stop vancomycin but low threshold to restart if continues to worsen. May need to check Random level if restarted Monitor clinical picture, renal function, VT prn F/U CT, C&S, abx deescalation / LOT  Height: 5\' 3"  (160 cm) Weight: 231 lb (104.8 kg) IBW/kg (Calculated) : 52.4  Temp (24hrs), Avg:99.4 F (37.4 C), Min:98.6 F (37 C), Max:100.6 F (38.1 C)   Recent Labs Lab 03/16/17 2347 03/17/17 0039 03/17/17 0415 03/17/17 1117 03/18/17 0427 03/19/17 0430 03/19/17 1140 03/21/17 0849 03/22/17 0432  WBC 22.0*  --   --  17.9* 16.9* 15.7*  --  17.1*  --   CREATININE 0.76  --   --  0.67 0.73 0.48  --   --  1.36*  LATICACIDVEN  --  1.68 0.83  --   --   --   --   --   --   VANCOTROUGH  --   --   --   --   --   --  10*  --   --     Estimated Creatinine Clearance: 52.2 mL/min (A) (by C-G formula based on SCr of 1.36 mg/dL (H)).    Allergies  Allergen Reactions  . Iodine-131 Nausea And Vomiting  . Cephalexin Rash  . Codeine Itching and Nausea Only  . Contrast Media [Iodinated Diagnostic Agents] Nausea And Vomiting   Thank you for allowing pharmacy to be a part of this patient's care.  Elenor Quinones, PharmD, BCPS Clinical Pharmacist Pager [phone] 03/22/2017 11:25 AM

## 2017-03-22 NOTE — PMR Pre-admission (Signed)
PMR Admission Coordinator Pre-Admission Assessment  Patient: [name] is an 56 y.o., female MRN: [id number] DOB: 11-Jul-1961 Height: 5' 3"  (160 cm) Weight: 104.8 kg (231 lb)              Insurance Information HMO:     PPO: yes     PCP:      IPA:      80/20:      OTHER:  PRIMARY: [employer]      Policy#: [id number]      Subscriber: pt CM Name: Sherlynn Stalls      Phone#: [phone]     Fax#: EPIC access Pre-Cert#: [id number]    Approved for 7 days  Employer:  Benefits:  Phone #: [phone]     Name: 03/20/2017 Eff. Date: 07/30/2016     Deduct: $2000      Out of Pocket Max: $4000      Life Max: none CIR: 80%      SNF: 80% 60 days Outpatient: $25 co pay per visit     Co-Pay: 20 visits each of PT, OT, and [facility]: 80%      Co-Pay: 60 visits DME: 80%     Co-Pay: 20 % Providers: pt choice  SECONDARY: none       Medicaid Application Date:       Case Manager:  Disability Application Date:       Case Worker:   Emergency Facilities manager Information    Name Relation Home Work [place] Spouse [phone]   No name specified         Current Medical History  Patient Admitting Diagnosis: Left TKR complicated by perforated colon  History of Present Illness:  HPI: [name] is a 56 y.o. female with history of lymphoma, L-TKR 5/14 who was discharged to home but readmitted on 5/19 with LLQ pain due to perforated diverticulitis.  She underwent sigmoid colectomy with diverting colostomy by Dr. Georgette Dover. Post op on IV antibiotics and gradual advancement of diet to soft diet. Stoma pink with soft stool output. WBC elevated 5/24 of 17.1 with low grade temp noted 100.6 and then 15.6 today on 03/23/17. UA and CXR negative. US guided PIV and CT scan on 03/23/17 to r/o abscess. On IV Zosyn. CT revealed postoperative changes with left lower quadrant colostomy. Stranding fluid and gas with in the anterior abdominal wall compatible with recent  postoperative change. Small amount of free fluid in the left paracolic gutter. Bilateral renal parapelvic and cortical cysts. Right nephrolithiasis. Trace bilateral pleural effusions. Pt medically cleared to admit to inpt rehab today.  Past Medical History  Past Medical History:  Diagnosis Date  . Anxiety   . Cancer ([place])    hodgkins lymphoma, 1992 Diagnosed  . Hypertension   . Hypothyroidism    "thyroid is dead"  . Primary localized osteoarthritis of left knee   . S/P total knee replacement using cement, left 03/19/2017    Family History  family history includes Breast cancer in her mother; Diabetes in her father.  Prior Rehab/Hospitalizations:  Has the patient had major surgery during 100 days prior to admission? Yes  Current Medications   Current Facility-Administered Medications:  .  0.9 % NaCl with KCl 20 mEq/ L  infusion, , Intravenous, Continuous, Rayburn, Kelly A, PA-C, Last Rate: 100 mL/hr at 03/23/17 0307 .  acetaminophen (TYLENOL) tablet 650 mg, 650 mg, Oral, Q6H PRN, 650 mg at 03/22/17 0431 **OR** acetaminophen (TYLENOL)  suppository 650 mg, 650 mg, Rectal, Q6H PRN, Donnie Mesa, MD .  alum & mag hydroxide-simeth (MAALOX/MYLANTA) 200-200-20 MG/5ML suspension 30 mL, 30 mL, Oral, Q6H PRN, Ralene Ok, MD .  amLODipine (NORVASC) tablet 10 mg, 10 mg, Oral, QPM, Donnie Mesa, MD, 10 mg at 03/22/17 1759 .  benazepril (LOTENSIN) tablet 20 mg, 20 mg, Oral, Daily, Donnie Mesa, MD, 20 mg at 03/23/17 1000 .  calcium carbonate (TUMS - dosed in mg elemental calcium) chewable tablet 800 mg of elemental calcium, 800 mg of elemental calcium, Oral, TID, Rayburn, Kelly A, PA-C .  diphenhydrAMINE (BENADRYL) injection 12.5 mg, 12.5 mg, Intravenous, Q6H PRN **OR** diphenhydrAMINE (BENADRYL) 12.5 MG/5ML elixir 12.5 mg, 12.5 mg, Oral, Q6H PRN, Donnie Mesa, MD .  docusate sodium (COLACE) capsule 100 mg, 100 mg, Oral, BID, Greer Pickerel, MD, 100 mg at 03/23/17 1000 .  enoxaparin  (LOVENOX) injection 50 mg, 50 mg, Subcutaneous, Q24H, Reginia Naas, RPH, 50 mg at 03/23/17 0805 .  hydrALAZINE (APRESOLINE) injection 10 mg, 10 mg, Intravenous, Q2H PRN, Donnie Mesa, MD .  HYDROmorphone (DILAUDID) injection 1 mg, 1 mg, Intravenous, Q2H PRN, Rayburn, Kelly A, PA-C .  iopamidol (ISOVUE-300) 61 % injection, , , ,  .  levothyroxine (SYNTHROID, LEVOTHROID) tablet 88 mcg, 88 mcg, Oral, QAC breakfast, Donnie Mesa, MD, 88 mcg at 03/23/17 0806 .  loratadine (CLARITIN) tablet 10 mg, 10 mg, Oral, Daily, Donnie Mesa, MD, 10 mg at 03/23/17 1000 .  methocarbamol (ROBAXIN) tablet 500 mg, 500 mg, Oral, Q6H PRN, Donnie Mesa, MD, 500 mg at 03/21/17 2139 .  naloxone [facility]) injection 0.4 mg, 0.4 mg, Intravenous, PRN **AND** sodium chloride flush (NS) 0.9 % injection 9 mL, 9 mL, Intravenous, PRN, Donnie Mesa, MD .  ondansetron (ZOFRAN-ODT) disintegrating tablet 4 mg, 4 mg, Oral, Q6H PRN **OR** ondansetron (ZOFRAN) injection 4 mg, 4 mg, Intravenous, Q6H PRN, Donnie Mesa, MD .  oxyCODONE (Oxy IR/ROXICODONE) immediate release tablet 5-10 mg, 5-10 mg, Oral, Q4H PRN, Rayburn, Kelly A, PA-C, 10 mg at 03/21/17 2140 .  piperacillin-tazobactam (ZOSYN) IVPB 3.375 g, 3.375 g, Intravenous, Q8H, Franky Macho, RPH, Stopped at 03/23/17 1000 .  pravastatin (PRAVACHOL) tablet 20 mg, 20 mg, Oral, QPM, Donnie Mesa, MD, 20 mg at 03/22/17 1759 .  sodium chloride flush (NS) 0.9 % injection 10-40 mL, 10-40 mL, Intracatheter, Q12H, Bensimhon, Daniel R, MD .  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 None of the above  Prior Functional Level Prior Function Level of Independence: Independent  Self Care: Did the patient need help bathing, dressing, using the toilet or eating?  Independent  Indoor Mobility: Did the patient need assistance with walking from room to room (with or without device)? Independent  Stairs: Did the patient need assistance with internal or external stairs (with or without device)? Independent  Functional Cognition: Did the patient need help planning regular tasks such as shopping or remembering to take medications? Independent  Current Functional Level Cognition  Overall Cognitive Status:  Within Functional Limits for tasks assessed Orientation Level: Oriented X4    Extremity Assessment (includes Sensation/Coordination)  Upper Extremity Assessment: Generalized weakness  Lower Extremity Assessment: Defer to PT evaluation LLE Deficits / Details: Grossly decr AROM and strength; range approx 5-65 deg LLE: Unable to fully assess due to pain    ADLs  Overall ADL's : Needs assistance/impaired Eating/Feeding: Modified independent, Bed level Grooming: Wash/dry hands, Wash/dry face, Set up, Bed level Upper Body Bathing: Maximal assistance, Bed level Lower Body Bathing: Total assistance, Bed level Lower Body Bathing Details (indicate cue type and reason): daughter (A) on arrival Toilet Transfer: Minimal assistance, +2 for physical assistance Toilet Transfer Details (indicate cue type and reason): needs (A) to power up and max cues for hand placement Toileting - Clothing Manipulation Details (indicate cue type and reason): unable to void bladder but reports the urge Functional mobility during ADLs: Moderate assistance, Rolling walker General ADL Comments: Pt able to power up better today and more alert. Pt with PCA button  d/c this session and much more alert    Mobility  Overal bed mobility: Needs Assistance Bed Mobility: Supine to Sit Rolling: Max assist Sidelying to sit: Mod assist Supine to sit: Max assist General bed mobility comments: Up in recliner at entry.     Transfers  Overall transfer level: Needs assistance Equipment used: Rolling walker (2 wheeled) Transfers: Sit to/from Stand Sit to Stand: +2 physical assistance, Min assist Stand pivot transfers: +2 physical assistance, Min assist General transfer comment: minAx2 for powerup and steadying in RW    Ambulation / Gait / Stairs / Wheelchair Mobility  Ambulation/Gait Ambulation/Gait assistance: Min assist, +2 safety/equipment (Chair to follow) Ambulation Distance (Feet): 50 Feet Assistive device: Rolling walker (2 wheeled) Gait Pattern/deviations: Step-through pattern, Trunk flexed, Decreased step length - right, Decreased step length - left (decreased knee flexion on L) General Gait Details: vc for upright posture, increased heel strike and leg extension at toe off. Gait velocity: slowed Gait velocity interpretation: Below normal speed for age/gender    Posture / Balance Balance Overall balance assessment: Needs assistance Sitting-balance support: No upper extremity supported, Feet supported Sitting balance-Leahy Scale: Good Standing balance support: During functional activity, No upper extremity supported Standing balance-Leahy Scale: Poor (reliant on RW for support) Standing balance comment: requires RW for support    Special needs/care consideration BiPAP/CPAP  N/a CPM  Yes; Left CPM 6 hrs per day; 2 hrs in am, 2 hrs in afternoon and 2 hrs in evening; 0- 90 degrees Continuous Drip IV  PICC placed 03/22/2017 Dialysis  N/a Life Vest  N/a Oxygen  N/a Special Bed   N/a Trach Size  N/a Wound Vac (area) yes; see below for WOC assessments. Monday, Wednesday and Friday changes Skin colostomy; left knee surgical incision, abdominal  wound with wound VAC Melody R, RN Registered Nurse Signed WOC  Consult Note Date of Service: 03/20/2017 1:09 PM      [] Hide copied text [] Hover for attribution information [place] Nurse ostomy consult note Stoma type/location: LLQ, end colostomy Stomal assessment/size: oval shaped, the pouch is intact today Peristomal assessment: NA Treatment options for stomal/peristomal skin: using barrier ring and convexity to aid in seal with abdominal contour issues. Output serous, ostomy sweat, +flatus Ostomy pouching: 1pc.flexible convex  Education provided: met with patient and her daughter today, pouch intact from yesterday.  Answered questions about supplies, will mark [employer] ostomy catalog for them once we see if the flexible convex is going to work for her.  Enrolled  patient in [place] program: No, permission card signed holding off until we evaluate the effectiveness of the current pouching situation.  Will plan to change pouch again in the am with NPWT VAC dressing change.   Melody [facility], CNS [phone]      Nadara Mode, RN Registered Nurse Signed WOC  Consult Note Date of Service: 03/19/2017 10:20 AM      [] Hide copied text [] Hover for attribution information [place] Nurse wound consult note Reason for Consult: NPWT dressing change, first post op dressing Wound type: surgical  Pressure Injury POA: No Measurement:21cm x 4.5cm x 3.5cm  Wound POE:UMPNT, subcutaneous tissue, moist Drainage (amount, consistency, odor) minimal in canister, some pooling of serosanginous drainage in the base of the distal edge of the wound bed. Periwound: intact Dressing procedure/placement/frequency: 1pc of black foam used to fill the wound bed, sealed at 125mHG.  Patient tolerated well, used PCA pump 1x during dressing change.  WWintersetNurse ostomy consult note Stoma type/location: LLQ, end colostomy Stomal assessment/size: 1" x 1 3/4" oval shaped, in an abdominal  skin fold, slightly budded Peristomal assessment: intact, one area of MARSI (medical adhesive skin injury noted at 2 oclock at the distal edge of the tape border of the ostomy wafer. Blister intact  Treatment options for stomal/peristomal skin: liquid skin barrier used on blistered area Output none Ostomy pouching: 1pc convex flexible used today, 2" barrier ring cut in 1/2 and each half used around the stoma at 3 and 9 oclock to flatted dip in abdominal contour.  Education provided: Husband engaged to learn, provided wGaffer He observed pouch change today.  Patient is quite sleepy from PCA pump use from dressing change and from dose she self administered during pouch change.   [place], [place] signed, will wait until I evaluate use of flex convex vs. Use of flat convex before I send samples.  WNorfolkNurse will follow along with you for continued support with ostomy teaching and care and NPWT dressing changes, due to the proximity of the dressing and the ostomy wafer.  Melody ALaw[facility] HospitalMSN, RN, [place] [phone]            Bowel mgmt:  Continent LBM * Bladder mgmt:  continent Diabetic mgmt  N/a Colostomy new this admission   Previous Home Environment Living Arrangements: Spouse/significant other  Lives With: Spouse Available Help at Discharge:  (spouse may take FMLA for when pt returns home.) Type of Home: House Home Layout: One level Home Access: Stairs to enter Entrance Stairs-Rails: None Entrance Stairs-Number of Steps: 3 Bathroom Shower/Tub: TPublic librarian CAir cabin crewAccessibility: Yes How Accessible: Accessible via walker Additional Comments: Homehealth prior to admission for L TKA  [facility] ordered after TKR  Discharge Living Setting Plans for Discharge Living Setting: Patient's home, Lives with (comment) (spouse) Type of Home at Discharge:  House Discharge Home Layout: One level Discharge Home Access: Stairs to enter Entrance Stairs-Rails: None Entrance Stairs-Number of Steps: 3 Discharge Bathroom Shower/Tub: Tub/shower unit, Curtain Discharge Bathroom Toilet: Standard Discharge Bathroom Accessibility: Yes How Accessible: Accessible via walker Does the patient have any problems obtaining your medications?: No  Social/Family/Support Systems Patient Roles: Spouse, Parent, Other (Comment) (employee) Contact Information: [place] spouse Anticipated Caregiver: spouse, daughter and sister Anticipated Caregiver's Contact Information: see above Ability/Limitations of Caregiver: spouse and daughter work; spouse may take some FMLA Caregiver Availability: Other (Comment) Discharge Plan Discussed with Primary  Caregiver: Yes Is Caregiver In Agreement with Plan?: Yes Does Caregiver/Family have Issues with Lodging/Transportation while Pt is in Rehab?: No  Goals/Additional Needs Patient/Family Goal for Rehab: Mod I to supervision with PT and OT Expected length of stay: ELOS 8-13 days Equipment Needs: Wond VAC; new colostomy this admission Pt/Family Agrees to Admission and willing to participate: Yes Program Orientation Provided & Reviewed with Pt/Caregiver Including Roles  & Responsibilities: Yes  Decrease burden of Care through IP rehab admission: n/a  Possible need for SNF placement upon discharge: not anticipated  Patient Condition: This patient's medical and functional status has changed since the consult dated: 03/19/2017 in which the Rehabilitation Physician determined and documented that the patient's condition is appropriate for intensive rehabilitative care in an inpatient rehabilitation facility. See "History of Present Illness" (above) for medical update. Functional changes are: overall min to mod assist. Patient's medical and functional status update has been discussed with the Rehabilitation physician and patient remains  appropriate for inpatient rehabilitation. Will admit to inpatient rehab today.  Preadmission Screen Completed By:  Cleatrice Burke, 03/23/2017 12:53 PM ______________________________________________________________________   Discussed status with Dr. Naaman Plummer on 03/23/2017 at 1252 and received telephone approval for admission today.  Admission Coordinator:  Cleatrice Burke, time 5053 Date 03/23/2017

## 2017-03-22 NOTE — Progress Notes (Signed)
Per IV team , no PICC team tonight. MD made aware , awaiting response.

## 2017-03-22 NOTE — Progress Notes (Signed)
[facility] Progress Note  5 Days Post-Op  Subjective: CC: No complaints Patient feeling well. Tolerating soft diet, +stool output from ostomy bag. Denies N/V. Pain well controlled. Denies Chest pain, SOB, dysuria.  Temp 100.6 early this AM.  UOP good.  Objective: Vital signs in last 24 hours: Temp:  [98.6 F (37 C)-100.6 F (38.1 C)] 100.6 F (38.1 C) (05/24 0418) Pulse Rate:  [101-110] 103 (05/24 0418) Resp:  [18-19] 19 (05/24 0418) BP: (120-124)/(52-60) 120/60 (05/24 0418) SpO2:  [89 %-93 %] 93 % (05/24 0427) Last BM Date: 03/14/17  Intake/Output from previous day: 05/23 0701 - 05/24 0700 In: 2257.5 [P.O.:480; I.V.:1127.5; IV Piggyback:650] Out: 2525 [Urine:2525] Intake/Output this shift: No intake/output data recorded.  PE: Gen:  Alert, NAD, pleasant Card:  Regular rate and rhythm Pulm:  Normal effort, clear to auscultation bilaterally Abd: Soft, non-tender, non-distended, bowel sounds present in all 4 quadrants. VAC present over midline wound. Stoma pink with some soft stool present.  Skin: warm and dry, no rashes.  Psych: A&Ox3   Lab Results:   Recent Labs  03/21/17 0849  WBC 17.1*  HGB 8.6*  HCT 26.7*  PLT 260   BMET  Recent Labs  03/22/17 0432  NA 142  K 3.8  CL 107  CO2 28  GLUCOSE 109*  BUN 11  CREATININE 1.36*  CALCIUM 7.9*   PT/INR No results for input(s): LABPROT, INR in the last 72 hours. CMP     Component Value Date/Time   NA 142 03/22/2017 0432   K 3.8 03/22/2017 0432   CL 107 03/22/2017 0432   CO2 28 03/22/2017 0432   GLUCOSE 109 (H) 03/22/2017 0432   BUN 11 03/22/2017 0432   CREATININE 1.36 (H) 03/22/2017 0432   CALCIUM 7.9 (L) 03/22/2017 0432   PROT 7.2 03/16/2017 2347   ALBUMIN 3.3 (L) 03/16/2017 2347   AST 39 03/16/2017 2347   ALT 44 03/16/2017 2347   ALKPHOS 52 03/16/2017 2347   BILITOT 0.9 03/16/2017 2347   GFRNONAA 42 (L) 03/22/2017 0432   GFRAA 49 (L) 03/22/2017 0432   Lipase     Component Value  Date/Time   LIPASE 16 03/16/2017 2347     Anti-infectives: Anti-infectives    Start     Dose/Rate Route Frequency Ordered Stop   03/19/17 2000  vancomycin (VANCOCIN) 1,250 mg in sodium chloride 0.9 % 250 mL IVPB     1,250 mg 166.7 mL/hr over 90 Minutes Intravenous Every 12 hours 03/19/17 1326     03/17/17 1200  vancomycin (VANCOCIN) IVPB 750 mg/150 ml premix  Status:  Discontinued     750 mg 150 mL/hr over 60 Minutes Intravenous Every 12 hours 03/17/17 0347 03/19/17 1326   03/17/17 1000  piperacillin-tazobactam (ZOSYN) IVPB 3.375 g     3.375 g 12.5 mL/hr over 240 Minutes Intravenous Every 8 hours 03/17/17 0347     03/17/17 0100  vancomycin (VANCOCIN) 2,000 mg in sodium chloride 0.9 % 500 mL IVPB     2,000 mg 250 mL/hr over 120 Minutes Intravenous  Once 03/17/17 0052 03/17/17 0423   03/17/17 0015  piperacillin-tazobactam (ZOSYN) IVPB 3.375 g     3.375 g 100 mL/hr over 30 Minutes Intravenous  Once 03/17/17 0004 03/17/17 0242   03/17/17 0015  vancomycin (VANCOCIN) IVPB 1000 mg/200 mL premix  Status:  Discontinued     1,000 mg 200 mL/hr over 60 Minutes Intravenous  Once 03/17/17 0004 03/17/17 0220       Assessment/Plan S/P Sigmoid  colon resection and creation of colostomy - POD#5 - Hgb 8.6 from 9.3 5/21 - continue SOFT diet - stoma pink with soft stool output - mobilize, IS  Leukocytosis - WBC 17.1 today from 15.7 5/21 - febrile early this morning - UA, CXR ordered - CBC in AM  AKI - creatinine elevated 1.36 from 0.48 5/21 - GFR decreased - UOP good - vanc stopped by pharmacy - increased IVF rate, will recheck in AM  S/P left knee arthroplasty 5/14 Dr Noemi Chapel - ortho seeing - CPM, therapies  FEN- soft diet, IVF, AM labs VTE- lovenox, SCDs ID- Zosyn (5/19>) and Vanc (5/19> 5/24 stopped by pharmacy)  LOS: 5 days    Brigid Re , [facility] 03/22/2017, 7:51 AM Pager: [phone] Consults: [phone] Mon-Fri 7:00 am-4:30 pm Sat-Sun  7:00 am-11:30 am

## 2017-03-23 ENCOUNTER — Inpatient Hospital Stay (HOSPITAL_COMMUNITY)
Admission: RE | Admit: 2017-03-23 | Discharge: 2017-03-30 | DRG: 560 | Disposition: A | Discharge status: Home Health | Payer: 59 | Source: Intra-hospital | Attending: Physical Medicine & Rehabilitation | Admitting: Physical Medicine & Rehabilitation

## 2017-03-23 ENCOUNTER — Inpatient Hospital Stay (HOSPITAL_COMMUNITY): Payer: 59

## 2017-03-23 DIAGNOSIS — Z933 Colostomy status: Secondary | ICD-10-CM

## 2017-03-23 DIAGNOSIS — R509 Fever, unspecified: Secondary | ICD-10-CM

## 2017-03-23 DIAGNOSIS — J189 Pneumonia, unspecified organism: Secondary | ICD-10-CM

## 2017-03-23 DIAGNOSIS — R5381 Other malaise: Secondary | ICD-10-CM | POA: Diagnosis not present

## 2017-03-23 DIAGNOSIS — Z91041 Radiographic dye allergy status: Secondary | ICD-10-CM

## 2017-03-23 DIAGNOSIS — K5720 Diverticulitis of large intestine with perforation and abscess without bleeding: Secondary | ICD-10-CM | POA: Diagnosis not present

## 2017-03-23 DIAGNOSIS — Z79899 Other long term (current) drug therapy: Secondary | ICD-10-CM | POA: Diagnosis not present

## 2017-03-23 DIAGNOSIS — D72829 Elevated white blood cell count, unspecified: Secondary | ICD-10-CM | POA: Diagnosis present

## 2017-03-23 DIAGNOSIS — Z87891 Personal history of nicotine dependence: Secondary | ICD-10-CM

## 2017-03-23 DIAGNOSIS — Z7982 Long term (current) use of aspirin: Secondary | ICD-10-CM

## 2017-03-23 DIAGNOSIS — Z471 Aftercare following joint replacement surgery: Secondary | ICD-10-CM | POA: Diagnosis not present

## 2017-03-23 DIAGNOSIS — E039 Hypothyroidism, unspecified: Secondary | ICD-10-CM | POA: Diagnosis present

## 2017-03-23 DIAGNOSIS — K631 Perforation of intestine (nontraumatic): Secondary | ICD-10-CM | POA: Insufficient documentation

## 2017-03-23 DIAGNOSIS — I10 Essential (primary) hypertension: Secondary | ICD-10-CM

## 2017-03-23 DIAGNOSIS — Z881 Allergy status to other antibiotic agents status: Secondary | ICD-10-CM | POA: Diagnosis not present

## 2017-03-23 DIAGNOSIS — J9811 Atelectasis: Secondary | ICD-10-CM | POA: Diagnosis not present

## 2017-03-23 DIAGNOSIS — Z96652 Presence of left artificial knee joint: Secondary | ICD-10-CM | POA: Diagnosis not present

## 2017-03-23 DIAGNOSIS — K5780 Diverticulitis of intestine, part unspecified, with perforation and abscess without bleeding: Secondary | ICD-10-CM | POA: Diagnosis not present

## 2017-03-23 DIAGNOSIS — Z885 Allergy status to narcotic agent status: Secondary | ICD-10-CM | POA: Diagnosis not present

## 2017-03-23 DIAGNOSIS — R5081 Fever presenting with conditions classified elsewhere: Secondary | ICD-10-CM | POA: Diagnosis not present

## 2017-03-23 DIAGNOSIS — N200 Calculus of kidney: Secondary | ICD-10-CM | POA: Diagnosis not present

## 2017-03-23 DIAGNOSIS — D62 Acute posthemorrhagic anemia: Secondary | ICD-10-CM | POA: Diagnosis not present

## 2017-03-23 DIAGNOSIS — E876 Hypokalemia: Secondary | ICD-10-CM | POA: Diagnosis not present

## 2017-03-23 DIAGNOSIS — M1712 Unilateral primary osteoarthritis, left knee: Secondary | ICD-10-CM | POA: Diagnosis present

## 2017-03-23 LAB — CBC
HCT: 26.7 % — ABNORMAL LOW (ref 36.0–46.0)
HEMOGLOBIN: 8.7 g/dL — AB (ref 12.0–15.0)
MCH: 32 pg (ref 26.0–34.0)
MCHC: 32.6 g/dL (ref 30.0–36.0)
MCV: 98.2 fL (ref 78.0–100.0)
PLATELETS: 308 10*3/uL (ref 150–400)
RBC: 2.72 MIL/uL — ABNORMAL LOW (ref 3.87–5.11)
RDW: 14.8 % (ref 11.5–15.5)
WBC: 15.6 10*3/uL — ABNORMAL HIGH (ref 4.0–10.5)

## 2017-03-23 LAB — BASIC METABOLIC PANEL
ANION GAP: 8 (ref 5–15)
BUN: 9 mg/dL (ref 6–20)
CALCIUM: 8 mg/dL — AB (ref 8.9–10.3)
CO2: 30 mmol/L (ref 22–32)
CREATININE: 1.13 mg/dL — AB (ref 0.44–1.00)
Chloride: 105 mmol/L (ref 101–111)
GFR calc Af Amer: >60 mL/min (ref >60)
GFR, EST NON AFRICAN AMERICAN: 53 mL/min — AB (ref >60)
GLUCOSE: 102 mg/dL — AB (ref 65–99)
Potassium: 4 mmol/L (ref 3.5–5.1)
Sodium: 143 mmol/L (ref 135–145)

## 2017-03-23 LAB — URINALYSIS, ROUTINE W REFLEX MICROSCOPIC
Bacteria, UA: NONE SEEN
Bilirubin Urine: NEGATIVE
GLUCOSE, UA: NEGATIVE mg/dL
Ketones, ur: NEGATIVE mg/dL
Leukocytes, UA: NEGATIVE
Nitrite: NEGATIVE
PH: 7 (ref 5.0–8.0)
PROTEIN: NEGATIVE mg/dL
Specific Gravity, Urine: 1.019 (ref 1.005–1.030)

## 2017-03-23 MED ORDER — ENOXAPARIN SODIUM 60 MG/0.6ML ~~LOC~~ SOLN
50.0000 mg | SUBCUTANEOUS | Status: DC
  Administered 2017-03-24 – 2017-03-30 (×7): 50 mg via SUBCUTANEOUS
  Filled 2017-03-23 (×7): qty 0.6

## 2017-03-23 MED ORDER — AMLODIPINE BESYLATE 10 MG PO TABS
10.0000 mg | ORAL_TABLET | Freq: Every evening | ORAL | Status: DC
  Administered 2017-03-23 – 2017-03-29 (×7): 10 mg via ORAL
  Filled 2017-03-23 (×7): qty 1

## 2017-03-23 MED ORDER — CALCIUM CARBONATE ANTACID 500 MG PO CHEW
800.0000 mg | CHEWABLE_TABLET | Freq: Three times a day (TID) | ORAL | Status: DC
  Filled 2017-03-23: qty 4

## 2017-03-23 MED ORDER — LORATADINE 10 MG PO TABS: 10.0000 mg | ORAL_TABLET | Freq: Every day | ORAL | Status: DC

## 2017-03-23 MED ORDER — PROCHLORPERAZINE 25 MG RE SUPP: 12.5000 mg | Freq: Four times a day (QID) | RECTAL | Status: DC | PRN

## 2017-03-23 MED ORDER — PROCHLORPERAZINE MALEATE 5 MG PO TABS: 5.0000 mg | ORAL_TABLET | Freq: Four times a day (QID) | ORAL | Status: DC | PRN

## 2017-03-23 MED ORDER — FLEET ENEMA 7-19 GM/118ML RE ENEM: 1.0000 | ENEMA | Freq: Once | RECTAL | Status: DC | PRN

## 2017-03-23 MED ORDER — PROCHLORPERAZINE EDISYLATE 5 MG/ML IJ SOLN: 5.0000 mg | Freq: Four times a day (QID) | INTRAMUSCULAR | Status: DC | PRN

## 2017-03-23 MED ORDER — LORATADINE 10 MG PO TABS
10.0000 mg | ORAL_TABLET | Freq: Every day | ORAL | Status: DC
  Administered 2017-03-24 – 2017-03-30 (×7): 10 mg via ORAL
  Filled 2017-03-23 (×7): qty 1

## 2017-03-23 MED ORDER — ALUM & MAG HYDROXIDE-SIMETH 200-200-20 MG/5ML PO SUSP: 30.0000 mL | ORAL | Status: DC | PRN

## 2017-03-23 MED ORDER — OXYCODONE HCL 5 MG PO TABS
5.0000 mg | ORAL_TABLET | ORAL | Status: DC | PRN
  Administered 2017-03-25: 10 mg via ORAL
  Filled 2017-03-23: qty 2

## 2017-03-23 MED ORDER — ONDANSETRON HCL 4 MG/2ML IJ SOLN: 4.0000 mg | Freq: Four times a day (QID) | INTRAMUSCULAR | Status: DC | PRN

## 2017-03-23 MED ORDER — TRAZODONE HCL 50 MG PO TABS
25.0000 mg | ORAL_TABLET | Freq: Every evening | ORAL | Status: DC | PRN
  Administered 2017-03-23 – 2017-03-29 (×4): 50 mg via ORAL
  Filled 2017-03-23 (×4): qty 1

## 2017-03-23 MED ORDER — METHOCARBAMOL 500 MG PO TABS: 500.0000 mg | ORAL_TABLET | Freq: Four times a day (QID) | ORAL | Status: DC | PRN

## 2017-03-23 MED ORDER — POLYSACCHARIDE IRON COMPLEX 150 MG PO CAPS
150.0000 mg | ORAL_CAPSULE | Freq: Two times a day (BID) | ORAL | Status: DC
  Administered 2017-03-23 – 2017-03-29 (×13): 150 mg via ORAL
  Filled 2017-03-23 (×13): qty 1

## 2017-03-23 MED ORDER — LEVOTHYROXINE SODIUM 88 MCG PO TABS
88.0000 ug | ORAL_TABLET | Freq: Every day | ORAL | Status: DC
  Administered 2017-03-24 – 2017-03-30 (×7): 88 ug via ORAL
  Filled 2017-03-23 (×8): qty 1

## 2017-03-23 MED ORDER — PROSIGHT PO TABS
1.0000 | ORAL_TABLET | Freq: Every day | ORAL | Status: DC
  Administered 2017-03-23 – 2017-03-30 (×8): 1 via ORAL
  Filled 2017-03-23 (×8): qty 1

## 2017-03-23 MED ORDER — GUAIFENESIN-DM 100-10 MG/5ML PO SYRP: 5.0000 mL | ORAL_SOLUTION | Freq: Four times a day (QID) | ORAL | Status: DC | PRN

## 2017-03-23 MED ORDER — POLYETHYLENE GLYCOL 3350 17 G PO PACK: 17.0000 g | PACK | Freq: Every day | ORAL | Status: DC | PRN

## 2017-03-23 MED ORDER — PRAVASTATIN SODIUM 20 MG PO TABS
20.0000 mg | ORAL_TABLET | Freq: Every evening | ORAL | Status: DC
  Administered 2017-03-23 – 2017-03-29 (×7): 20 mg via ORAL
  Filled 2017-03-23 (×7): qty 1

## 2017-03-23 MED ORDER — BENAZEPRIL HCL 20 MG PO TABS: 20.0000 mg | ORAL_TABLET | Freq: Every day | ORAL | Status: DC

## 2017-03-23 MED ORDER — AMOXICILLIN-POT CLAVULANATE 875-125 MG PO TABS
1.0000 | ORAL_TABLET | Freq: Two times a day (BID) | ORAL | Status: DC
  Administered 2017-03-23: 1 via ORAL
  Filled 2017-03-23: qty 1

## 2017-03-23 MED ORDER — DIPHENHYDRAMINE HCL 12.5 MG/5ML PO ELIX: 12.5000 mg | ORAL_SOLUTION | Freq: Four times a day (QID) | ORAL | Status: DC | PRN

## 2017-03-23 MED ORDER — BENAZEPRIL HCL 20 MG PO TABS
20.0000 mg | ORAL_TABLET | Freq: Every day | ORAL | Status: DC
  Administered 2017-03-24 – 2017-03-30 (×7): 20 mg via ORAL
  Filled 2017-03-23 (×8): qty 1

## 2017-03-23 MED ORDER — PRO-STAT SUGAR FREE PO LIQD
30.0000 mL | Freq: Two times a day (BID) | ORAL | Status: DC
  Administered 2017-03-23 – 2017-03-30 (×14): 30 mL via ORAL
  Filled 2017-03-23 (×13): qty 30

## 2017-03-23 MED ORDER — ONDANSETRON 4 MG PO TBDP
4.0000 mg | ORAL_TABLET | Freq: Four times a day (QID) | ORAL | Status: DC | PRN
  Filled 2017-03-23: qty 1

## 2017-03-23 MED ORDER — TRAMADOL HCL 50 MG PO TABS: 50.0000 mg | ORAL_TABLET | Freq: Four times a day (QID) | ORAL | Status: DC | PRN

## 2017-03-23 MED ORDER — ACETAMINOPHEN 325 MG PO TABS
325.0000 mg | ORAL_TABLET | ORAL | Status: DC | PRN
  Administered 2017-03-24 – 2017-03-29 (×6): 650 mg via ORAL
  Filled 2017-03-23 (×7): qty 2

## 2017-03-23 MED ORDER — LEVOTHYROXINE SODIUM 88 MCG PO TABS: 88.0000 ug | ORAL_TABLET | Freq: Every day | ORAL | Status: DC

## 2017-03-23 MED ORDER — CALCIUM CARBONATE ANTACID 500 MG PO CHEW
800.0000 mg | CHEWABLE_TABLET | Freq: Three times a day (TID) | ORAL | Status: DC
  Administered 2017-03-23 – 2017-03-29 (×7): 800 mg via ORAL
  Filled 2017-03-23 (×14): qty 4

## 2017-03-23 MED ORDER — IOPAMIDOL (ISOVUE-300) INJECTION 61%
INTRAVENOUS | Status: AC
  Filled 2017-03-23: qty 100

## 2017-03-23 MED ORDER — AMOXICILLIN-POT CLAVULANATE 875-125 MG PO TABS
1.0000 | ORAL_TABLET | Freq: Two times a day (BID) | ORAL | Status: DC
  Administered 2017-03-23 – 2017-03-30 (×14): 1 via ORAL
  Filled 2017-03-23 (×15): qty 1

## 2017-03-23 MED ORDER — BISACODYL 10 MG RE SUPP: 10.0000 mg | Freq: Every day | RECTAL | Status: DC | PRN

## 2017-03-23 NOTE — Progress Notes (Signed)
I have approval from [place] to admit pt to inpt rehab today. I will make the arrangements. RN CM made aware. [phone]

## 2017-03-23 NOTE — H&P (Signed)
Physical Medicine and Rehabilitation Admission H&P    Chief Complaint  Patient presents with  . Abdominal Pain    HPI: [name] is a 56 y.o. female with history of lymphoma, L-TKR 5/14 who was discharged to home but readmitted on 5/19 with LLQ pain due to perforated diverticulitis. History taken from chart review and husband. She underwent sigmoid colectomy with diverting colostomy by Dr. Georgette Dover. Post op on IV antibiotics and diet has slowly been advanced to soft. Wound VAC in place and being changed MWF by [facility]. She has had persistent leucocytosis and spiked temp 101 on 5/22 therefore started on Vanc/Zosyn. She has defervesced and antibiotic narrowed to Augmentin X 10 days today.   She is currently limited by pain with inability with deficits in mobility. CIR recommended for follow up therapy.    Review of Systems  Constitutional: Negative for diaphoresis and malaise/fatigue.  HENT: Negative for hearing loss and tinnitus.   Eyes: Negative for blurred vision and double vision.  Respiratory: Negative for cough.   Cardiovascular: Negative for chest pain and palpitations.  Gastrointestinal: Positive for abdominal pain. Negative for constipation, heartburn and nausea.  Genitourinary: Negative for dysuria and urgency.  Musculoskeletal: Positive for back pain. Negative for myalgias.  Skin: Negative for itching and rash.  Neurological: Negative for dizziness and speech change.  Psychiatric/Behavioral: Positive for depression. The patient is not nervous/anxious and does not have insomnia.      Past Medical History:  Diagnosis Date  . Anxiety   . Cancer ([place])    hodgkins lymphoma, 1992 Diagnosed  . Hypertension   . Hypothyroidism    "thyroid is dead"  . Primary localized osteoarthritis of left knee   . S/P total knee replacement using cement, left 03/19/2017    Past Surgical History:  Procedure Laterality Date  . AXILLARY LYMPH NODE DISSECTION Right 1992  . BONE MARROW  TRANSPLANT    . COLON RESECTION SIGMOID N/A 03/17/2017   Procedure: COLON RESECTION SIGMOID AND CREATION OF COLOSTOMY;  Surgeon: Donnie Mesa, MD;  Location: [facility];  Service: General;  Laterality: N/A;  . COLONOSCOPY N/A 09/07/2015   Procedure: COLONOSCOPY;  Surgeon: Aviva Signs, MD;  [facility];  Service: Gastroenterology;  Laterality: N/A;  . ECTOPIC PREGNANCY SURGERY  1990  . TONSILLECTOMY  1965  . TOTAL KNEE ARTHROPLASTY Left 03/12/2017   Procedure: LEFT TOTAL KNEE ARTHROPLASTY;  Surgeon: Elsie Saas, MD;  Location: [facility];  Service: Orthopedics;  Laterality: Left;    Family History  Problem Relation Age of Onset  . Breast cancer Mother   . Diabetes Father     Social History:   Married. Independent without AD and was working prior to knee surgery. 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  She reports that she quit smoking about 26 years ago. She has never used smokeless tobacco. She reports that she does not drink alcohol or use drugs.     Allergies  Allergen Reactions  . Iodine-131 Nausea And Vomiting  . Cephalexin Rash  . Codeine Itching and Nausea Only  . Contrast Media [Iodinated Diagnostic Agents] Nausea And Vomiting    Medications Prior to Admission  Medication Sig Dispense Refill  . acetaminophen (TYLENOL) 325 MG tablet Take 2 tablets (650 mg total) by mouth every 6 (six) hours as needed for mild pain (or Fever >/= 101).    Marland Kitchen amLODipine (NORVASC) 10 MG tablet Take 10 mg by mouth every evening.     Marland Kitchen  aspirin EC 325 MG EC tablet 1 tab a day for the next 30 days to prevent blood clots (Patient taking differently: Take 325 mg by mouth daily. for the next 30 days to prevent blood clots) 30 tablet 0  . benazepril (LOTENSIN) 20 MG tablet Take 20 mg by mouth daily.    . diphenhydrAMINE (BENADRYL) 25 mg capsule Take 25 mg by mouth at bedtime.    . docusate sodium (COLACE) 100 MG capsule 1 tab 2 times a day while on narcotics.  STOOL SOFTENER (Patient taking  differently: Take 100 mg by mouth 2 (two) times daily. while on narcotics.) 60 capsule 0  . HYDROmorphone (DILAUDID) 2 MG tablet 1-2 tablets every 4 hrs as needed for pain (Patient taking differently: Take 1-2 mg by mouth every 4 (four) hours as needed for severe pain. ) 84 tablet 0  . levothyroxine (SYNTHROID, LEVOTHROID) 88 MCG tablet Take 88 mcg by mouth daily before breakfast.    . loratadine (CLARITIN) 10 MG tablet Take 10 mg by mouth daily.    . polyethylene glycol (MIRALAX / GLYCOLAX) packet 17grams in 16 oz of water twice a day until bowel movement.  LAXITIVE.  Restart if two days since last bowel movement 14 each 0  . pravastatin (PRAVACHOL) 20 MG tablet Take 20 mg by mouth every evening.     . Probiotic Product (PHILLIPS COLON HEALTH PO) Take 1 tablet by mouth daily.    . TURMERIC PO Take 1 tablet by mouth daily.      Home: Home Living Family/patient expects to be discharged to:: Private residence Living Arrangements: Spouse/significant other Available Help at Discharge:  (spouse may take FMLA for when pt returns home.) Type of Home: House Home Access: Stairs to enter [employer] of Steps: 3 Entrance Stairs-Rails: None Home Layout: One level Bathroom Shower/Tub: Tub/shower unit, Architectural technologist: Standard Bathroom Accessibility: Yes Home Equipment: Environmental consultant - 2 wheels, Bedside commode Additional Comments: Homehealth prior to admission for L TKA  Lives With: Spouse   Functional History: Prior Function Level of Independence: Independent  Functional Status:  Mobility: Bed Mobility Overal bed mobility: Needs Assistance Bed Mobility: Supine to Sit Rolling: Max assist Sidelying to sit: Mod assist Supine to sit: Max assist General bed mobility comments: Up in recliner at entry.  Transfers Overall transfer level: Needs assistance Equipment used: Rolling walker (2 wheeled) Transfers: Sit to/from Stand Sit to Stand: +2 physical assistance, Min assist Stand  pivot transfers: +2 physical assistance, Min assist General transfer comment: minAx2 for powerup and steadying in RW Ambulation/Gait Ambulation/Gait assistance: Min assist, +2 safety/equipment (Chair to follow) Ambulation Distance (Feet): 50 Feet Assistive device: Rolling walker (2 wheeled) Gait Pattern/deviations: Step-through pattern, Trunk flexed, Decreased step length - right, Decreased step length - left (decreased knee flexion on L) General Gait Details: vc for upright posture, increased heel strike and leg extension at toe off. Gait velocity: slowed Gait velocity interpretation: Below normal speed for age/gender    ADL: ADL Overall ADL's : Needs assistance/impaired Eating/Feeding: Modified independent, Bed level Grooming: Wash/dry hands, Wash/dry face, Set up, Bed level Upper Body Bathing: Maximal assistance, Bed level Lower Body Bathing: Total assistance, Bed level Lower Body Bathing Details (indicate cue type and reason): daughter (A) on arrival Toilet Transfer: Minimal assistance, +2 for physical assistance Toilet Transfer Details (indicate cue type and reason): needs (A) to power up and max cues for hand placement Toileting - Clothing Manipulation Details (indicate cue type and reason): unable to void bladder but reports  the urge Functional mobility during ADLs: Moderate assistance, Rolling walker General ADL Comments: Pt able to power up better today and more alert. Pt with PCA button d/c this session and much more alert  Cognition: Cognition Overall Cognitive Status: Within Functional Limits for tasks assessed Orientation Level: Oriented X4 Cognition Arousal/Alertness: Awake/alert Behavior During Therapy: WFL for tasks assessed/performed Overall Cognitive Status: Within Functional Limits for tasks assessed   Blood pressure (!) 117/49, pulse 97, temperature 98.5 F (36.9 C), temperature source Oral, resp. rate 18, height 5' 3"  (1.6 m), weight 104.8 kg (231 lb), SpO2  92 %. Physical Exam  Vitals reviewed. Constitutional: She is oriented to person, place, and time. She appears well-developed and well-nourished.  obese  HENT:  Head: Normocephalic and atraumatic.  Mouth/Throat: Oropharynx is clear and moist.  Eyes: Conjunctivae are normal. Pupils are equal, round, and reactive to light.  Neck: Normal range of motion. Neck supple. No thyromegaly present.  Cardiovascular: Normal rate and regular rhythm.  Exam reveals no friction rub.   No murmur heard. Respiratory: Effort normal. No stridor.  GI: Soft. She exhibits no distension. There is tenderness.  Lower 1 inch of incision extremely painful to touch. Abdominal wound with VAC in place along medial border of stoma---erythema along the edges of foam. Left knee incision clean,dry, intact --healing well. Diffuse ecchymosis inner left thigh and medial calf.   Musculoskeletal: She exhibits edema. She exhibits no tenderness.  Neurological: She is alert and oriented to person, place, and time. No cranial nerve deficit. Coordination normal.  UE motor 5/5. LE: 3/5 HF, 3+ HF, 4/5 ADF/PF. No sensory deficits.   Skin: Skin is warm and dry.  Psychiatric: She has a normal mood and affect. Her behavior is normal. Thought content normal.    Results for orders placed or performed during the hospital encounter of 03/16/17 (from the past 48 hour(s))  Basic metabolic panel     Status: Abnormal   Collection Time: 03/22/17  4:32 AM  Result Value Ref Range   Sodium 142 135 - 145 mmol/L   Potassium 3.8 3.5 - 5.1 mmol/L   Chloride 107 101 - 111 mmol/L   CO2 28 22 - 32 mmol/L   Glucose, Bld 109 (H) 65 - 99 mg/dL   BUN 11 6 - 20 mg/dL   Creatinine, Ser 1.36 (H) 0.44 - 1.00 mg/dL   Calcium 7.9 (L) 8.9 - 10.3 mg/dL   GFR calc non Af Amer 42 (L) >60 mL/min   GFR calc Af Amer 49 (L) >60 mL/min    Comment: (NOTE) The eGFR has been calculated using the CKD EPI equation. This calculation has not been validated in all clinical  situations. eGFR's persistently <60 mL/min signify possible Chronic Kidney Disease.    Anion gap 7 5 - 15  Urinalysis, Routine w reflex microscopic     Status: Abnormal   Collection Time: 03/22/17 10:12 AM  Result Value Ref Range   Color, Urine YELLOW YELLOW   APPearance CLEAR CLEAR   Specific Gravity, Urine 1.009 1.005 - 1.030   pH 5.0 5.0 - 8.0   Glucose, UA NEGATIVE NEGATIVE mg/dL   Hgb urine dipstick SMALL (A) NEGATIVE   Bilirubin Urine NEGATIVE NEGATIVE   Ketones, ur NEGATIVE NEGATIVE mg/dL   Protein, ur NEGATIVE NEGATIVE mg/dL   Nitrite NEGATIVE NEGATIVE   Leukocytes, UA NEGATIVE NEGATIVE   RBC / HPF 0-5 0 - 5 RBC/hpf   WBC, UA 0-5 0 - 5 WBC/hpf   Bacteria, UA RARE (A)  NONE SEEN   Squamous Epithelial / LPF 0-5 (A) NONE SEEN   Mucous PRESENT   Basic metabolic panel     Status: Abnormal   Collection Time: 03/23/17  5:16 AM  Result Value Ref Range   Sodium 143 135 - 145 mmol/L   Potassium 4.0 3.5 - 5.1 mmol/L   Chloride 105 101 - 111 mmol/L   CO2 30 22 - 32 mmol/L   Glucose, Bld 102 (H) 65 - 99 mg/dL   BUN 9 6 - 20 mg/dL   Creatinine, Ser 1.13 (H) 0.44 - 1.00 mg/dL   Calcium 8.0 (L) 8.9 - 10.3 mg/dL   GFR calc non Af Amer 53 (L) >60 mL/min   GFR calc Af Amer >60 >60 mL/min    Comment: (NOTE) The eGFR has been calculated using the CKD EPI equation. This calculation has not been validated in all clinical situations. eGFR's persistently <60 mL/min signify possible Chronic Kidney Disease.    Anion gap 8 5 - 15  CBC     Status: Abnormal   Collection Time: 03/23/17  5:16 AM  Result Value Ref Range   WBC 15.6 (H) 4.0 - 10.5 K/uL   RBC 2.72 (L) 3.87 - 5.11 MIL/uL   Hemoglobin 8.7 (L) 12.0 - 15.0 g/dL   HCT 26.7 (L) 36.0 - 46.0 %   MCV 98.2 78.0 - 100.0 fL   MCH 32.0 26.0 - 34.0 pg   MCHC 32.6 30.0 - 36.0 g/dL   RDW 14.8 11.5 - 15.5 %   Platelets 308 150 - 400 K/uL   Dg Chest Port 1 View  Result Date: 03/22/2017 CLINICAL DATA:  Follow-up pneumonia. EXAM: PORTABLE  CHEST 1 VIEW COMPARISON:  Chest radiograph performed 03/17/2017 FINDINGS: Persistent bibasilar airspace opacity raises concern for pneumonia. No pleural effusion or pneumothorax is seen. The cardiomediastinal silhouette is normal in size. No acute osseous abnormalities are identified. Clips are seen overlying the right axilla. The question of free intra-abdominal air on the prior study is no longer appreciated on this study, and may have been artifactual in nature. IMPRESSION: Persistent bibasilar airspace opacity raises concern for pneumonia. Electronically Signed   By: Garald Balding M.D.   On: 03/22/2017 19:49   Dg Chest Port 1 View  Result Date: 03/22/2017 CLINICAL DATA:  Leukocytosis . EXAM: PORTABLE CHEST 1 VIEW COMPARISON:  03/17/2017 . FINDINGS: Mediastinum hilar structures are stable. Heart size stable. Mild basilar interstitial prominence suggesting mild pneumonitis. Persistent bibasilar atelectasis, particular on the right. No free air in the hemidiaphragm noted on today's exam. IMPRESSION: 1. Mild bibasilar interstitial prominence. Mild pneumonitis cannot be excluded. 2.  Mild bibasilar atelectasis, particular on the right. Electronically Signed   By: Marcello Moores  Register   On: 03/22/2017 10:03    Medical Problem List and Plan: 1.  Functional and mobility deficits secondary to recent left TKA and debility after perforated colon  -admit to inpatient rehab 2.  DVT Prophylaxis/Anticoagulation: Pharmaceutical: Lovenox 3. Pain Management: continue oxycodone prn. 4. Mood: team to provide ego support. LCSW to follow for evaluation and support. Add protein supplement to promote wound healing.  5. Neuropsych: This patient is capable of making decisions on her own behalf. 6. Skin/Wound Care: routine pressure relief measures. Maintain adequate nutritional and hydration status.   -continue vac  -stoma/ostomy care per [place] RN 7. Fluids/Electrolytes/Nutrition:Monitor I/O. Check lytes in am. 8.  Leucocytosis/FUO: Monitor CBC serially--Augmentin D #1/10 9. HTN: Monitor BP bid. On benazepril daily. 10.   ABLA:  Will recheck  in am. Start iron supplement.   Post Admission Physician Evaluation: 1. Functional deficits secondary  to left TKA/debility. 2. Patient admitted to receive collaborative, interdisciplinary care between the physiatrist, rehab nursing staff, and therapy team. 3. Patient's level of medical complexity and substantial therapy needs in context of that medical necessity cannot be provided at a lesser intensity of care. 4. Patient has experienced substantial functional loss from his/her baseline.  Judging by the patient's diagnosis, physical exam, and functional history, the patient has potential for functional progress which will result in measurable gains while on inpatient rehab.  These gains will be of substantial and practical use upon discharge in facilitating mobility and self-care at the household level. 5. Physiatrist will provide 24 hour management of medical needs as well as oversight of the therapy plan/treatment and provide guidance as appropriate regarding the interaction of the two. 6. 24 hour rehab nursing will assist in the management of  bladder management, bowel management, safety, skin/wound care, disease management, medication administration, pain management and patient education  and help integrate therapy concepts, techniques,education, etc. 7. PT will assess and treat for: DME instruction, Gait training, Stair training, Functional mobility training, Therapeutic activities, Therapeutic exercise, Balance training, Patient/family education.  Goals are: independent with assistive device. 8. OT will assess and treat for Self-care/ADL training, Therapeutic exercise, DME and/or AE instruction, Therapeutic activities, Cognitive remediation/compensation, Patient/family education, Balance training.  Goals are: independent with assistive device.  9. SLP will assess and  treat for  .  Goals are: N/A. 10. Case Management and Social Worker will assess and treat for psychological issues and discharge planning. 11. Team conference will be held weekly to assess progress toward goals and to determine barriers to discharge. 12.  Patient will receive at least 3 hours of therapy per day at least 5 days per week. 13. ELOS and Prognosis: 7-8 days excellent       Meredith Staggers, MD, [facility] 03/23/2017  Reesa Chew, [place] 03/23/2017

## 2017-03-23 NOTE — Progress Notes (Signed)
Patient and family were informed about rehab process including patient safety plan and rehab booklet. 

## 2017-03-23 NOTE — Progress Notes (Signed)
Jamse Arn, MD Physician Signed Physical Medicine and Rehabilitation  Consult Note Date of Service: 03/19/2017 9:47 AM  Related encounter: ED to Hosp-Admission (Current) from 03/16/2017 in [facility] All Collapse All   [] Hide copied text [] Hover for attribution information      Physical Medicine and Rehabilitation Consult   Reason for Consult: L-TKR complicated by perforated colon Referring Physician: Dr. Rosendo Gros   HPI: [name] is a 56 y.o. female with history of lymphoma, L-TKR 5/14 who was discharged to home but readmitted on 5/19 with LLQ pain due to perforated diverticulitis. History taken from chart review and husband. She underwent sigmoid colectomy with diverting colostomy by Dr. Georgette Dover. Post op on IV antibiotics and started on full liquids today. She is currently limited by pain with inability with deficits in mobility. CIR recommended for follow up therapy. 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  Has a supportive husband who's been off to assist after knee surgery, but is planning on going back to work tomorrow.    Review of Systems  Constitutional: Negative for chills.  HENT: Negative for hearing loss and tinnitus.   Eyes: Negative for blurred vision and double vision.  Respiratory: Negative for cough, hemoptysis and wheezing.   Cardiovascular: Negative for chest pain and palpitations.  Gastrointestinal: Positive for abdominal pain. Negative for nausea.  Genitourinary: Negative for dysuria and urgency.  Musculoskeletal: Positive for joint pain. Negative for myalgias.  Skin: Negative for rash.  Neurological: Negative for dizziness, sensory change, weakness and headaches.  Psychiatric/Behavioral: Negative for depression. The patient does not have insomnia.   All other systems reviewed and are negative.         Past Medical History:  Diagnosis Date  . Anxiety   .  Cancer ([place])    hodgkins lymphoma, 1992 Diagnosed  . Hypertension   . Hypothyroidism    "thyroid is dead"  . Primary localized osteoarthritis of left knee          Past Surgical History:  Procedure Laterality Date  . AXILLARY LYMPH NODE DISSECTION Right 1992  . BONE MARROW TRANSPLANT    . COLON RESECTION SIGMOID N/A 03/17/2017   Procedure: COLON RESECTION SIGMOID AND CREATION OF COLOSTOMY;  Surgeon: Donnie Mesa, MD;  Location: [facility];  Service: General;  Laterality: N/A;  . COLONOSCOPY N/A 09/07/2015   Procedure: COLONOSCOPY;  Surgeon: Aviva Signs, MD;  [facility];  Service: Gastroenterology;  Laterality: N/A;  . ECTOPIC PREGNANCY SURGERY  1990  . TONSILLECTOMY  1965  . TOTAL KNEE ARTHROPLASTY Left 03/12/2017   Procedure: LEFT TOTAL KNEE ARTHROPLASTY;  Surgeon: Elsie Saas, MD;  Location: [facility];  Service: Orthopedics;  Laterality: Left;         Family History  Problem Relation Age of Onset  . Breast cancer Mother   . Diabetes Father     Social History:  Married. Independent without AD. She reports that she quit smoking about 26 years ago. She has never used smokeless tobacco. She reports that she does not drink alcohol or use drugs.        Allergies  Allergen Reactions  . Iodine-131 Nausea And Vomiting  . Cephalexin Rash  . Codeine Itching and Nausea Only  . Contrast Media [Iodinated Diagnostic Agents] Nausea And Vomiting          Medications Prior to Admission  Medication Sig Dispense Refill  .  acetaminophen (TYLENOL) 325 MG tablet Take 2 tablets (650 mg total) by mouth every 6 (six) hours as needed for mild pain (or Fever >/= 101).    Marland Kitchen amLODipine (NORVASC) 10 MG tablet Take 10 mg by mouth every evening.     Marland Kitchen aspirin EC 325 MG EC tablet 1 tab a day for the next 30 days to prevent blood clots (Patient taking differently: Take 325 mg by mouth daily. for the next 30 days to prevent blood clots) 30 tablet 0  . benazepril  (LOTENSIN) 20 MG tablet Take 20 mg by mouth daily.    . diphenhydrAMINE (BENADRYL) 25 mg capsule Take 25 mg by mouth at bedtime.    . docusate sodium (COLACE) 100 MG capsule 1 tab 2 times a day while on narcotics.  STOOL SOFTENER (Patient taking differently: Take 100 mg by mouth 2 (two) times daily. while on narcotics.) 60 capsule 0  . HYDROmorphone (DILAUDID) 2 MG tablet 1-2 tablets every 4 hrs as needed for pain (Patient taking differently: Take 1-2 mg by mouth every 4 (four) hours as needed for severe pain. ) 84 tablet 0  . levothyroxine (SYNTHROID, LEVOTHROID) 88 MCG tablet Take 88 mcg by mouth daily before breakfast.    . loratadine (CLARITIN) 10 MG tablet Take 10 mg by mouth daily.    . polyethylene glycol (MIRALAX / GLYCOLAX) packet 17grams in 16 oz of water twice a day until bowel movement.  LAXITIVE.  Restart if two days since last bowel movement 14 each 0  . pravastatin (PRAVACHOL) 20 MG tablet Take 20 mg by mouth every evening.     . Probiotic Product (PHILLIPS COLON HEALTH PO) Take 1 tablet by mouth daily.    . TURMERIC PO Take 1 tablet by mouth daily.      Home: Home Living Family/patient expects to be discharged to:: Private residence Living Arrangements: Spouse/significant other Available Help at Discharge: Family, Available 24 hours/day Type of Home: House Home Access: Stairs to enter [employer] of Steps: 3 Entrance Stairs-Rails: None Home Layout: One level Bathroom Shower/Tub: Tub/shower unit, Architectural technologist: Standard Bathroom Accessibility: Yes Home Equipment: Environmental consultant - 2 wheels, Bedside commode  Functional History: Prior Function Level of Independence: Independent Functional Status:  Mobility: Bed Mobility Overal bed mobility: Needs Assistance Bed Mobility: Rolling, Sidelying to Sit Rolling: Mod assist Sidelying to sit: Max assist General bed mobility comments: Heavy mod assist and use of bed pad to fully roll into sidelying;  Max assist to clear feet from EOB and elevate trunk to sitting Transfers Overall transfer level: Needs assistance Equipment used: 2 person hand held assist Transfers: Sit to/from Stand, Stand Pivot Transfers Sit to Stand: +2 physical assistance, Mod assist Stand pivot transfers: +2 physical assistance, Mod assist General transfer comment: Mod assist to power up and support diring transition to chair  ADL:  Cognition: Cognition Overall Cognitive Status: Within Functional Limits for tasks assessed Orientation Level: Oriented X4 Cognition Arousal/Alertness: Awake/alert Behavior During Therapy: WFL for tasks assessed/performed Overall Cognitive Status: Within Functional Limits for tasks assessed  Blood pressure 122/61, pulse (!) 103, temperature 98 F (36.7 C), temperature source Oral, resp. rate 19, height 5\' 3"  (1.6 m), weight 104.8 kg (231 lb), SpO2 95 %. Physical Exam  Nursing note and vitals reviewed. Constitutional: She is oriented to person, place, and time. She appears well-developed. She appears lethargic. She is easily aroused. Nasal cannula in place.  Morbidly obese.   HENT:  Head: Normocephalic and atraumatic.  Eyes: Conjunctivae and  EOM are normal. Pupils are equal, round, and reactive to light.  Neck: Normal range of motion. Neck supple.  Cardiovascular: Normal rate and regular rhythm.   Respiratory: Effort normal and breath sounds normal. No stridor. No respiratory distress. She has no wheezes.  +[place]  GI: Soft. Bowel sounds are normal. She exhibits no distension. There is tenderness.  Abdominal VAC in place with patent colostomy.   Musculoskeletal: She exhibits edema and tenderness.  Left knee incision with min edema, clean, dry and intact---healing without s/s of infection. Able to flex 30 degrees without discomfort.  Neurological: She is oriented to person, place, and time and easily aroused. She appears lethargic.  Had difficulty staying wake but interacts  appropriately (recently received pain meds). Speech clear and able to follow basic commands without difficulty.  Motor; B/l UE 4+/5 proximal to distal LLE: HF 3/5, ADF/PF 4/5 RLE: 4/5 proximal to distal  Skin: Skin is warm and dry.  See above  Psychiatric: She has a normal mood and affect. Her behavior is normal. Thought content normal.    Lab Results Last 24 Hours       Results for orders placed or performed during the hospital encounter of 03/16/17 (from the past 24 hour(s))  CBC     Status: Abnormal   Collection Time: 03/19/17  4:30 AM  Result Value Ref Range   WBC 15.7 (H) 4.0 - 10.5 K/uL   RBC 2.98 (L) 3.87 - 5.11 MIL/uL   Hemoglobin 9.3 (L) 12.0 - 15.0 g/dL   HCT 29.2 (L) 36.0 - 46.0 %   MCV 98.0 78.0 - 100.0 fL   MCH 31.2 26.0 - 34.0 pg   MCHC 31.8 30.0 - 36.0 g/dL   RDW 14.3 11.5 - 15.5 %   Platelets 215 150 - 400 K/uL  Basic metabolic panel     Status: Abnormal   Collection Time: 03/19/17  4:30 AM  Result Value Ref Range   Sodium 134 (L) 135 - 145 mmol/L   Potassium 4.0 3.5 - 5.1 mmol/L   Chloride 102 101 - 111 mmol/L   CO2 25 22 - 32 mmol/L   Glucose, Bld 114 (H) 65 - 99 mg/dL   BUN 8 6 - 20 mg/dL   Creatinine, Ser 0.48 0.44 - 1.00 mg/dL   Calcium 7.6 (L) 8.9 - 10.3 mg/dL   GFR calc non Af Amer >60 >60 mL/min   GFR calc Af Amer >60 >60 mL/min   Anion gap 7 5 - 15     Imaging Results (Last 48 hours)  No results found.    Assessment/Plan: Diagnosis: L-TKR complicated by perforated colon Labs and images independently reviewed.  Records reviewed and summated above.  1. Does the need for close, 24 hr/day medical supervision in concert with the patient's rehab needs make it unreasonable for this patient to be served in a less intensive setting? Yes 2. Co-Morbidities requiring supervision/potential complications: lymphoma (cont monitor), L-TKR 5/14, perforated diverticulitis (cont IV Vanc/Zosyn), full liquid diet (advance as tolerated),  Hyperglycemia (Monitor in accordance with exercise and adjust meds as necessary), post-op pain (Biofeedback training with therapies to help reduce reliance on opiate pain medications, particularly dilaudid PCA, monitor pain control during therapies, and sedation at rest and titrate to maximum efficacy to ensure participation and gains in therapies) 3. Due to bladder management, bowel management, safety, skin/wound care, disease management, pain management and patient education, does the patient require 24 hr/day rehab nursing? Yes 4. Does the patient require coordinated care of  a physician, rehab nurse, PT (1-2 hrs/day, 5 days/week) and OT (1-2 hrs/day, 5 days/week) to address physical and functional deficits in the context of the above medical diagnosis(es)? Yes Addressing deficits in the following areas: balance, endurance, locomotion, strength, transferring, bowel/bladder control, bathing, dressing, toileting and psychosocial support 5. Can the patient actively participate in an intensive therapy program of at least 3 hrs of therapy per day at least 5 days per week? Potentially 6. The potential for patient to make measurable gains while on inpatient rehab is excellent 7. Anticipated functional outcomes upon discharge from inpatient rehab are modified independent and supervision  with PT, modified independent and supervision with OT, n/a with SLP. 8. Estimated rehab length of stay to reach the above functional goals is: 8-13 days. 9. Anticipated D/C setting: Home 10. Anticipated post D/C treatments: HH therapy and Home excercise program 11. Overall Rehab/Functional Prognosis: excellent  RECOMMENDATIONS: This patient's condition is appropriate for continued rehabilitative care in the following setting: Will follow as pain improves.  Anticipate, pt will have functional improvements accordingly. Patient has agreed to participate in recommended program. Potentially Note that insurance prior  authorization may be required for reimbursement for recommended care.  Comment: Rehab Admissions Coordinator to follow up.  Delice Lesch, MD, Mellody Drown [place]ary [place], [place] 03/19/2017    Revision History                        Routing History

## 2017-03-23 NOTE — Consult Note (Signed)
[facility] Nurse wound follow up Wound type: surgical  Measurement:20cm x 4.5cm x 2cm  Wound bed: clean, pink, moist Drainage (amount, consistency, odor) minimal, serosanguinous in canister Periwound: intact  Dressing procedure/placement/frequency: 1pc of black foam used to fill wound bed. Sealed at 156mmHG. Patient tolerated well without use of IV pain meds.   Ostomy pouch intact, now with stool output. Plans for patient to transfer to inpatient rehab.  I will continue to follow patient. Made husband aware that patient's sister took a video on Wednesday of this week of me changing the pouch so that they would have as resource at home.  [place], [place]

## 2017-03-23 NOTE — Progress Notes (Signed)
Physical Therapy Treatment Patient Details Name: [name] MRN: [id number] DOB: May 27, 1961 Today's Date: 03/23/2017    History of Present Illness Admitted with peritonitis secondary to perforated sigmoid diverticulitis; now 5/19 s/p exploratory laparotomy, sigmoid colectomy, creation of descending colostomy, VAC placement; Of note, she had a TKA 5/14;  has a past medical history of Anxiety; Cancer ([place]); Hypertension; Hypothyroidism; and Primary localized osteoarthritis of left knee.     PT Comments    Pt is making steady progress towards her goals. Pt mobility has increased as abdominal pain decreased. Pt currently, minA for transfers with RW from recliner and toilet, and minAx1 for ambulation of 80 feet with close chair follow. Pt continues to perform therapeutic exercise to increase her knee ROM in her room in between therapy sessions. Pt requires skilled PT to progress ambulation and to improve LE strength, ROM and endurance to safely navigate in her discharge environment.   Follow Up Recommendations  CIR     Equipment Recommendations  Rolling walker with 5" wheels;3in1 (PT)    Recommendations for Other Services OT consult;Rehab consult     Precautions / Restrictions Precautions Precautions: Knee;Fall Precaution Booklet Issued: No Required Braces or Orthoses: Other Brace/Splint;Knee Immobilizer - Left Knee Immobilizer - Left: On when out of bed or walking Other Brace/Splint: abdominal binder Restrictions Weight Bearing Restrictions: No LLE Weight Bearing: Weight bearing as tolerated    Mobility  Bed Mobility               General bed mobility comments: Up in recliner at entry.   Transfers Overall transfer level: Needs assistance Equipment used: Rolling walker (2 wheeled) Transfers: Sit to/from Stand Sit to Stand: Min assist         General transfer comment: minAx for powerup   Ambulation/Gait Ambulation/Gait assistance: Min assist;+2 safety/equipment  (Chair to follow) Ambulation Distance (Feet): 80 Feet Assistive device: Rolling walker (2 wheeled) Gait Pattern/deviations: Step-through pattern;Trunk flexed;Decreased step length - right;Decreased step length - left Gait velocity: slowed Gait velocity interpretation: Below normal speed for age/gender General Gait Details: slow, steady cadence, vc for upright posture and staying in middle of walker      Balance Overall balance assessment: Needs assistance Sitting-balance support: No upper extremity supported;Feet supported Sitting balance-Leahy Scale: Good     Standing balance support: During functional activity;No upper extremity supported Standing balance-Leahy Scale: Fair Standing balance comment: able to wash hands at sink with no UE support                            Cognition Arousal/Alertness: Awake/alert Behavior During Therapy: WFL for tasks assessed/performed Overall Cognitive Status: Within Functional Limits for tasks assessed                                        Exercises Total Joint Exercises Quad Sets: AROM;Left;10 reps;Seated Knee Flexion: AROM;Seated;Left;10 reps (AAROM with opposite leg assist 10x 5sec hold)        Pertinent Vitals/Pain Pain Assessment: Faces Faces Pain Scale: Hurts a little bit Pain Location: Abdomen pain Pain Descriptors / Indicators: Grimacing;Guarding;Discomfort Pain Intervention(s): Monitored during session;Repositioned           PT Goals (current goals can now be found in the care plan section) Acute Rehab PT Goals Patient Stated Goal: be able to move well enough to get home PT Goal Formulation: With  patient Time For Goal Achievement: 04/01/17 Potential to Achieve Goals: Good Progress towards PT goals: Progressing toward goals    Frequency    7X/week      PT Plan Current plan remains appropriate       AM-PAC PT "6 Clicks" Daily Activity  Outcome Measure  Difficulty turning over in  bed (including adjusting bedclothes, sheets and blankets)?: Total Difficulty moving from lying on back to sitting on the side of the bed? : Total Difficulty sitting down on and standing up from a chair with arms (e.g., wheelchair, bedside commode, etc,.)?: Total Help needed moving to and from a bed to chair (including a wheelchair)?: A Little Help needed walking in hospital room?: A Little Help needed climbing 3-5 steps with a railing? : Total 6 Click Score: 10    End of Session Equipment Utilized During Treatment: Gait belt Activity Tolerance: Patient tolerated treatment well;Patient limited by pain Patient left: in chair;with call bell/phone within reach;with chair alarm set Nurse Communication: Mobility status;Other (comment) (need for abdominal binder) PT Visit Diagnosis: Other abnormalities of gait and mobility (R26.89);Pain Pain - Right/Left: Left (abdomen ) Pain - part of body: Knee     Time: 2060-1561 PT Time Calculation (min) (ACUTE ONLY): 21 min  Charges:  $Gait Training: 8-22 mins                    G Codes:       Yashas Camilli B. Migdalia Dk PT, DPT Acute Rehabilitation  [phone] Pager [phone]     [place] 03/23/2017, 2:32 PM

## 2017-03-23 NOTE — Clinical Social Work Note (Signed)
CSW consulted for CIR back up. Pt accepted to CIR today and insurance auth already received, per CIR admission note. CSW signing off as no further social work needs identified.   [name], [name], [name] [phone]

## 2017-03-23 NOTE — Consult Note (Signed)
  [facility] Nurse wound follow up Wound type: surgical  Measurement: see measurement from Monday Wound bed: clean, pink, moist Drainage (amount, consistency, odor) minimal Periwound: intact  Dressing procedure/placement/frequency:1pc of black foam used in wound bed, sealed at 152mmHG.  Patient tolerated well, received IV pain meds prior to dressing change.  CCS at bedside to assess wound and ostomy  WOC Nurse ostomy follow up Stoma type/location: LLQ, end colostomy Stomal assessment/size: 1"x 1 3/4" oval shaped, in skin fold, slightly budded Peristomal assessment: intact  Treatment options for stomal/peristomal skin: using 1/2 of barrier ring on each side of the stoma at 3 and 9 oclock to address abdominal contour issues Output none Ostomy pouching: 1pc.soft convex with 2" barrier ring.  Education provided:  Sister at bedside, patient is quite sleepy from narcotics received for dressing change. I have demonstrated all of the steps of the pouch change again, sister made a video of me showing each step with my permission.  Enrolled patient in [facility] Discharge program:No  [place] Nurse will follow along with you for continued support with ostomy teaching and care and change of NPWT VAC dressing due to the proximity of the wound to the ostomy [place] MSN, [place], [place], [place]

## 2017-03-23 NOTE — Progress Notes (Signed)
I met with pt and her spouse at bedside to clarify discharge preference. Both pt and her spouse are requesting inpt rehab admit which I have insurance approval and bed available today. Noted Pt just returned from her CT scan. I await medical clearance to possible admit pt to CIR today.I discussed with Brigid Re, [place]. [phone]

## 2017-03-23 NOTE — Progress Notes (Signed)
Patient was transferred to 4MO2, report given to [facility].

## 2017-03-23 NOTE — Discharge Summary (Signed)
[facility] Discharge Summary   [name] ID: [name] MRN: [id number] DOB/AGE: May 24, 1961 55 y.o.  Admit date: 03/16/2017 Discharge date: 03/23/2017  Admitting Diagnosis: Perforated diverticulum of sigmoid colon  Discharge Diagnosis [name] Active Problem List   Diagnosis Date Noted  . S/P total knee replacement using cement, left 03/12/2017 by Dr Noemi Chapel 03/19/2017  . Perforated diverticulum   . History of lymphoma   . Hyperglycemia   . Post-op pain   . Perforation of sigmoid colon due to diverticulitis 03/17/2017  . Primary localized osteoarthritis of left knee   . Hypothyroidism   . Hypertension   . Cancer ([place])   . OSTEOARTHRITIS, KNEE, RIGHT 03/30/2010    Consultants Wound Ostomy Care Orthopedics - Dr. Noemi Chapel  Imaging: Ct Abdomen Pelvis W Contrast  Result Date: 03/23/2017 CLINICAL DATA:  Leukocytosis EXAM: CT ABDOMEN AND PELVIS WITH CONTRAST TECHNIQUE: Multidetector CT imaging of the abdomen and pelvis was performed using the standard protocol following bolus administration of intravenous contrast. CONTRAST:  54mL ISOVUE-300 IOPAMIDOL (ISOVUE-300) INJECTION 61% COMPARISON:  03/17/2017 FINDINGS: Lower chest: Small bilateral pleural effusions. Dependent atelectasis. Heart is normal size. Hepatobiliary: No focal hepatic abnormality. Gallbladder unremarkable. Pancreas: No focal abnormality or ductal dilatation. Spleen: No focal abnormality.  Normal size. Adrenals/Urinary Tract: Large parapelvic cyst in the right kidney centrally. Smaller parapelvic cysts on the left. No hydronephrosis. Small scattered cortical cysts. Adrenal glands and urinary bladder are unremarkable. Punctate nonobstructing stones in the mid and upper pole of the right kidney. Stomach/Bowel: Postoperative changes in the colon with Hartmann's pouch and left lower quadrant colostomy. No evidence of bowel obstruction. Stomach and small bowel decompressed, unremarkable. Vascular/Lymphatic: Diffuse  aortic and iliac calcifications. No aneurysm or adenopathy. Calcified right aortocaval lymph nodes, stable. Reproductive: Uterus and adnexa unremarkable.  No mass. Other: Postoperative changes with fluid, stranding and gas locules in the anterior abdominal wall in the midline. Small amount of free fluid in the left paracolic gutter. No free air. Musculoskeletal: No acute bony abnormality. IMPRESSION: Postoperative changes with left lower quadrant colostomy. Stranding, fluid and gas within the anterior abdominal wall compatible with recent postoperative change. Small amount of free fluid in the left paracolic gutter. Bilateral renal parapelvic and cortical cysts.  No hydronephrosis. Right nephrolithiasis. Trace bilateral pleural effusions. Electronically Signed   By: Rolm Baptise M.D.   On: 03/23/2017 10:57   Dg Chest Port 1 View  Result Date: 03/22/2017 CLINICAL DATA:  Follow-up pneumonia. EXAM: PORTABLE CHEST 1 VIEW COMPARISON:  Chest radiograph performed 03/17/2017 FINDINGS: Persistent bibasilar airspace opacity raises concern for pneumonia. No pleural effusion or pneumothorax is seen. The cardiomediastinal silhouette is normal in size. No acute osseous abnormalities are identified. Clips are seen overlying the right axilla. The question of free intra-abdominal air on the prior study is no longer appreciated on this study, and may have been artifactual in nature. IMPRESSION: Persistent bibasilar airspace opacity raises concern for pneumonia. Electronically Signed   By: Garald Balding M.D.   On: 03/22/2017 19:49   Dg Chest Port 1 View  Result Date: 03/22/2017 CLINICAL DATA:  Leukocytosis . EXAM: PORTABLE CHEST 1 VIEW COMPARISON:  03/17/2017 . FINDINGS: Mediastinum hilar structures are stable. Heart size stable. Mild basilar interstitial prominence suggesting mild pneumonitis. Persistent bibasilar atelectasis, particular on the right. No free air in the hemidiaphragm noted on today's exam. IMPRESSION: 1.  Mild bibasilar interstitial prominence. Mild pneumonitis cannot be excluded. 2.  Mild bibasilar atelectasis, particular on the right. Electronically Signed   By: Marcello Moores  Register   On: 03/22/2017 10:03    Procedures Exploratory laparotomy with sigmoid colectomy, creation of Hartmann's pouch, creation of descending colostomy - 03/17/17 Dr. Rodman Key [facility] Course:  [name] is a 56 y.o. female who presented to [facility] with LLQ pain four days after left knee replacement by Dr. Noemi Chapel. Workup showed perforated diverticulitis. She was taken to the OR and underwent above procedure. Wound vac was placed over midline abdominal wound. [name] tolerated procedure and was admitted to med-surg. WOC was consulted for management of wound vac and colostomy. POD#1 [name] was tolerating clear liquids. POD#3 [name] was passing flatus and mobilizing with therapies. POD#5 [name] began to have stool output in her colostomy and she was advanced to a soft diet. [name] remained on IV zosyn and vancomycin postoperatively for treatment of intraabdominal infection. On POD#5 [name] was noted to have an elevated WBC and low grade fever, workup for source of infection was negative. POD#6 the WBC was coming back down and the [name] was afebrile. Vancomycin was stopped 03/21/17 due to elevated creatinine. Zosyn was converted to PO augmentin on 03/23/17 and should be continued for 10 days. [name] is tolerating PO intake, voiding well, mobilizing with therapies, pain is well controlled. While hospitalized orthopedics has been following [name] and managing post operative care of left total knee replacement.  [name] is at this time stable for discharge to inpatient rehab.    Allergies as of 03/23/2017      Reactions   Iodine-131 Nausea And Vomiting   Cephalexin Rash   Codeine Itching, Nausea Only   Contrast Media [iodinated Diagnostic Agents] Nausea And Vomiting      Medication List    ASK your doctor about these  medications   acetaminophen 325 MG tablet Commonly known as:  TYLENOL Take 2 tablets (650 mg total) by mouth every 6 (six) hours as needed for mild pain (or Fever >/= 101).   amLODipine 10 MG tablet Commonly known as:  NORVASC Take 10 mg by mouth every evening.   aspirin 325 MG EC tablet 1 tab a day for the next 30 days to prevent blood clots   benazepril 20 MG tablet Commonly known as:  LOTENSIN Take 20 mg by mouth daily.   diphenhydrAMINE 25 mg capsule Commonly known as:  BENADRYL Take 25 mg by mouth at bedtime.   docusate sodium 100 MG capsule Commonly known as:  COLACE 1 tab 2 times a day while on narcotics.  STOOL SOFTENER   HYDROmorphone 2 MG tablet Commonly known as:  DILAUDID 1-2 tablets every 4 hrs as needed for pain   levothyroxine 88 MCG tablet Commonly known as:  SYNTHROID, LEVOTHROID Take 88 mcg by mouth daily before breakfast.   loratadine 10 MG tablet Commonly known as:  CLARITIN Take 10 mg by mouth daily.   PHILLIPS COLON HEALTH PO Take 1 tablet by mouth daily.   polyethylene glycol packet Commonly known as:  MIRALAX / GLYCOLAX 17grams in 16 oz of water twice a day until bowel movement.  LAXITIVE.  Restart if two days since last bowel movement   pravastatin 20 MG tablet Commonly known as:  PRAVACHOL Take 20 mg by mouth every evening.   TURMERIC PO Take 1 tablet by mouth daily.          Signed: Brigid Re, [facility] Surgery 03/23/2017, 2:36 PM Pager: [phone] Consults: [phone] Mon-Fri 7:00 am-4:30 pm Sat-Sun 7:00 am-11:30 am

## 2017-03-23 NOTE — H&P (Signed)
Physical Medicine and Rehabilitation Admission H&P       Chief Complaint  Patient presents with  . Abdominal Pain    HPI: [name] a 56 y.o.femalewith history of lymphoma, L-TKR 5/14 who was discharged to home but readmitted on 5/19 with LLQ pain due to perforated diverticulitis. History taken from chart review and husband. She underwent sigmoid colectomy with diverting colostomy by Dr. Georgette Dover. Post op on IV antibiotics and diet has slowly been advanced to soft. Wound VAC in place and being changed MWF by WOC. She has had persistent leucocytosis and spiked temp 101 on 5/22 therefore started on Vanc/Zosyn. She has defervesced and antibiotic narrowed to Augmentin X 10 days today.   She is currently limited by pain with inability with deficits in mobility. CIR recommended for follow up therapy.    Review of Systems  Constitutional: Negative for diaphoresis and malaise/fatigue.  HENT: Negative for hearing loss and tinnitus.   Eyes: Negative for blurred vision and double vision.  Respiratory: Negative for cough.   Cardiovascular: Negative for chest pain and palpitations.  Gastrointestinal: Positive for abdominal pain. Negative for constipation, heartburn and nausea.  Genitourinary: Negative for dysuria and urgency.  Musculoskeletal: Positive for back pain. Negative for myalgias.  Skin: Negative for itching and rash.  Neurological: Negative for dizziness and speech change.  Psychiatric/Behavioral: Positive for depression. The patient is not nervous/anxious and does not have insomnia.          Past Medical History:  Diagnosis Date  . Anxiety   . Cancer ([place])    hodgkins lymphoma, 1992 Diagnosed  . Hypertension   . Hypothyroidism    "thyroid is dead"  . Primary localized osteoarthritis of left knee   . S/P total knee replacement using cement, left 03/19/2017         Past Surgical History:  Procedure Laterality Date  . AXILLARY LYMPH NODE DISSECTION Right  1992  . BONE MARROW TRANSPLANT    . COLON RESECTION SIGMOID N/A 03/17/2017   Procedure: COLON RESECTION SIGMOID AND CREATION OF COLOSTOMY;  Surgeon: Donnie Mesa, MD;  Location: [facility];  Service: General;  Laterality: N/A;  . COLONOSCOPY N/A 09/07/2015   Procedure: COLONOSCOPY;  Surgeon: Aviva Signs, MD;  [facility];  Service: Gastroenterology;  Laterality: N/A;  . ECTOPIC PREGNANCY SURGERY  1990  . TONSILLECTOMY  1965  . TOTAL KNEE ARTHROPLASTY Left 03/12/2017   Procedure: LEFT TOTAL KNEE ARTHROPLASTY;  Surgeon: Elsie Saas, MD;  Location: [facility];  Service: Orthopedics;  Laterality: Left;         Family History  Problem Relation Age of Onset  . Breast cancer Mother   . Diabetes Father     Social History:  Married. Independent without AD and was working prior to knee surgery. 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  She reports that she quit smoking about 26 years ago. She has never used smokeless tobacco. She reports that she does not drink alcohol or use drugs.         Allergies  Allergen Reactions  . Iodine-131 Nausea And Vomiting  . Cephalexin Rash  . Codeine Itching and Nausea Only  . Contrast Media [Iodinated Diagnostic Agents] Nausea And Vomiting          Medications Prior to Admission  Medication Sig Dispense Refill  . acetaminophen (TYLENOL) 325 MG tablet Take 2 tablets (650 mg total) by mouth every 6 (six) hours as needed for mild pain (or Fever >/=  101).    . amLODipine (NORVASC) 10 MG tablet Take 10 mg by mouth every evening.     Marland Kitchen aspirin EC 325 MG EC tablet 1 tab a day for the next 30 days to prevent blood clots (Patient taking differently: Take 325 mg by mouth daily. for the next 30 days to prevent blood clots) 30 tablet 0  . benazepril (LOTENSIN) 20 MG tablet Take 20 mg by mouth daily.    . diphenhydrAMINE (BENADRYL) 25 mg capsule Take 25 mg by mouth at bedtime.    . docusate sodium (COLACE) 100 MG capsule 1 tab 2 times a  day while on narcotics.  STOOL SOFTENER (Patient taking differently: Take 100 mg by mouth 2 (two) times daily. while on narcotics.) 60 capsule 0  . HYDROmorphone (DILAUDID) 2 MG tablet 1-2 tablets every 4 hrs as needed for pain (Patient taking differently: Take 1-2 mg by mouth every 4 (four) hours as needed for severe pain. ) 84 tablet 0  . levothyroxine (SYNTHROID, LEVOTHROID) 88 MCG tablet Take 88 mcg by mouth daily before breakfast.    . loratadine (CLARITIN) 10 MG tablet Take 10 mg by mouth daily.    . polyethylene glycol (MIRALAX / GLYCOLAX) packet 17grams in 16 oz of water twice a day until bowel movement.  LAXITIVE.  Restart if two days since last bowel movement 14 each 0  . pravastatin (PRAVACHOL) 20 MG tablet Take 20 mg by mouth every evening.     . Probiotic Product (PHILLIPS COLON HEALTH PO) Take 1 tablet by mouth daily.    . TURMERIC PO Take 1 tablet by mouth daily.      Home: Home Living Family/patient expects to be discharged to:: Private residence Living Arrangements: Spouse/significant other Available Help at Discharge:  (spouse may take FMLA for when pt returns home.) Type of Home: House Home Access: Stairs to enter [employer] of Steps: 3 Entrance Stairs-Rails: None Home Layout: One level Bathroom Shower/Tub: Tub/shower unit, Architectural technologist: Standard Bathroom Accessibility: Yes Home Equipment: Environmental consultant - 2 wheels, Bedside commode Additional Comments: Homehealth prior to admission for L TKA  Lives With: Spouse   Functional History: Prior Function Level of Independence: Independent  Functional Status:  Mobility: Bed Mobility Overal bed mobility: Needs Assistance Bed Mobility: Supine to Sit Rolling: Max assist Sidelying to sit: Mod assist Supine to sit: Max assist General bed mobility comments: Up in recliner at entry.  Transfers Overall transfer level: Needs assistance Equipment used: Rolling walker (2 wheeled) Transfers: Sit  to/from Stand Sit to Stand: +2 physical assistance, Min assist Stand pivot transfers: +2 physical assistance, Min assist General transfer comment: minAx2 for powerup and steadying in RW Ambulation/Gait Ambulation/Gait assistance: Min assist, +2 safety/equipment (Chair to follow) Ambulation Distance (Feet): 50 Feet Assistive device: Rolling walker (2 wheeled) Gait Pattern/deviations: Step-through pattern, Trunk flexed, Decreased step length - right, Decreased step length - left (decreased knee flexion on L) General Gait Details: vc for upright posture, increased heel strike and leg extension at toe off. Gait velocity: slowed Gait velocity interpretation: Below normal speed for age/gender  ADL: ADL Overall ADL's : Needs assistance/impaired Eating/Feeding: Modified independent, Bed level Grooming: Wash/dry hands, Wash/dry face, Set up, Bed level Upper Body Bathing: Maximal assistance, Bed level Lower Body Bathing: Total assistance, Bed level Lower Body Bathing Details (indicate cue type and reason): daughter (A) on arrival Toilet Transfer: Minimal assistance, +2 for physical assistance Toilet Transfer Details (indicate cue type and reason): needs (A) to power up and max  cues for hand placement Toileting - Clothing Manipulation Details (indicate cue type and reason): unable to void bladder but reports the urge Functional mobility during ADLs: Moderate assistance, Rolling walker General ADL Comments: Pt able to power up better today and more alert. Pt with PCA button d/c this session and much more alert  Cognition: Cognition Overall Cognitive Status: Within Functional Limits for tasks assessed Orientation Level: Oriented X4 Cognition Arousal/Alertness: Awake/alert Behavior During Therapy: WFL for tasks assessed/performed Overall Cognitive Status: Within Functional Limits for tasks assessed   Blood pressure (!) 117/49, pulse 97, temperature 98.5 F (36.9 C), temperature source  Oral, resp. rate 18, height _0  (1.6 m), weight 104.8 kg (231 lb), SpO2 92 %. Physical Exam  Vitals reviewed. Constitutional: She is oriented to person, place, and time. She appears well-developed and well-nourished.  obese  HENT:  Head: Normocephalic and atraumatic.  Mouth/Throat: Oropharynx is clear and moist.  Eyes: Conjunctivae are normal. Pupils are equal, round, and reactive to light.  Neck: Normal range of motion. Neck supple. No thyromegaly present.  Cardiovascular: Normal rate and regular rhythm.  Exam reveals no friction rub.   No murmur heard. Respiratory: Effort normal. No stridor.  GI: Soft. She exhibits no distension. There is tenderness.  Lower 1 inch of incision extremely painful to touch. Abdominal wound with VAC in place along medial border of stoma---erythema along the edges of foam. Left knee incision clean,dry, intact --healing well. Diffuse ecchymosis inner left thigh and medial calf.   Musculoskeletal: She exhibits edema. She exhibits no tenderness.  Neurological: She is alert and oriented to person, place, and time. No cranial nerve deficit. Coordination normal.  UE motor 5/5. LE: 3/5 HF, 3+ HF, 4/5 ADF/PF. No sensory deficits.   Skin: Skin is warm and dry.  Psychiatric: She has a normal mood and affect. Her behavior is normal. Thought content normal.    Lab Results Last 48 Hours        Results for orders placed or performed during the hospital encounter of 03/16/17 (from the past 48 hour(s))  Basic metabolic panel     Status: Abnormal   Collection Time: 03/22/17  4:32 AM  Result Value Ref Range   Sodium 142 135 - 145 mmol/L   Potassium 3.8 3.5 - 5.1 mmol/L   Chloride 107 101 - 111 mmol/L   CO2 28 22 - 32 mmol/L   Glucose, Bld 109 (H) 65 - 99 mg/dL   BUN 11 6 - 20 mg/dL   Creatinine, Ser 1.36 (H) 0.44 - 1.00 mg/dL   Calcium 7.9 (L) 8.9 - 10.3 mg/dL   GFR calc non Af Amer 42 (L) >60 mL/min   GFR calc Af Amer 49 (L) >60 mL/min    Comment:  (NOTE) The eGFR has been calculated using the CKD EPI equation. This calculation has not been validated in all clinical situations. eGFR's persistently <60 mL/min signify possible Chronic Kidney Disease.    Anion gap 7 5 - 15  Urinalysis, Routine w reflex microscopic     Status: Abnormal   Collection Time: 03/22/17 10:12 AM  Result Value Ref Range   Color, Urine YELLOW YELLOW   APPearance CLEAR CLEAR   Specific Gravity, Urine 1.009 1.005 - 1.030   pH 5.0 5.0 - 8.0   Glucose, UA NEGATIVE NEGATIVE mg/dL   Hgb urine dipstick SMALL (A) NEGATIVE   Bilirubin Urine NEGATIVE NEGATIVE   Ketones, ur NEGATIVE NEGATIVE mg/dL   Protein, ur NEGATIVE NEGATIVE mg/dL   Nitrite NEGATIVE  NEGATIVE   Leukocytes, UA NEGATIVE NEGATIVE   RBC / HPF 0-5 0 - 5 RBC/hpf   WBC, UA 0-5 0 - 5 WBC/hpf   Bacteria, UA RARE (A) NONE SEEN   Squamous Epithelial / LPF 0-5 (A) NONE SEEN   Mucous PRESENT   Basic metabolic panel     Status: Abnormal   Collection Time: 03/23/17  5:16 AM  Result Value Ref Range   Sodium 143 135 - 145 mmol/L   Potassium 4.0 3.5 - 5.1 mmol/L   Chloride 105 101 - 111 mmol/L   CO2 30 22 - 32 mmol/L   Glucose, Bld 102 (H) 65 - 99 mg/dL   BUN 9 6 - 20 mg/dL   Creatinine, Ser 1.13 (H) 0.44 - 1.00 mg/dL   Calcium 8.0 (L) 8.9 - 10.3 mg/dL   GFR calc non Af Amer 53 (L) >60 mL/min   GFR calc Af Amer >60 >60 mL/min    Comment: (NOTE) The eGFR has been calculated using the CKD EPI equation. This calculation has not been validated in all clinical situations. eGFR's persistently <60 mL/min signify possible Chronic Kidney Disease.    Anion gap 8 5 - 15  CBC     Status: Abnormal   Collection Time: 03/23/17  5:16 AM  Result Value Ref Range   WBC 15.6 (H) 4.0 - 10.5 K/uL   RBC 2.72 (L) 3.87 - 5.11 MIL/uL   Hemoglobin 8.7 (L) 12.0 - 15.0 g/dL   HCT 26.7 (L) 36.0 - 46.0 %   MCV 98.2 78.0 - 100.0 fL   MCH 32.0 26.0 - 34.0 pg   MCHC 32.6 30.0 - 36.0  g/dL   RDW 14.8 11.5 - 15.5 %   Platelets 308 150 - 400 K/uL      Imaging Results (Last 48 hours)  Dg Chest Port 1 View  Result Date: 03/22/2017 CLINICAL DATA:  Follow-up pneumonia. EXAM: PORTABLE CHEST 1 VIEW COMPARISON:  Chest radiograph performed 03/17/2017 FINDINGS: Persistent bibasilar airspace opacity raises concern for pneumonia. No pleural effusion or pneumothorax is seen. The cardiomediastinal silhouette is normal in size. No acute osseous abnormalities are identified. Clips are seen overlying the right axilla. The question of free intra-abdominal air on the prior study is no longer appreciated on this study, and may have been artifactual in nature. IMPRESSION: Persistent bibasilar airspace opacity raises concern for pneumonia. Electronically Signed   By: Garald Balding M.D.   On: 03/22/2017 19:49   Dg Chest Port 1 View  Result Date: 03/22/2017 CLINICAL DATA:  Leukocytosis . EXAM: PORTABLE CHEST 1 VIEW COMPARISON:  03/17/2017 . FINDINGS: Mediastinum hilar structures are stable. Heart size stable. Mild basilar interstitial prominence suggesting mild pneumonitis. Persistent bibasilar atelectasis, particular on the right. No free air in the hemidiaphragm noted on today's exam. IMPRESSION: 1. Mild bibasilar interstitial prominence. Mild pneumonitis cannot be excluded. 2.  Mild bibasilar atelectasis, particular on the right. Electronically Signed   By: Marcello Moores  Register   On: 03/22/2017 10:03     Medical Problem List and Plan: 1.  Functional and mobility deficits secondary to recent left TKA and debility after perforated colon             -admit to inpatient rehab 2.  DVT Prophylaxis/Anticoagulation: Pharmaceutical: Lovenox 3. Pain Management: continue oxycodone prn. 4. Mood: team to provide ego support. LCSW to follow for evaluation and support. Add protein supplement to promote wound healing.  5. Neuropsych: This patient is capable of making decisions on her own behalf.  6.  Skin/Wound Care: routine pressure relief measures. Maintain adequate nutritional and hydration status.              -continue vac             -stoma/ostomy care per Cornlea RN 7. Fluids/Electrolytes/Nutrition:Monitor I/O. Check lytes in am. 8. Leucocytosis/FUO: Monitor CBC serially--Augmentin D #1/10 9. HTN: Monitor BP bid. On benazepril daily. 10.   ABLA:  Will recheck in am. Start iron supplement.   Post Admission Physician Evaluation: 1. Functional deficits secondary  to left TKA/debility. 2. Patient admitted to receive collaborative, interdisciplinary care between the physiatrist, rehab nursing staff, and therapy team. 3. Patient's level of medical complexity and substantial therapy needs in context of that medical necessity cannot be provided at a lesser intensity of care. 4. Patient has experienced substantial functional loss from his/her baseline.  Judging by the patient's diagnosis, physical exam, and functional history, the patient has potential for functional progress which will result in measurable gains while on inpatient rehab.  These gains will be of substantial and practical use upon discharge in facilitating mobility and self-care at the household level. 5. Physiatrist will provide 24 hour management of medical needs as well as oversight of the therapy plan/treatment and provide guidance as appropriate regarding the interaction of the two. 6. 24 hour rehab nursing will assist in the management of  bladder management, bowel management, safety, skin/wound care, disease management, medication administration, pain management and patient education  and help integrate therapy concepts, techniques,education, etc. 7. PT will assess and treat for: DME instruction, Gait training, Stair training, Functional mobility training, Therapeutic activities, Therapeutic exercise, Balance training, Patient/family education.  Goals are: independent with assistive device. 8. OT will assess and treat for  Self-care/ADL training, Therapeutic exercise, DME and/or AE instruction, Therapeutic activities, Cognitive remediation/compensation, Patient/family education, Balance training.  Goals are: independent with assistive device.  9. SLP will assess and treat for  .  Goals are: N/A. 10. Case Management and Social Worker will assess and treat for psychological issues and discharge planning. 11. Team conference will be held weekly to assess progress toward goals and to determine barriers to discharge. 12.  Patient will receive at least 3 hours of therapy per day at least 5 days per week. 13. ELOS and Prognosis: 7-8 days excellent       Meredith Staggers, MD, [facility] 03/23/2017  Reesa Chew, [place] 03/23/2017

## 2017-03-23 NOTE — Progress Notes (Signed)
[facility] Progress Note  6 Days Post-Op  Subjective: CC: Feeling depressed Patient denies abdominal pain, N/V, fevers. Feeling depressed over still being in hospital and wanting to get moving towards discharge. Hungry. UOP good. VSS.  Objective: Vital signs in last 24 hours: Temp:  [98.5 F (36.9 C)-98.9 F (37.2 C)] 98.5 F (36.9 C) (05/25 0428) Pulse Rate:  [97-108] 97 (05/25 0428) Resp:  [18-20] 18 (05/25 0428) BP: (117-133)/(49-65) 117/49 (05/25 0428) SpO2:  [92 %-94 %] 92 % (05/25 0428) Last BM Date: 03/22/17 (colostomy)  Intake/Output from previous day: 05/24 0701 - 05/25 0700 In: 4150 [P.O.:2020; I.V.:1980; IV Piggyback:150] Out: 4403 [Urine:4370; Drains:10; Stool:750] Intake/Output this shift: Total I/O In: 0  Out: 800 [Urine:800]  PE: Gen:  Alert, NAD Card:  Regular rate and rhythm Pulm:  Normal effort, clear to auscultation bilaterally Abd: Soft, non-tender, non-distended, bowel sounds present in all 4 quadrants. Midline incision with VAC in place. Stoma pink, with soft stool output in ostomy pouch Skin: warm and dry, no rashes  Psych: A&Ox3   Lab Results:   Recent Labs  03/21/17 0849 03/23/17 0516  WBC 17.1* 15.6*  HGB 8.6* 8.7*  HCT 26.7* 26.7*  PLT 260 308   BMET  Recent Labs  03/22/17 0432 03/23/17 0516  NA 142 143  K 3.8 4.0  CL 107 105  CO2 28 30  GLUCOSE 109* 102*  BUN 11 9  CREATININE 1.36* 1.13*  CALCIUM 7.9* 8.0*   CMP     Component Value Date/Time   NA 143 03/23/2017 0516   K 4.0 03/23/2017 0516   CL 105 03/23/2017 0516   CO2 30 03/23/2017 0516   GLUCOSE 102 (H) 03/23/2017 0516   BUN 9 03/23/2017 0516   CREATININE 1.13 (H) 03/23/2017 0516   CALCIUM 8.0 (L) 03/23/2017 0516   PROT 7.2 03/16/2017 2347   ALBUMIN 3.3 (L) 03/16/2017 2347   AST 39 03/16/2017 2347   ALT 44 03/16/2017 2347   ALKPHOS 52 03/16/2017 2347   BILITOT 0.9 03/16/2017 2347   GFRNONAA 53 (L) 03/23/2017 0516   GFRAA >60 03/23/2017 0516    Lipase     Component Value Date/Time   LIPASE 16 03/16/2017 2347     Studies/Results: Dg Chest Port 1 View  Result Date: 03/22/2017 CLINICAL DATA:  Follow-up pneumonia. EXAM: PORTABLE CHEST 1 VIEW COMPARISON:  Chest radiograph performed 03/17/2017 FINDINGS: Persistent bibasilar airspace opacity raises concern for pneumonia. No pleural effusion or pneumothorax is seen. The cardiomediastinal silhouette is normal in size. No acute osseous abnormalities are identified. Clips are seen overlying the right axilla. The question of free intra-abdominal air on the prior study is no longer appreciated on this study, and may have been artifactual in nature. IMPRESSION: Persistent bibasilar airspace opacity raises concern for pneumonia. Electronically Signed   By: Garald Balding M.D.   On: 03/22/2017 19:49   Dg Chest Port 1 View  Result Date: 03/22/2017 CLINICAL DATA:  Leukocytosis . EXAM: PORTABLE CHEST 1 VIEW COMPARISON:  03/17/2017 . FINDINGS: Mediastinum hilar structures are stable. Heart size stable. Mild basilar interstitial prominence suggesting mild pneumonitis. Persistent bibasilar atelectasis, particular on the right. No free air in the hemidiaphragm noted on today's exam. IMPRESSION: 1. Mild bibasilar interstitial prominence. Mild pneumonitis cannot be excluded. 2.  Mild bibasilar atelectasis, particular on the right. Electronically Signed   By: Marcello Moores  Register   On: 03/22/2017 10:03    Anti-infectives: Anti-infectives    Start     Dose/Rate Route Frequency  Ordered Stop   03/19/17 2000  vancomycin (VANCOCIN) 1,250 mg in sodium chloride 0.9 % 250 mL IVPB  Status:  Discontinued     1,250 mg 166.7 mL/hr over 90 Minutes Intravenous Every 12 hours 03/19/17 1326 03/22/17 0839   03/17/17 1200  vancomycin (VANCOCIN) IVPB 750 mg/150 ml premix  Status:  Discontinued     750 mg 150 mL/hr over 60 Minutes Intravenous Every 12 hours 03/17/17 0347 03/19/17 1326   03/17/17 1000  piperacillin-tazobactam  (ZOSYN) IVPB 3.375 g     3.375 g 12.5 mL/hr over 240 Minutes Intravenous Every 8 hours 03/17/17 0347     03/17/17 0100  vancomycin (VANCOCIN) 2,000 mg in sodium chloride 0.9 % 500 mL IVPB     2,000 mg 250 mL/hr over 120 Minutes Intravenous  Once 03/17/17 0052 03/17/17 0423   03/17/17 0015  piperacillin-tazobactam (ZOSYN) IVPB 3.375 g     3.375 g 100 mL/hr over 30 Minutes Intravenous  Once 03/17/17 0004 03/17/17 0242   03/17/17 0015  vancomycin (VANCOCIN) IVPB 1000 mg/200 mL premix  Status:  Discontinued     1,000 mg 200 mL/hr over 60 Minutes Intravenous  Once 03/17/17 0004 03/17/17 0220       Assessment/Plan S/P Sigmoid colon resection and creation of colostomy - POD#6 - Hgb and Hct stable - continue SOFT diet after CT - stoma pink with soft stool output - mobilize, IS  Leukocytosis - WBC 15.6 from 17.1 yesterday - afebrile  - UA, CXR negative - US guided PIV and CT to r/o abscess later today - continue IV Zosyn  AKI - creatinine 1.13 from 1.36 yesterday - GFR improved - UOP good - vanc stopped  - continue to hydrate with IVF, will continue to monitor  S/P left knee arthroplasty 5/14 Dr Noemi Chapel - ortho seeing - CPM, therapies  FEN- ice chips until CT then resume SOFT, IVF, calcium low will give tums VTE- lovenox, SCDs ID- Zosyn (5/19>) and Vanc (5/19> 5/24 stopped)  LOS: 6 days    Brigid Re , [facility] Surgery 03/23/2017, 7:53 AM Pager: [phone] Consults: [phone] Mon-Fri 7:00 am-4:30 pm Sat-Sun 7:00 am-11:30 am

## 2017-03-23 NOTE — Progress Notes (Signed)
Allison Gong, RN Rehab Admission Coordinator Signed Physical Medicine and Rehabilitation  PMR Pre-admission Date of Service: 03/22/2017 10:58 AM  Related encounter: ED to Hosp-Admission (Current) from 03/16/2017 in [place]       [] Hide copied text PMR Admission Coordinator Pre-Admission Assessment  Patient: [name] is an 56 y.o., female MRN: [id number] DOB: 03/13/1961 Height: 5' 3"  (160 cm) Weight: 104.8 kg (231 lb)                                                                                                                                                  Insurance Information HMO:     PPO: yes     PCP:      IPA:      80/20:      OTHER:  PRIMARY: [place]      Policy#: [id number]      Subscriber: pt CM Name: [name]      Phone#: [phone]     Fax#: EPIC access Pre-Cert#: [id number]    Approved for 7 days  Employer:  Benefits:  Phone #: [phone]     Name: 03/20/2017 Eff. Date: 07/30/2016     Deduct: $2000      Out of Pocket Max: $4000      Life Max: none CIR: 80%      SNF: 80% 60 days Outpatient: $25 co pay per visit     Co-Pay: 20 visits each of PT, OT, and [place]: 80%      Co-Pay: 60 visits DME: 80%     Co-Pay: 20 % Providers: pt choice  SECONDARY: none       Medicaid Application Date:       Case Manager:  Disability Application Date:       Case Worker:   Emergency Tax adviser Information    Name Relation Home Work [place] Spouse [phone]   No name specified         Current Medical History  Patient Admitting Diagnosis: Left TKR complicated by perforated colon  History of Present Illness:  HPI: Allison G Jonesis a 56 y.o.femalewith history of lymphoma, L-TKR 5/14 who was discharged to home but readmitted on 5/19 with LLQ pain due to perforated diverticulitis.  She underwent sigmoid colectomy with diverting colostomy by  Dr. Georgette Dover. Post op on IV antibiotics and gradual advancement of diet to soft diet. Stoma pink with soft stool output. WBC elevated 5/24 of 17.1 with low grade temp noted 100.6 and then 15.6 today on 03/23/17. UA and CXR negative. US guided PIV and CT scan on 03/23/17 to r/o abscess. On IV Zosyn. CT revealed postoperative changes with left lower quadrant colostomy. Stranding fluid and gas with in the anterior abdominal wall compatible  with recent postoperative change. Small amount of free fluid in the left paracolic gutter. Bilateral renal parapelvic and cortical cysts. Right nephrolithiasis. Trace bilateral pleural effusions. Pt medically cleared to admit to inpt rehab today.  Past Medical History      Past Medical History:  Diagnosis Date  . Anxiety   . Cancer ([place])    hodgkins lymphoma, 1992 Diagnosed  . Hypertension   . Hypothyroidism    "thyroid is dead"  . Primary localized osteoarthritis of left knee   . S/P total knee replacement using cement, left 03/19/2017    Family History  family history includes Breast cancer in her mother; Diabetes in her father.  Prior Rehab/Hospitalizations:  Has the patient had major surgery during 100 days prior to admission? Yes  Current Medications   Current Facility-Administered Medications:  .  0.9 % NaCl with KCl 20 mEq/ L  infusion, , Intravenous, Continuous, Rayburn, Kelly A, PA-C, Last Rate: 100 mL/hr at 03/23/17 0307 .  acetaminophen (TYLENOL) tablet 650 mg, 650 mg, Oral, Q6H PRN, 650 mg at 03/22/17 0431 **OR** acetaminophen (TYLENOL) suppository 650 mg, 650 mg, Rectal, Q6H PRN, Donnie Mesa, MD .  alum & mag hydroxide-simeth (MAALOX/MYLANTA) 200-200-20 MG/5ML suspension 30 mL, 30 mL, Oral, Q6H PRN, Ralene Ok, MD .  amLODipine (NORVASC) tablet 10 mg, 10 mg, Oral, QPM, Donnie Mesa, MD, 10 mg at 03/22/17 1759 .  benazepril (LOTENSIN) tablet 20 mg, 20 mg, Oral, Daily, Donnie Mesa, MD, 20 mg at 03/23/17 1000 .  calcium  carbonate (TUMS - dosed in mg elemental calcium) chewable tablet 800 mg of elemental calcium, 800 mg of elemental calcium, Oral, TID, Rayburn, Kelly A, PA-C .  diphenhydrAMINE (BENADRYL) injection 12.5 mg, 12.5 mg, Intravenous, Q6H PRN **OR** diphenhydrAMINE (BENADRYL) 12.5 MG/5ML elixir 12.5 mg, 12.5 mg, Oral, Q6H PRN, Donnie Mesa, MD .  docusate sodium (COLACE) capsule 100 mg, 100 mg, Oral, BID, Greer Pickerel, MD, 100 mg at 03/23/17 1000 .  enoxaparin (LOVENOX) injection 50 mg, 50 mg, Subcutaneous, Q24H, Reginia Naas, RPH, 50 mg at 03/23/17 0805 .  hydrALAZINE (APRESOLINE) injection 10 mg, 10 mg, Intravenous, Q2H PRN, Donnie Mesa, MD .  HYDROmorphone (DILAUDID) injection 1 mg, 1 mg, Intravenous, Q2H PRN, Rayburn, Kelly A, PA-C .  iopamidol (ISOVUE-300) 61 % injection, , , ,  .  levothyroxine (SYNTHROID, LEVOTHROID) tablet 88 mcg, 88 mcg, Oral, QAC breakfast, Donnie Mesa, MD, 88 mcg at 03/23/17 0806 .  loratadine (CLARITIN) tablet 10 mg, 10 mg, Oral, Daily, Donnie Mesa, MD, 10 mg at 03/23/17 1000 .  methocarbamol (ROBAXIN) tablet 500 mg, 500 mg, Oral, Q6H PRN, Donnie Mesa, MD, 500 mg at 03/21/17 2139 .  naloxone [facility]) injection 0.4 mg, 0.4 mg, Intravenous, PRN **AND** sodium chloride flush (NS) 0.9 % injection 9 mL, 9 mL, Intravenous, PRN, Donnie Mesa, MD .  ondansetron (ZOFRAN-ODT) disintegrating tablet 4 mg, 4 mg, Oral, Q6H PRN **OR** ondansetron (ZOFRAN) injection 4 mg, 4 mg, Intravenous, Q6H PRN, Donnie Mesa, MD .  oxyCODONE (Oxy IR/ROXICODONE) immediate release tablet 5-10 mg, 5-10 mg, Oral, Q4H PRN, Rayburn, Kelly A, PA-C, 10 mg at 03/21/17 2140 .  piperacillin-tazobactam (ZOSYN) IVPB 3.375 g, 3.375 g, Intravenous, Q8H, Franky Macho, RPH, Stopped at 03/23/17 1000 .  pravastatin (PRAVACHOL) tablet 20 mg, 20 mg, Oral, QPM, Donnie Mesa, MD, 20 mg at 03/22/17 1759 .  sodium chloride flush (NS) 0.9 % injection 10-40 mL, 10-40 mL, Intracatheter, Q12H, Bensimhon,  Daniel R, MD .  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 None of the above  Prior Functional Level Prior Function Level of Independence: Independent  Self Care: Did the patient need help bathing, dressing, using the toilet or eating?  Independent  Indoor Mobility: Did the patient need assistance with walking from room to room (with or without device)? Independent  Stairs: Did the patient need assistance with internal or external stairs (with or without device)? Independent  Functional Cognition: Did the patient need help planning regular tasks such as shopping or remembering to take medications? Independent  Current Functional Level Cognition  Overall Cognitive Status: Within Functional Limits for tasks assessed Orientation Level: Oriented X4    Extremity Assessment (includes Sensation/Coordination)  Upper Extremity Assessment: Generalized weakness  Lower Extremity Assessment: Defer to PT evaluation LLE Deficits / Details: Grossly decr AROM and strength; range approx 5-65 deg LLE: Unable to fully assess due to pain    ADLs  Overall ADL's : Needs assistance/impaired Eating/Feeding: Modified independent, Bed level Grooming: Wash/dry hands, Wash/dry face, Set up, Bed level Upper Body Bathing: Maximal assistance, Bed level Lower Body Bathing: Total assistance, Bed level Lower Body Bathing Details (indicate cue type and reason):  daughter (A) on arrival Toilet Transfer: Minimal assistance, +2 for physical assistance Toilet Transfer Details (indicate cue type and reason): needs (A) to power up and max cues for hand placement Toileting - Clothing Manipulation Details (indicate cue type and reason): unable to void bladder but reports the urge Functional mobility during ADLs: Moderate assistance, Rolling walker General ADL Comments: Pt able to power up better today and more alert. Pt with PCA button d/c this session and much more alert    Mobility  Overal bed mobility: Needs Assistance Bed Mobility: Supine to Sit Rolling: Max assist Sidelying to sit: Mod assist Supine to sit: Max assist General bed mobility comments: Up in recliner at entry.     Transfers  Overall transfer level: Needs assistance Equipment used: Rolling walker (2 wheeled) Transfers: Sit to/from Stand Sit to Stand: +2 physical assistance, Min assist Stand pivot transfers: +2 physical assistance, Min assist General transfer comment: minAx2 for powerup and steadying in RW    Ambulation / Gait / Stairs / Wheelchair Mobility  Ambulation/Gait Ambulation/Gait assistance: Min assist, +2 safety/equipment (Chair to follow) Ambulation Distance (Feet): 50 Feet Assistive device: Rolling walker (2 wheeled) Gait Pattern/deviations: Step-through pattern, Trunk flexed, Decreased step length - right, Decreased step length - left (decreased knee flexion on L) General Gait Details: vc for upright posture, increased heel strike and leg extension at toe off. Gait velocity: slowed Gait velocity interpretation: Below normal speed for age/gender    Posture / Balance Balance Overall balance assessment: Needs assistance Sitting-balance support: No upper extremity supported, Feet supported Sitting balance-Leahy Scale: Good Standing balance support: During functional activity, No upper extremity supported Standing balance-Leahy Scale: Poor (reliant on RW for  support) Standing balance comment: requires RW for support    Special needs/care consideration BiPAP/CPAP  N/a CPM  Yes; Left CPM 6 hrs per  day; 2 hrs in am, 2 hrs in afternoon and 2 hrs in evening; 0- 90 degrees Continuous Drip IV  PICC placed 03/22/2017 Dialysis  N/a Life Vest  N/a Oxygen  N/a Special Bed   N/a Trach Size  N/a Wound Vac (area) yes; see below for WOC assessments. Monday, Wednesday and Friday changes Skin colostomy; left knee surgical incision, abdominal wound with wound VAC Melody R, RN Registered Nurse Signed WOC  Consult Note Date of Service: 03/20/2017 1:09 PM      [] Hide copied text [] Hover for attribution information WOC Nurse ostomy consultnote Stoma type/location: LLQ, end colostomy Stomal assessment/size: oval shaped, the pouch is intact today Peristomal assessment: NA Treatment options for stomal/peristomal skin: using barrier ring and convexity to aid in seal with abdominal contour issues. Output serous, ostomy sweat, +flatus Ostomy pouching: 1pc.flexible convex Education provided: met with patient and her daughter today, pouch intact from yesterday. Answered questions about supplies, will mark [employer] ostomy catalog for them once we see if the flexible convex is going to work for her.  Enrolled patient in [place] program: No, permission card signed holding off until we evaluate the effectiveness of the current pouching situation.  Will plan to change pouch again in the am with NPWT VAC dressing change.   Melody [facility], CNS [phone]      Nadara Mode, RN Registered Nurse Signed WOC  Consult Note Date of Service: 03/19/2017 10:20 AM      [] Hide copied text [] Hover for attribution information [facility] Nurse wound consult note Reason for Consult: NPWT dressing change, first post op dressing Wound type: surgical  Pressure Injury POA: No Measurement:21cm x 4.5cm x 3.5cm  Wound CBS:WHQPR, subcutaneous  tissue, moist Drainage (amount, consistency, odor) minimal in canister, some pooling of serosanginous drainage in the base of the distal edge of the wound bed. Periwound: intact Dressing procedure/placement/frequency: 1pc of black foam used to fill the wound bed, sealed at 156mHG. Patient tolerated well, used PCA pump 1x during dressing change.  WWhitakerNurse ostomy consultnote Stoma type/location: LLQ, end colostomy Stomal assessment/size: 1" x 1 3/4" oval shaped, in an abdominal skin fold, slightly budded Peristomal assessment: intact, one area of MARSI (medical adhesive skin injury noted at 2 oclock at the distal edge of the tape border of the ostomy wafer. Blister intact  Treatment options for stomal/peristomal skin: liquid skin barrier used on blistered area Output none Ostomy pouching: 1pc convex flexible used today, 2" barrier ring cut in 1/2 and each half used around the stoma at 3 and 9 oclock to flatted dip in abdominal contour.  Education provided: Husband engaged to learn, provided wGaffer He observed pouch change today. Patient is quite sleepy from PCA pump use from dressing change and from dose she self administered during pouch change.  [place], [place] permission card signed, will wait until I evaluate use of flex convex vs. Use of flat convex before I send samples.  WIbervilleNurse will follow along with you for continued support with ostomy teaching and care and NPWT dressing changes, due to the proximity of the dressing and the ostomy wafer.  Melody ADecatur County HospitalMSN, RN, [phone]            Bowel mgmt:  Continent LBM * Bladder mgmt:  continent Diabetic mgmt  N/a Colostomy new this admission   Previous Home Environment Living Arrangements: Spouse/significant other  Lives With: Spouse Available Help at Discharge:  (  spouse may take FMLA for when pt returns home.) Type of Home:  House Home Layout: One level Home Access: Stairs to enter Entrance Stairs-Rails: None Entrance Stairs-Number of Steps: 3 Bathroom Shower/Tub: Public librarian, Industrial/product designer: Yes How Accessible: Accessible via walker Additional Comments: Homehealth prior to admission for L TKA  [facility] ordered after TKR  Discharge Living Setting Plans for Discharge Living Setting: Patient's home, Lives with (comment) (spouse) Type of Home at Discharge: House Discharge Home Layout: One level Discharge Home Access: Stairs to enter Entrance Stairs-Rails: None Entrance Stairs-Number of Steps: 3 Discharge Bathroom Shower/Tub: Tub/shower unit, Curtain Discharge Bathroom Toilet: Standard Discharge Bathroom Accessibility: Yes How Accessible: Accessible via walker Does the patient have any problems obtaining your medications?: No  Social/Family/Support Systems Patient Roles: Spouse, Parent, Other (Comment) (employee) Contact Information: [place], spouse Anticipated Caregiver: spouse, daughter and sister Anticipated Ambulance person Information: see above Ability/Limitations of Caregiver: spouse and daughter work; spouse may take some FMLA Caregiver Availability: Other (Comment) Discharge Plan Discussed with Primary Caregiver: Yes Is Caregiver In Agreement with Plan?: Yes Does Caregiver/Family have Issues with Lodging/Transportation while Pt is in Rehab?: No  Goals/Additional Needs Patient/Family Goal for Rehab: Mod I to supervision with PT and OT Expected length of stay: ELOS 8-13 days Equipment Needs: Wond VAC; new colostomy this admission Pt/Family Agrees to Admission and willing to participate: Yes Program Orientation Provided & Reviewed with Pt/Caregiver Including Roles  & Responsibilities: Yes  Decrease burden of Care through IP rehab admission: n/a  Possible need for SNF placement upon discharge: not anticipated  Patient Condition: This  patient's medical and functional status has changed since the consult dated: 03/19/2017 in which the Rehabilitation Physician determined and documented that the patient's condition is appropriate for intensive rehabilitative care in an inpatient rehabilitation facility. See "History of Present Illness" (above) for medical update. Functional changes are: overall min to mod assist. Patient's medical and functional status update has been discussed with the Rehabilitation physician and patient remains appropriate for inpatient rehabilitation. Will admit to inpatient rehab today.  Preadmission Screen Completed By:  Cleatrice Burke, 03/23/2017 12:53 PM ______________________________________________________________________   Discussed status with Dr. Naaman Plummer on 03/23/2017 at 1252 and received telephone approval for admission today.  Admission Coordinator:  Cleatrice Burke, time 9357 Date 03/23/2017       Cosigned by: Meredith Staggers, MD at 03/23/2017 12:59 PM  Revision History

## 2017-03-24 ENCOUNTER — Inpatient Hospital Stay (HOSPITAL_COMMUNITY): Payer: 59

## 2017-03-24 ENCOUNTER — Inpatient Hospital Stay (HOSPITAL_COMMUNITY): Payer: 59 | Admitting: Physical Therapy

## 2017-03-24 DIAGNOSIS — K631 Perforation of intestine (nontraumatic): Secondary | ICD-10-CM

## 2017-03-24 DIAGNOSIS — I10 Essential (primary) hypertension: Secondary | ICD-10-CM

## 2017-03-24 LAB — CBC WITH DIFFERENTIAL/PLATELET
BASOS PCT: 0 %
Basophils Absolute: 0 10*3/uL (ref 0.0–0.1)
EOS PCT: 1 %
Eosinophils Absolute: 0.1 10*3/uL (ref 0.0–0.7)
HEMATOCRIT: 27.4 % — AB (ref 36.0–46.0)
Hemoglobin: 8.7 g/dL — ABNORMAL LOW (ref 12.0–15.0)
LYMPHS ABS: 1.5 10*3/uL (ref 0.7–4.0)
Lymphocytes Relative: 10 %
MCH: 31 pg (ref 26.0–34.0)
MCHC: 31.8 g/dL (ref 30.0–36.0)
MCV: 97.5 fL (ref 78.0–100.0)
MONO ABS: 0.4 10*3/uL (ref 0.1–1.0)
Monocytes Relative: 3 %
Neutro Abs: 12.8 10*3/uL — ABNORMAL HIGH (ref 1.7–7.7)
Neutrophils Relative %: 86 %
Platelets: 349 10*3/uL (ref 150–400)
RBC: 2.81 MIL/uL — AB (ref 3.87–5.11)
RDW: 14.6 % (ref 11.5–15.5)
WBC: 14.8 10*3/uL — AB (ref 4.0–10.5)

## 2017-03-24 LAB — COMPREHENSIVE METABOLIC PANEL
ALT: 33 U/L (ref 14–54)
ANION GAP: 9 (ref 5–15)
AST: 25 U/L (ref 15–41)
Albumin: 1.8 g/dL — ABNORMAL LOW (ref 3.5–5.0)
Alkaline Phosphatase: 64 U/L (ref 38–126)
BILIRUBIN TOTAL: 0.4 mg/dL (ref 0.3–1.2)
BUN: 11 mg/dL (ref 6–20)
CO2: 27 mmol/L (ref 22–32)
Calcium: 8.3 mg/dL — ABNORMAL LOW (ref 8.9–10.3)
Chloride: 105 mmol/L (ref 101–111)
Creatinine, Ser: 1.09 mg/dL — ABNORMAL HIGH (ref 0.44–1.00)
GFR, EST NON AFRICAN AMERICAN: 55 mL/min — AB (ref >60)
Glucose, Bld: 109 mg/dL — ABNORMAL HIGH (ref 65–99)
POTASSIUM: 3.5 mmol/L (ref 3.5–5.1)
Sodium: 141 mmol/L (ref 135–145)
TOTAL PROTEIN: 5.8 g/dL — AB (ref 6.5–8.1)

## 2017-03-24 NOTE — Progress Notes (Signed)
Physical Therapy Session Note  Patient Details  Name: [name] MRN: [id number] Date of Birth: August 02, 1961  Today's Date: 03/24/2017 PT Individual Time: 1345-1430 PT Individual Time Calculation (min): 45 min   Short Term Goals: Week 1:  PT Short Term Goal 1 (Week 1): =LTG due to estimated LOS  Skilled Therapeutic Interventions/Progress Updates:  Pt received seated in recliner, denies pain and agreeable to treatment. Pt requesting to empty colostomy bag, does not know how to do it herself. Emptied with assistance from NT. Pt demo'ed LLE SLR with <10 degree lag, knee immobilizer not donned for this session; discussed decision with RN who is in agreement. Gait to/from gym with RW and close S. Performed nustep x8 min total. Knee flexion ROM assessed 75 degrees. Heel slides, quad sets, short arc quad performed BLE in supine. Pt with difficulty performing quad set despite multimodal cueing. Returned to room as above. Remained seated in recliner at end of session, all needs in reach.   Therapy Documentation Precautions:  Precautions Precautions: Knee, Fall Required Braces or Orthoses: Other Brace/Splint, Knee Immobilizer - Left Knee Immobilizer - Left: On when out of bed or walking Restrictions Weight Bearing Restrictions: No LLE Weight Bearing: Weight bearing as tolerated   See Function Navigator for Current Functional Status.   Therapy/Group: Individual Therapy  [name] 03/24/2017, 2:48 PM

## 2017-03-24 NOTE — Progress Notes (Signed)
Occupational Therapy Session Note  Patient Details  Name: [name] MRN: [id number] Date of Birth: 1960-12-14  Today's Date: 03/24/2017 OT Individual Time: 0700-0758 OT Individual Time Calculation (min): 58 min    Short Term Goals: Week 1:  OT Short Term Goal 1 (Week 1): STG = LTG d/t ELOS  Skilled Therapeutic Interventions/Progress Updates:    1:1. Focus of session on tub transfer, functional ambulation, and knee ROM. Pt ambulates with KI and RW with supervision and VC for RW management/safety awarenss to/from therpeutic destinations. Pt transfers onto TTB with VC for sequencing. In gym pt completes 5x20 knee flexion using maxi slides to decrease friction. Exited session with pt seated in recliner with call light in reach and husband present.   Therapy Documentation Precautions:  Precautions Precautions: Knee, Fall Required Braces or Orthoses: Other Brace/Splint, Knee Immobilizer - Left Knee Immobilizer - Left: On when out of bed or walking Restrictions Weight Bearing Restrictions: No LLE Weight Bearing: Weight bearing as tolerated  Praxis: Intact  See Function Navigator for Current Functional Status.   Therapy/Group: Individual Therapy  Tonny Branch 03/24/2017, 4:04 PM

## 2017-03-24 NOTE — Evaluation (Signed)
Occupational Therapy Assessment and Plan  Patient Details  Name: [name] MRN: [id number] Date of Birth: [birth year]  OT Diagnosis: abnormal posture, acute pain and muscle weakness (generalized) Rehab Potential:   ELOS: 7-10   Today's Date: 03/24/2017 OT Individual Time: 1093-2355 OT Individual Time Calculation (min): 58 min     Problem List:  Patient Active Problem List   Diagnosis Date Noted  . Perforated bowel ([place]) 03/23/2017  . S/P total knee replacement using cement, left 03/12/2017 by Dr Noemi Chapel 03/19/2017  . Perforated diverticulum   . History of lymphoma   . Hyperglycemia   . Post-op pain   . Perforation of sigmoid colon due to diverticulitis 03/17/2017  . Primary localized osteoarthritis of left knee   . Hypothyroidism   . Hypertension   . Cancer ([place])   . OSTEOARTHRITIS, KNEE, RIGHT 03/30/2010    Past Medical History:  Past Medical History:  Diagnosis Date  . Anxiety   . Cancer ([place])    hodgkins lymphoma, 1992 Diagnosed  . Hypertension   . Hypothyroidism    "thyroid is dead"  . Primary localized osteoarthritis of left knee   . S/P total knee replacement using cement, left 03/19/2017   Past Surgical History:  Past Surgical History:  Procedure Laterality Date  . AXILLARY LYMPH NODE DISSECTION Right 1992  . BONE MARROW TRANSPLANT    . COLON RESECTION SIGMOID N/A 03/17/2017   Procedure: COLON RESECTION SIGMOID AND CREATION OF COLOSTOMY;  Surgeon: Donnie Mesa, MD;  Location: [facility];  Service: General;  Laterality: N/A;  . COLONOSCOPY N/A 09/07/2015   Procedure: COLONOSCOPY;  Surgeon: Aviva Signs, MD;  [facility];  Service: Gastroenterology;  Laterality: N/A;  . ECTOPIC PREGNANCY SURGERY  1990  . TONSILLECTOMY  1965  . TOTAL KNEE ARTHROPLASTY Left 03/12/2017   Procedure: LEFT TOTAL KNEE ARTHROPLASTY;  Surgeon: Elsie Saas, MD;  Location: [facility];  Service: Orthopedics;  Laterality: Left;    Assessment & Plan Clinical Impression: [name] a 56 y.o.femalewith history of lymphoma, L-TKR 5/14 who was discharged to home but readmitted on 5/19 with LLQ pain due to perforated diverticulitis. History taken from chart review and husband. She underwent sigmoid colectomy with diverting colostomy by Dr. Georgette Dover. Post op on IV antibiotics and diet has slowly been advanced to soft. Wound VAC in place and being changed MWF by [facility]. She has had persistent leucocytosis and spiked temp 101 on 5/22 therefore started on Vanc/Zosyn. She has defervesced and antibiotic narrowed to Augmentin X 10 days today. She is currently limited by pain with inability with deficits in mobility. CIR recommended for follow up therapy. .    Patient currently requires min with basic self-care skills secondary to muscle weakness, decreased cardiorespiratoy endurance and decreased safety awareness.  Prior to hospitalization, patient could complete BADL/IADL with independent .  Patient will benefit from skilled intervention to increase independence with basic self-care skills prior to discharge home with care partner.  Anticipate patient will require intermittent supervision and follow up home health.  OT - End of Session Endurance Deficit: Yes OT Assessment OT Patient demonstrates impairments in the following area(s): Balance;Endurance;Motor;Pain;Safety OT Basic ADL's Functional Problem(s): Grooming;Dressing;Bathing;Toileting OT Advanced ADL's Functional Problem(s): Simple Meal Preparation OT Transfers Functional Problem(s): Toilet;Tub/Shower OT Plan OT Intensity: Minimum of 1-2 x/day, 45 to 90 minutes OT Frequency: 5 out of 7 days OT Duration/Estimated Length of Stay: 7-10 OT Treatment/Interventions: Balance/vestibular training;Community reintegration;Discharge planning;DME/adaptive equipment instruction;Functional mobility training;Pain management;Patient/family education;Psychosocial support;Self Care/advanced ADL retraining;Skin  care/wound managment;Therapeutic  Activities;Therapeutic Exercise;UE/LE Strength taining/ROM;UE/LE Coordination activities OT Self Feeding Anticipated Outcome(s): MOD I OT Basic Self-Care Anticipated Outcome(s): MOD I OT Toileting Anticipated Outcome(s): MOD I OT Bathroom Transfers Anticipated Outcome(s): MOD I OT Recommendation Patient destination: Home Follow Up Recommendations: Home health OT Equipment Recommended: Tub/shower bench;To be determined Equipment Details: has [facility]   Skilled Therapeutic Intervention 1:1 session focused on BADL retraining with pt spouse present. No report of pain. Ot provide A to don KI in bed. Pt stand piot transfer to standard chair with min A and VC for safety awareness and sequencing. Pt bathes sit to stand level at sink with A to wash buttocks thoroughly and touching A for balance. Pt dons pull over shirt with supervision and pants with touching A to advance pants over hips and Vc for posture. Ot demonstrate sock aide to don L sock and pt teach back technique. Pt standing with supervision at sink to brush teeth with VC for hip extension. Exited session with pt seated in w/c and call light in reach.   OT Evaluation Precautions/Restrictions  Precautions Precautions: Knee;Fall Required Braces or Orthoses: Other Brace/Splint;Knee Immobilizer - Left Knee Immobilizer - Left: On when out of bed or walking General Chart Reviewed: Yes Vital Signs Therapy Vitals Temp: 98.2 F (36.8 C) Temp Source: Oral Pulse Rate: 92 Resp: 16 BP: 136/61 Patient Position (if appropriate): Lying Oxygen Therapy SpO2: 92 % O2 Device: Not Delivered Pain Pain Assessment Pain Assessment: No/denies pain Home Living/Prior Functioning Home Living Available Help at Discharge: Family Type of Home: House Home Access: Stairs to enter Technical brewer of Steps: 3 Entrance Stairs-Rails: None Home Layout: One level Bathroom Shower/Tub: Tub/shower unit, Theatre stage manager Accessibility: Yes Additional  Comments: Homehealth prior to admission for L TKA  Lives With: Spouse IADL History Homemaking Responsibilities: Yes Meal Prep Responsibility: Primary Laundry Responsibility: Primary Cleaning Responsibility: Primary Bill Paying/Finance Responsibility: Primary Shopping Responsibility: Secondary Child Care Responsibility: No Current License: Yes Mode of Transportation: Car Occupation: Full time employment Type of Occupation: working at daycare Leisure and Hobbies: reading- Copy ADL   Vision Patient Visual Report: No change from baseline Vision Assessment?: No apparent visual deficits Perception  Perception: Within Functional Limits Praxis Praxis: Intact Cognition Overall Cognitive Status: Within Functional Limits for tasks assessed Arousal/Alertness: Awake/alert Year: 2018 Month: May Day of Week: Correct Memory: Appears intact Immediate Memory Recall: Sock;Blue;Bed Memory Recall: (P) Blue;Bed Memory Recall Blue: (P) Without Cue Memory Recall Bed: (P) Without Cue Sensation Sensation Light Touch: Appears Intact Coordination Gross Motor Movements are Fluid and Coordinated: Yes Fine Motor Movements are Fluid and Coordinated: Yes Motor  Motor Motor: Within Functional Limits Mobility  Transfers Transfers: Sit to Stand;Stand to Sit Sit to Stand: 4: Min guard Stand to Sit: 4: Min guard  Trunk/Postural Assessment  Cervical Assessment Cervical Assessment: Within Functional Limits Thoracic Assessment Thoracic Assessment: Within Functional Limits Lumbar Assessment Lumbar Assessment: Within Functional Limits Postural Control Postural Control: Within Functional Limits  Balance Balance Balance Assessed: Yes Dynamic Sitting Balance Dynamic Sitting - Balance Support: Feet supported Dynamic Sitting - Level of Assistance: 6: Modified independent (Device/Increase time) Sitting balance - Comments: dressing Dynamic Standing Balance Dynamic Standing - Level of  Assistance: 4: Min assist Dynamic Standing - Comments: dresing Extremity/Trunk Assessment RUE Assessment RUE Assessment: Within Functional Limits LUE Assessment LUE Assessment: Within Functional Limits   See Function Navigator for Current Functional Status.   Refer to Care Plan for Long Term Goals  Recommendations for other services: None  Discharge Criteria: Patient will be discharged from OT if patient refuses treatment 3 consecutive times without medical reason, if treatment goals not met, if there is a change in medical status, if patient makes no progress towards goals or if patient is discharged from hospital.  The above assessment, treatment plan, treatment alternatives and goals were discussed and mutually agreed upon: by patient and by family  Tonny Branch 03/24/2017, 8:00 AM

## 2017-03-24 NOTE — Progress Notes (Signed)
[name] is a 56 y.o. female [birth year] [id number]  Subjective: Passed a small stool via rectum x1. No other new problems. Slept well. Feeling OK.  Objective: Vital signs in last 24 hours: Temp:  [98.2 F (36.8 C)-98.9 F (37.2 C)] 98.2 F (36.8 C) (05/26 0513) Pulse Rate:  [92-102] 92 (05/26 0513) Resp:  [16-18] 16 (05/26 0513) BP: (109-136)/(52-61) 136/61 (05/26 0513) SpO2:  [92 %-95 %] 92 % (05/26 0513) Weight change:     Intake/Output from previous day: 05/25 0701 - 05/26 0700 In: 240 [P.O.:240] Out: 600 [Stool:600] Last cbgs: CBG (last 3)  No results for input(s): GLUCAP in the last 72 hours.   Physical Exam General: No apparent distress   HEENT: not dry Lungs: Normal effort. Lungs clear to auscultation, no crackles or wheezes. Cardiovascular: Regular rate and rhythm, no edema Abdomen: S/NT/ND; BS(+) Musculoskeletal:  unchanged Neurological: No new neurological deficits Wounds: N/A    Skin: clear. Colostomy bag on the L. RLE in a brace Mental state: Alert, oriented, cooperative    Lab Results: BMET    Component Value Date/Time   NA 141 03/24/2017 0420   K 3.5 03/24/2017 0420   CL 105 03/24/2017 0420   CO2 27 03/24/2017 0420   GLUCOSE 109 (H) 03/24/2017 0420   BUN 11 03/24/2017 0420   CREATININE 1.09 (H) 03/24/2017 0420   CALCIUM 8.3 (L) 03/24/2017 0420   GFRNONAA 55 (L) 03/24/2017 0420   GFRAA >60 03/24/2017 0420   CBC    Component Value Date/Time   WBC 14.8 (H) 03/24/2017 0420   RBC 2.81 (L) 03/24/2017 0420   HGB 8.7 (L) 03/24/2017 0420   HCT 27.4 (L) 03/24/2017 0420   PLT 349 03/24/2017 0420   MCV 97.5 03/24/2017 0420   MCH 31.0 03/24/2017 0420   MCHC 31.8 03/24/2017 0420   RDW 14.6 03/24/2017 0420   LYMPHSABS 1.5 03/24/2017 0420   MONOABS 0.4 03/24/2017 0420   EOSABS 0.1 03/24/2017 0420   BASOSABS 0.0 03/24/2017 0420    Studies/Results: Ct Abdomen Pelvis W Contrast  Result Date: 03/23/2017 CLINICAL DATA:  Leukocytosis EXAM: CT  ABDOMEN AND PELVIS WITH CONTRAST TECHNIQUE: Multidetector CT imaging of the abdomen and pelvis was performed using the standard protocol following bolus administration of intravenous contrast. CONTRAST:  17mL ISOVUE-300 IOPAMIDOL (ISOVUE-300) INJECTION 61% COMPARISON:  03/17/2017 FINDINGS: Lower chest: Small bilateral pleural effusions. Dependent atelectasis. Heart is normal size. Hepatobiliary: No focal hepatic abnormality. Gallbladder unremarkable. Pancreas: No focal abnormality or ductal dilatation. Spleen: No focal abnormality.  Normal size. Adrenals/Urinary Tract: Large parapelvic cyst in the right kidney centrally. Smaller parapelvic cysts on the left. No hydronephrosis. Small scattered cortical cysts. Adrenal glands and urinary bladder are unremarkable. Punctate nonobstructing stones in the mid and upper pole of the right kidney. Stomach/Bowel: Postoperative changes in the colon with Hartmann's pouch and left lower quadrant colostomy. No evidence of bowel obstruction. Stomach and small bowel decompressed, unremarkable. Vascular/Lymphatic: Diffuse aortic and iliac calcifications. No aneurysm or adenopathy. Calcified right aortocaval lymph nodes, stable. Reproductive: Uterus and adnexa unremarkable.  No mass. Other: Postoperative changes with fluid, stranding and gas locules in the anterior abdominal wall in the midline. Small amount of free fluid in the left paracolic gutter. No free air. Musculoskeletal: No acute bony abnormality. IMPRESSION: Postoperative changes with left lower quadrant colostomy. Stranding, fluid and gas within the anterior abdominal wall compatible with recent postoperative change. Small amount of free fluid in the left paracolic gutter. Bilateral renal parapelvic and cortical cysts.  No hydronephrosis. Right nephrolithiasis. Trace bilateral pleural effusions. Electronically Signed   By: Rolm Baptise M.D.   On: 03/23/2017 10:57   Dg Chest Port 1 View  Result Date: 03/22/2017 CLINICAL  DATA:  Follow-up pneumonia. EXAM: PORTABLE CHEST 1 VIEW COMPARISON:  Chest radiograph performed 03/17/2017 FINDINGS: Persistent bibasilar airspace opacity raises concern for pneumonia. No pleural effusion or pneumothorax is seen. The cardiomediastinal silhouette is normal in size. No acute osseous abnormalities are identified. Clips are seen overlying the right axilla. The question of free intra-abdominal air on the prior study is no longer appreciated on this study, and may have been artifactual in nature. IMPRESSION: Persistent bibasilar airspace opacity raises concern for pneumonia. Electronically Signed   By: Garald Balding M.D.   On: 03/22/2017 19:49   Dg Chest Port 1 View  Result Date: 03/22/2017 CLINICAL DATA:  Leukocytosis . EXAM: PORTABLE CHEST 1 VIEW COMPARISON:  03/17/2017 . FINDINGS: Mediastinum hilar structures are stable. Heart size stable. Mild basilar interstitial prominence suggesting mild pneumonitis. Persistent bibasilar atelectasis, particular on the right. No free air in the hemidiaphragm noted on today's exam. IMPRESSION: 1. Mild bibasilar interstitial prominence. Mild pneumonitis cannot be excluded. 2.  Mild bibasilar atelectasis, particular on the right. Electronically Signed   By: Marcello Moores  Register   On: 03/22/2017 10:03    Medications: I have reviewed the patient's current medications.  Assessment/Plan:  1. S/p R TKR - in Rehab 2. S/p L colectomy/colostomy 3. Pain - Oxycodone prn 4. Fluids - d/c IV 5. HTN - Benazepril 6. FUO - on Augmentin      Length of stay, days: 1  Walker Kehr , MD 03/24/2017, 9:34 AM

## 2017-03-24 NOTE — Evaluation (Signed)
Physical Therapy Assessment and Plan  Patient Details  Name: [name] MRN: [id number] Date of Birth: April 12, 1961  PT Diagnosis: Abnormality of gait, Difficulty walking, Muscle weakness and Pain in LLE Rehab Potential: Good ELOS: 7-10 days   Today's Date: 03/24/2017 PT Individual Time: 2094-7096 PT Individual Time Calculation (min): 60 min    Problem List:  Patient Active Problem List   Diagnosis Date Noted  . Perforated bowel ([place]) 03/23/2017  . S/P total knee replacement using cement, left 03/12/2017 by Dr Noemi Chapel 03/19/2017  . Perforated diverticulum   . History of lymphoma   . Hyperglycemia   . Post-op pain   . Perforation of sigmoid colon due to diverticulitis 03/17/2017  . Primary localized osteoarthritis of left knee   . Hypothyroidism   . Hypertension   . Cancer ([place])   . OSTEOARTHRITIS, KNEE, RIGHT 03/30/2010    Past Medical History:  Past Medical History:  Diagnosis Date  . Anxiety   . Cancer ([place])    hodgkins lymphoma, 1992 Diagnosed  . Hypertension   . Hypothyroidism    "thyroid is dead"  . Primary localized osteoarthritis of left knee   . S/P total knee replacement using cement, left 03/19/2017   Past Surgical History:  Past Surgical History:  Procedure Laterality Date  . AXILLARY LYMPH NODE DISSECTION Right 1992  . BONE MARROW TRANSPLANT    . COLON RESECTION SIGMOID N/A 03/17/2017   Procedure: COLON RESECTION SIGMOID AND CREATION OF COLOSTOMY;  Surgeon: Donnie Mesa, MD;  Location: [facility];  Service: General;  Laterality: N/A;  . COLONOSCOPY N/A 09/07/2015   Procedure: COLONOSCOPY;  Surgeon: Aviva Signs, MD;  [facility];  Service: Gastroenterology;  Laterality: N/A;  . ECTOPIC PREGNANCY SURGERY  1990  . TONSILLECTOMY  1965  . TOTAL KNEE ARTHROPLASTY Left 03/12/2017   Procedure: LEFT TOTAL KNEE ARTHROPLASTY;  Surgeon: Elsie Saas, MD;  Location: [facility];  Service: Orthopedics;  Laterality: Left;    Assessment & Plan Clinical Impression:  [name] a 56 y.o.femalewith history of lymphoma, L-TKR 5/14 who was discharged to home but readmitted on 5/19 with LLQ pain due to perforated diverticulitis. She underwent sigmoid colectomy with diverting colostomy by Dr. Georgette Dover. Post op on IV antibiotics and gradual advancement of diet to soft diet. Stoma pink with soft stool output. WBC elevated 5/24 of 17.1 with low grade temp noted 100.6 and then 15.6 today on 03/23/17. UA and CXR negative. US guided PIV and CT scan on 03/23/17 to r/o abscess. On IV Zosyn. CT revealed postoperative changes with left lower quadrant colostomy. Stranding fluid and gas with in the anterior abdominal wall compatible with recent postoperative change. Small amount of free fluid in the left paracolic gutter. Bilateral renal parapelvic and cortical cysts. Right nephrolithiasis. Trace bilateral pleural effusions. Pt medically cleared to admit to inpt rehab today.  Patient transferred to CIR on 03/23/2017 .   Patient currently requires min with mobility secondary to muscle weakness and muscle joint tightness, decreased cardiorespiratoy endurance and decreased standing balance, decreased postural control and decreased balance strategies.  Prior to hospitalization, patient was independent  with mobility and lived with Spouse in a House home.  Home access is 3Stairs to enter.  Patient will benefit from skilled PT intervention to maximize safe functional mobility, minimize fall risk and decrease caregiver burden for planned discharge home with 24 hour supervision.  Anticipate patient will benefit from follow up [place] at discharge.  PT - End of Session Activity Tolerance: Tolerates  30+ min activity with multiple rests Endurance Deficit: Yes Endurance Deficit Description: requires rest breaks after short duration mobility tasks PT Assessment Rehab Potential (ACUTE/IP ONLY): Good Barriers to Discharge: Inaccessible home environment;Decreased caregiver support PT Patient  demonstrates impairments in the following area(s): Balance;Endurance;Pain;Safety PT Transfers Functional Problem(s): Bed Mobility;Bed to Chair;Car;Furniture PT Locomotion Functional Problem(s): Ambulation;Stairs PT Plan PT Intensity: Minimum of 1-2 x/day ,45 to 90 minutes PT Frequency: 5 out of 7 days PT Duration Estimated Length of Stay: 7-10 days PT Treatment/Interventions: Ambulation/gait training;Balance/vestibular training;Disease management/prevention;Discharge planning;Functional mobility training;Psychosocial support;Therapeutic Activities;Therapeutic Exercise;Neuromuscular re-education;UE/LE Strength taining/ROM;Pain management;DME/adaptive equipment instruction;Community reintegration;Patient/family education;Stair training;UE/LE Coordination activities PT Transfers Anticipated Outcome(s): modI PT Locomotion Anticipated Outcome(s): modI with LRAD, S stairs PT Recommendation Recommendations for Other Services: Therapeutic Recreation consult Therapeutic Recreation Interventions: Pet therapy;Kitchen group;Outing/community reintergration Follow Up Recommendations: Home health PT Patient destination: Home Equipment Recommended: None recommended by PT Equipment Details: has RW  Skilled Therapeutic Intervention Pt received seated in bed, denies pain and agreeable to treatment. PT evaluation performed and completed as below with min guard to S overall. Checked off husband to provide min guard for transfers and ambulation in/out of bathroom; NT alerted. Pt limited by LLE ROM restrictions in knee immobilizer, occasional pain in LLE during mobility activities, and discomfort at wound vac site. Pt and husband educated on rehab process, goals, estimated LOS, fall prevention safety. Pt and husband agreeable to all the above. Remained seated in recliner at end of session, all needs in reach.   PT Evaluation Precautions/Restrictions Precautions Precautions: Knee;Fall Required Braces or Orthoses:  Other Brace/Splint;Knee Immobilizer - Left Knee Immobilizer - Left: On when out of bed or walking General Chart Reviewed: Yes Response to Previous Treatment: Patient reporting fatigue but able to participate. Family/Caregiver Present: Yes  Pain Pain Assessment Pain Assessment: No/denies pain Home Living/Prior Functioning Home Living Available Help at Discharge: Family Type of Home: House Home Access: Stairs to enter Technical brewer of Steps: 3 Entrance Stairs-Rails: None Home Layout: One level Bathroom Shower/Tub: Systems developer: Yes Additional Comments: Homehealth prior to admission for L TKA  Lives With: Spouse Vision/Perception  Perception Perception: Within Functional Limits Praxis Praxis: Intact  Cognition Overall Cognitive Status: Within Functional Limits for tasks assessed Arousal/Alertness: Awake/alert Memory: Appears intact Sensation Sensation Light Touch: Appears Intact Coordination Gross Motor Movements are Fluid and Coordinated: Yes Fine Motor Movements are Fluid and Coordinated: Yes Motor  Motor Motor: Within Functional Limits  Mobility Bed Mobility Bed Mobility: Supine to Sit;Sit to Supine Supine to Sit: 5: Supervision;HOB flat Supine to Sit Details: Verbal cues for technique;Verbal cues for precautions/safety Sit to Supine: 4: Min assist;HOB flat Sit to Supine - Details: Tactile cues for placement;Verbal cues for precautions/safety;Verbal cues for technique Transfers Transfers: Yes Sit to Stand: 4: Min guard Sit to Stand Details: Verbal cues for technique;Verbal cues for precautions/safety Stand to Sit: 5: Supervision;With armrests;With upper extremity assist Stand to Sit Details (indicate cue type and reason): Verbal cues for precautions/safety Locomotion  Ambulation Ambulation: Yes Ambulation/Gait Assistance: 5: Supervision;4: Min guard Ambulation Distance (Feet): 100 Feet Assistive device: Rolling  walker Ambulation/Gait Assistance Details: Verbal cues for technique;Verbal cues for precautions/safety;Verbal cues for gait pattern Gait Gait: Yes Gait Pattern: Impaired Gait Pattern: Poor foot clearance - left;Decreased stance time - left;Decreased stride length;Step-through pattern;Antalgic Gait velocity: decreased for age/gender norms Stairs / Additional Locomotion Stairs: Yes Stairs Assistance: 4: Min guard;5: Supervision Stairs Assistance Details: Verbal cues for gait pattern;Verbal cues for technique;Verbal cues for precautions/safety Stair Management Technique:  Two rails;Step to pattern;Forwards Number of Stairs: 12 Height of Stairs: 3 Wheelchair Mobility Wheelchair Mobility: No  Trunk/Postural Assessment  Cervical Assessment Cervical Assessment: Within Functional Limits Thoracic Assessment Thoracic Assessment: Within Functional Limits Lumbar Assessment Lumbar Assessment: Within Functional Limits Postural Control Postural Control: Within Functional Limits  Balance Balance Balance Assessed: Yes Dynamic Sitting Balance Dynamic Sitting - Balance Support: Feet supported Dynamic Sitting - Level of Assistance: 6: Modified independent (Device/Increase time) Sitting balance - Comments: dressing Dynamic Standing Balance Dynamic Standing - Level of Assistance: 4: Min assist Dynamic Standing - Comments: dresing Extremity Assessment  RUE Assessment RUE Assessment: Within Functional Limits LUE Assessment LUE Assessment: Within Functional Limits RLE Assessment RLE Assessment: Within Functional Limits (grossly 4+5 throughout) LLE Assessment LLE Assessment: Exceptions to [facility] (knee immobilizer, 4/5 hip flexion)   See Function Navigator for Current Functional Status.   Refer to Care Plan for Long Term Goals  Recommendations for other services: Therapeutic Recreation  Pet therapy, Kitchen group and Outing/community reintegration  Discharge Criteria: Patient will be  discharged from PT if patient refuses treatment 3 consecutive times without medical reason, if treatment goals not met, if there is a change in medical status, if patient makes no progress towards goals or if patient is discharged from hospital.  The above assessment, treatment plan, treatment alternatives and goals were discussed and mutually agreed upon: by patient and by family  [name] 03/24/2017, 9:47 AM

## 2017-03-25 ENCOUNTER — Inpatient Hospital Stay (HOSPITAL_COMMUNITY): Payer: 59 | Admitting: Occupational Therapy

## 2017-03-25 ENCOUNTER — Inpatient Hospital Stay (HOSPITAL_COMMUNITY): Payer: 59 | Admitting: Physical Therapy

## 2017-03-25 DIAGNOSIS — Z933 Colostomy status: Secondary | ICD-10-CM

## 2017-03-25 LAB — BASIC METABOLIC PANEL
ANION GAP: 9 (ref 5–15)
BUN: 12 mg/dL (ref 6–20)
CO2: 28 mmol/L (ref 22–32)
CREATININE: 0.99 mg/dL (ref 0.44–1.00)
Calcium: 8.6 mg/dL — ABNORMAL LOW (ref 8.9–10.3)
Chloride: 104 mmol/L (ref 101–111)
GFR calc non Af Amer: >60 mL/min (ref >60)
Glucose, Bld: 100 mg/dL — ABNORMAL HIGH (ref 65–99)
Potassium: 3.4 mmol/L — ABNORMAL LOW (ref 3.5–5.1)
SODIUM: 141 mmol/L (ref 135–145)

## 2017-03-25 LAB — CBC WITH DIFFERENTIAL/PLATELET
BASOS PCT: 0 %
Basophils Absolute: 0 10*3/uL (ref 0.0–0.1)
EOS ABS: 0.2 10*3/uL (ref 0.0–0.7)
Eosinophils Relative: 1 %
HEMATOCRIT: 27.3 % — AB (ref 36.0–46.0)
HEMOGLOBIN: 8.7 g/dL — AB (ref 12.0–15.0)
Lymphocytes Relative: 8 %
Lymphs Abs: 1.3 10*3/uL (ref 0.7–4.0)
MCH: 31 pg (ref 26.0–34.0)
MCHC: 31.9 g/dL (ref 30.0–36.0)
MCV: 97.2 fL (ref 78.0–100.0)
MONOS PCT: 3 %
Monocytes Absolute: 0.5 10*3/uL (ref 0.1–1.0)
NEUTROS ABS: 14.4 10*3/uL — AB (ref 1.7–7.7)
NEUTROS PCT: 88 %
Platelets: 378 10*3/uL (ref 150–400)
RBC: 2.81 MIL/uL — AB (ref 3.87–5.11)
RDW: 14.3 % (ref 11.5–15.5)
WBC: 16.4 10*3/uL — AB (ref 4.0–10.5)

## 2017-03-25 LAB — URINE CULTURE: Culture: NO GROWTH

## 2017-03-25 NOTE — Progress Notes (Signed)
Occupational Therapy Session Note  Patient Details  Name: [name] MRN: [id number] Date of Birth: 09/25/1961  Today's Date: 03/25/2017 OT Individual Time: 3154-0086 and 1435-1535 OT Individual Time Calculation (min): 62 min and 60 min   Short Term Goals: Week 1:  OT Short Term Goal 1 (Week 1): STG = LTG d/t ELOS  Skilled Therapeutic Interventions/Progress Updates:    Tx focus on functional ambulation, endurance, and AE training during functional tasks.   Pt greeted in recliner with RN present. Per RN, wound vac stopped working and she disconnected it for tx. Pt very teary about having wound vac/colostomy. Expressing anxieties about going home and managing new medical needs. Pt provided encouragement and therapeutic listening. Initiated discussion regarding DME needs and alternative methods of purchasing tub bench to accommodate financial restraints. To address pts affect, used therapy ipad to play meaningful music during tx. Pt completed LB dressing with reacher and sock aide with mod cues for technique. Pt issued with walker/reacher bags for ease of item transport. She then engaged in bedmaking, ambulating around room with supervision and RW. Cues for safe walker placement to improve UE support/stability. Pt using bilateral UEs in standing for donning fitted sheet, placing dirty linen in laundry bag, and donning pillowcases. Pt initiating rest breaks PRN. She then completed oral care/grooming tasks at sink for standing endurance. Toilet transfer/toileting completed with overall supervision with RW. At end of tx pt transferred to bed and was left with spouse.   Pt ambulated without KI throughout session. No LOBs. Pt refused to wear abdominal binder due to colostomy bag.  2nd Session 1:1 tx (60 min) Tx focus on standing endurance, functional ambulation, and psychosocial wellness.   Pt greeted in recliner with family present. Agreeable to session. With suggestion, pt expressed strong desire  to go outside for tx. Indoor ambulation amounted in total to 506 ft. Pt changing directions and navigating up inclines PRN with extra time and rest breaks. Outside, pt ambulating over uneven surfaces and around environmental barriers with RW and supervision. Bench and low chair transfers completed with supervision and RW. Once she returned to room, pt completed toilet transfer/toileting with supervision. Discussed grounds pass with pt and with RN/MD collaboration, pt issued pass for her and her husband to go outdoors this week. Pt very appreciative today, teary, and hugged  OT. "Thank you. Last week I thought I'd never be able to go outside again." Pt in recliner with all needs and spouse present at time of departure.    Therapy Documentation Precautions:  Precautions Precautions: Knee, Fall Required Braces or Orthoses: Other Brace/Splint, Knee Immobilizer - Left Knee Immobilizer - Left: On when out of bed or walking Restrictions Weight Bearing Restrictions: No LLE Weight Bearing: Weight bearing as tolerated   Pain: RN notified to provide pain meds at end of tx  Pain Assessment Pain Assessment: 0-10 Pain Score: 0-No pain Pain Type: Acute pain Pain Location: Abdomen Pain Orientation: Mid Pain Descriptors / Indicators: Aching Pain Frequency: Constant Pain Onset: On-going Patients Stated Pain Goal: 0 Pain Intervention(s): Medication (See eMAR) ADL:      See Function Navigator for Current Functional Status.   Therapy/Group: Individual Therapy  Jenna Ardoin A Lakai Moree 03/25/2017, 12:30 PM

## 2017-03-25 NOTE — Progress Notes (Signed)
[name] is a 56 y.o. female 1960/11/03 [id number]  Subjective: No new complaints. No new problems. Slept well. Feeling OK.  Objective: Vital signs in last 24 hours: Temp:  [99.2 F (37.3 C)] 99.2 F (37.3 C) (05/27 1301) Pulse Rate:  [92-102] 102 (05/27 1301) Resp:  [18] 18 (05/27 1301) BP: (127)/(65-73) 127/73 (05/27 1301) SpO2:  [98 %] 98 % (05/27 1301) Weight change:  Last BM Date: 03/25/17  Intake/Output from previous day: 05/26 0701 - 05/27 0700 In: 480 [P.O.:480] Out: 600 [Stool:600] Last cbgs: CBG (last 3)  No results for input(s): GLUCAP in the last 72 hours.   Physical Exam General: No apparent distress   HEENT: not dry Lungs: Normal effort. Lungs clear to auscultation, no crackles or wheezes. Cardiovascular: Regular rate and rhythm, no edema Abdomen: S/NT/ND; BS(+) colostomy  Musculoskeletal:  unchanged Neurological: No new neurological deficits Wounds: clear  Skin: clear  Aging changes Mental state: Alert, oriented, cooperative In a w/c    Lab Results: BMET    Component Value Date/Time   NA 141 03/25/2017 0513   K 3.4 (L) 03/25/2017 0513   CL 104 03/25/2017 0513   CO2 28 03/25/2017 0513   GLUCOSE 100 (H) 03/25/2017 0513   BUN 12 03/25/2017 0513   CREATININE 0.99 03/25/2017 0513   CALCIUM 8.6 (L) 03/25/2017 0513   GFRNONAA >60 03/25/2017 0513   GFRAA >60 03/25/2017 0513   CBC    Component Value Date/Time   WBC 16.4 (H) 03/25/2017 0513   RBC 2.81 (L) 03/25/2017 0513   HGB 8.7 (L) 03/25/2017 0513   HCT 27.3 (L) 03/25/2017 0513   PLT 378 03/25/2017 0513   MCV 97.2 03/25/2017 0513   MCH 31.0 03/25/2017 0513   MCHC 31.9 03/25/2017 0513   RDW 14.3 03/25/2017 0513   LYMPHSABS 1.3 03/25/2017 0513   MONOABS 0.5 03/25/2017 0513   EOSABS 0.2 03/25/2017 0513   BASOSABS 0.0 03/25/2017 0513    Studies/Results: No results found.  Medications: I have reviewed the patient's current medications.  Assessment/Plan:  1. S/p R TKR -- CIR 2.  S/p colectomy 3. Pain control - Oxycodone prn 4. Fluids po; IV d/c'd 5. DVT prophylaxis 6. HTN - Benazepril    Length of stay, days: 2  Walker Kehr , MD 03/25/2017, 2:28 PM

## 2017-03-25 NOTE — Progress Notes (Signed)
Physical Therapy Session Note  Patient Details  Name: [name] MRN: [id number] Date of Birth: 10-21-1961  Today's Date: 03/25/2017 PT Individual Time: 1300-1355 PT Individual Time Calculation (min): 55 min   Short Term Goals: Week 1:  PT Short Term Goal 1 (Week 1): =LTG due to estimated LOS  Skilled Therapeutic Interventions/Progress Updates: Pt received seated in recliner, denies pain and agreeable to treatment. Transfer recliner>bed with min guard, no AD. Sit >supine with S. Upon reaching supine position to perform LE exercises pt reports needing to use restroom. Supine>sit with S. Gait in/out of bathroom with RW and S, assist for managing wound vac; performed hygiene and clothing management with S. Supine LE strengthening including ankle pumps, heel slides, quad sets, glute sets, short arc quads, straight leg raise 2x15 reps each. Gait to gym x175' with RW and S; therapist again assisting with managing wound vac. Seated LLE flexion AAROM with assist from RLE. Gait on ramp and uneven mulch surface with RW and S. Pt returned to room with gait x150' with S. Pt reporting significant fatigue and politely requesting to end session early. Remained seated in recliner at end of session, all needs in reach.       Therapy Documentation Precautions:  Precautions Precautions: Knee, Fall Required Braces or Orthoses: Other Brace/Splint, Knee Immobilizer - Left Knee Immobilizer - Left: On when out of bed or walking Restrictions Weight Bearing Restrictions: No LLE Weight Bearing: Weight bearing as tolerated General: PT Amount of Missed Time (min): 20 Minutes Pain: Pain Assessment Pain Assessment: 0-10 Pain Score: 0-No pain Pain Type: Acute pain Pain Location: Abdomen Pain Orientation: Mid Pain Descriptors / Indicators: Aching Pain Frequency: Constant Pain Onset: On-going Patients Stated Pain Goal: 0 Pain Intervention(s): Medication (See eMAR)   See Function Navigator for Current  Functional Status.   Therapy/Group: Individual Therapy  [name] 03/25/2017, 1:59 PM

## 2017-03-26 ENCOUNTER — Inpatient Hospital Stay (HOSPITAL_COMMUNITY): Payer: 59 | Admitting: Physical Therapy

## 2017-03-26 DIAGNOSIS — D62 Acute posthemorrhagic anemia: Secondary | ICD-10-CM

## 2017-03-26 DIAGNOSIS — I10 Essential (primary) hypertension: Secondary | ICD-10-CM

## 2017-03-26 DIAGNOSIS — D72829 Elevated white blood cell count, unspecified: Secondary | ICD-10-CM

## 2017-03-26 DIAGNOSIS — E876 Hypokalemia: Secondary | ICD-10-CM

## 2017-03-26 DIAGNOSIS — Z96652 Presence of left artificial knee joint: Secondary | ICD-10-CM

## 2017-03-26 LAB — CBC WITH DIFFERENTIAL/PLATELET
BASOS PCT: 0 %
Basophils Absolute: 0 10*3/uL (ref 0.0–0.1)
Eosinophils Absolute: 0.2 10*3/uL (ref 0.0–0.7)
Eosinophils Relative: 1 %
HEMATOCRIT: 31.1 % — AB (ref 36.0–46.0)
Hemoglobin: 9.8 g/dL — ABNORMAL LOW (ref 12.0–15.0)
LYMPHS ABS: 1.6 10*3/uL (ref 0.7–4.0)
Lymphocytes Relative: 11 %
MCH: 31 pg (ref 26.0–34.0)
MCHC: 31.5 g/dL (ref 30.0–36.0)
MCV: 98.4 fL (ref 78.0–100.0)
MONO ABS: 0.4 10*3/uL (ref 0.1–1.0)
MONOS PCT: 2 %
NEUTROS ABS: 13.3 10*3/uL — AB (ref 1.7–7.7)
Neutrophils Relative %: 86 %
Platelets: 439 10*3/uL — ABNORMAL HIGH (ref 150–400)
RBC: 3.16 MIL/uL — ABNORMAL LOW (ref 3.87–5.11)
RDW: 14.8 % (ref 11.5–15.5)
WBC: 15.5 10*3/uL — ABNORMAL HIGH (ref 4.0–10.5)

## 2017-03-26 NOTE — Progress Notes (Signed)
Social Work Assessment and Plan Social Work Assessment and Plan  Patient Details  Name: [name] MRN: [id number] Date of Birth: 06-09-1961  Today's Date: 03/26/2017  Problem List:  Patient Active Problem List   Diagnosis Date Noted  . Leukocytosis   . Hypokalemia   . Acute blood loss anemia   . Benign essential HTN   . History of total left knee replacement   . Perforated bowel ([place]) 03/23/2017  . S/P total knee replacement using cement, left 03/12/2017 by Dr Noemi Chapel 03/19/2017  . Perforated diverticulum   . History of lymphoma   . Hyperglycemia   . Post-op pain   . Perforation of sigmoid colon due to diverticulitis 03/17/2017  . Primary localized osteoarthritis of left knee   . Hypothyroidism   . Hypertension   . Cancer ([place])   . OSTEOARTHRITIS, KNEE, RIGHT 03/30/2010   Past Medical History:  Past Medical History:  Diagnosis Date  . Anxiety   . Cancer ([place])    hodgkins lymphoma, 1992 Diagnosed  . Hypertension   . Hypothyroidism    "thyroid is dead"  . Primary localized osteoarthritis of left knee   . S/P total knee replacement using cement, left 03/19/2017   Past Surgical History:  Past Surgical History:  Procedure Laterality Date  . AXILLARY LYMPH NODE DISSECTION Right 1992  . BONE MARROW TRANSPLANT    . COLON RESECTION SIGMOID N/A 03/17/2017   Procedure: COLON RESECTION SIGMOID AND CREATION OF COLOSTOMY;  Surgeon: Donnie Mesa, MD;  Location: [place];  Service: General;  Laterality: N/A;  . COLONOSCOPY N/A 09/07/2015   Procedure: COLONOSCOPY;  Surgeon: Aviva Signs, MD;  [facility];  Service: Gastroenterology;  Laterality: N/A;  . ECTOPIC PREGNANCY SURGERY  1990  . TONSILLECTOMY  1965  . TOTAL KNEE ARTHROPLASTY Left 03/12/2017   Procedure: LEFT TOTAL KNEE ARTHROPLASTY;  Surgeon: Elsie Saas, MD;  Location: [place];  Service: Orthopedics;  Laterality: Left;   Social History:  reports that she quit smoking about 26 years ago. She has never used  smokeless tobacco. She reports that she does not drink alcohol or use drugs.  Family / Support Systems Marital Status: Married Patient Roles: Spouse, Parent, Other (Comment) (employee) Spouse/Significant Other: [name] [phone]  [phone] Children: Daughter Other Supports: Sister Anticipated Caregiver: Husband, daughter and sister Ability/Limitations of Caregiver: Husband may take FMLA, other family members wrok Caregiver Availability: Other (Comment) (Awaiting goals and will set up care) Family Dynamics: Close knit family children and extended family very involved and supportive. Pt is also one who wants to do everything for herself and not rely upon others. She is usually the caregiver's of others not the one needing care.  Social History Preferred language: English Religion: Baptist Cultural Background: No issues Education: [employer] Read: Yes Write: Yes Employment Status: Employed Name of Employer: Hotel manager Return to Work Plans: Plans to return when able and cleared medically Freight forwarder Issues: No issues Guardian/Conservator: None-according to MD pt is capable of making her own decisions while here. Husband has been here with pt daily also   Abuse/Neglect Physical Abuse: Denies Verbal Abuse: Denies Sexual Abuse: Denies Exploitation of patient/patient's resources: Denies Self-Neglect: Denies  Emotional Status Pt's affect, behavior adn adjustment status: Pt is motivated to do well and recover from her knee surgery and colostomy surgery. She is one who takes things as they come and will deal with them and overcome them. Her husband is very supportive and willing to assist her. Recent Psychosocial  Issues: recent TKR and now her colostomy Pyschiatric History: hx-anxiety takes medicines for this and finds them helpful. She is able to talk aobut her concerns and feelings and feels there was no option it had to be done. She has a strong faith and this  pulls her through also. Substance Abuse History: No issues  Patient / Family Perceptions, Expectations & Goals Pt/Family understanding of illness & functional limitations: Pt and husband can explain her surgeries and treatment plan. She is hopeful she will not go home with the wound vac and this will be dc'ed before she goes. Both feel their questions and concerns have ben addressed. Premorbid pt/family roles/activities: Wife, Mother, grandmother, employee, church member, friend, etc Anticipated changes in roles/activities/participation: resume Pt/family expectations/goals: Pt states: " I want to be able to do for myself before I leave here, that is what I've always done."  Husband states: " I know if it is up to her she will be independent but I will help her with anything she needs."  [employer]: None Premorbid Home Care/DME Agencies: Other (Comment) ([facility] to follow at [place]) Transportation available at discharge: Family Resource referrals recommended: Support group (specify)  Discharge Planning Living Arrangements: Spouse/significant other Support Systems: Spouse/significant other, Children, Other relatives, Friends/neighbors, Social worker community Type of Residence: Private residence Insurance Resources: Multimedia programmer (specify) Sports administrator) Financial Resources: Employment, Secondary school teacher Screen Referred: No Living Expenses: Own Money Management: Spouse, Patient Does the patient have any problems obtaining your medications?: No Home Management: Patient Patient/Family Preliminary Plans: Return home with husband who may take a FMLA to be there with her for a short time, this depends upon how much care she needs at discharge from rehab. Will work on discharge needs she has equipment from TKR arranged already. Husband is here for therapies today. Social Work Anticipated Follow Up Needs: HH/OP, Support Group  Clinical Impression Pleasant female  who is motivated to do well and move along in her recovery. Her husband is here and very supportive and willing to assist her at discharge. Questions regarding having to go home with a wound vac, will try to clarify with MD regarding this. Will work on discharge plans, team conference Wed, goals for mod/i level.  Elease Hashimoto 03/26/2017, 10:15 AM

## 2017-03-26 NOTE — IPOC Note (Addendum)
Overall Plan of Care [facility]) Patient Details Name: [name] MRN: [id number] DOB: Jul 09, 1961  Admitting Diagnosis: Debility with perforated bowel and L tkr  Hospital Problems: Active Problems:   Perforated bowel ([place])   Leukocytosis   Hypokalemia   Acute blood loss anemia   Benign essential HTN   History of total left knee replacement     Functional Problem List: Nursing Bladder, Bowel, Edema, Endurance, Medication Management, Nutrition, Pain, Safety, Skin Integrity, Motor, Sensory  PT Balance, Endurance, Pain, Safety  OT Balance, Endurance, Motor, Pain, Safety  SLP    TR         Basic ADL's: OT Grooming, Dressing, Bathing, Toileting     Advanced  ADL's: OT Simple Meal Preparation     Transfers: PT Bed Mobility, Bed to Chair, Car, Manufacturing systems engineer, Metallurgist: PT Ambulation, Stairs     Additional Impairments: OT    SLP        TR      Anticipated Outcomes Item Anticipated Outcome  Self Feeding MOD I  Swallowing      Basic self-care  MOD I  Toileting  MOD I   Bathroom Transfers MOD I  Bowel/Bladder  min assist  Transfers  modI  Locomotion  modI with LRAD, S stairs  Communication     Cognition     Pain  less<3  Safety/Judgment  min assist   Therapy Plan: PT Intensity: Minimum of 1-2 x/day ,45 to 90 minutes PT Frequency: 5 out of 7 days PT Duration Estimated Length of Stay: 7-10 days OT Intensity: Minimum of 1-2 x/day, 45 to 90 minutes OT Frequency: 5 out of 7 days OT Duration/Estimated Length of Stay: 7-10         Team Interventions: Nursing Interventions Patient/Family Education, Bladder Management, Bowel Management, Disease Management/Prevention, Pain Management, Skin Care/Wound Management, Discharge Planning, Psychosocial Support  PT interventions Ambulation/gait training, Balance/vestibular training, Disease management/prevention, Discharge planning, Functional mobility training, Psychosocial support, Therapeutic  Activities, Therapeutic Exercise, Neuromuscular re-education, UE/LE Strength taining/ROM, Pain management, DME/adaptive equipment instruction, Community reintegration, Barrister's clerk education, IT trainer, UE/LE Coordination activities  OT Interventions Training and development officer, Academic librarian, Discharge planning, DME/adaptive equipment instruction, Functional mobility training, Pain management, Patient/family education, Psychosocial support, Self Care/advanced ADL retraining, Skin care/wound managment, Therapeutic Activities, Therapeutic Exercise, UE/LE Strength taining/ROM, UE/LE Coordination activities  SLP Interventions    TR Interventions    SW/CM Interventions Discharge Planning, Psychosocial Support, Patient/Family Education    Team Discharge Planning: Destination: PT-Home ,OT- Home , SLP-  Projected Follow-up: PT-Home health PT, OT-  Home health OT, SLP-  Projected Equipment Needs: PT-None recommended by PT, OT- Tub/shower bench, To be determined, SLP-  Equipment Details: PT-has RW, OT-has BSC Patient/family involved in discharge planning: PT- Patient, Family member/caregiver,  OT-Patient, Family member/caregiver, SLP-   MD ELOS: 6-9 days. Medical Rehab Prognosis:  Excellent Assessment: 56 y.o. female with history of lymphoma, L-TKR 5/14 who was discharged to home but readmitted on 5/19 with LLQ pain due to perforated diverticulitis.She underwent sigmoid colectomy with diverting colostomy by Dr. Georgette Dover. Post op on IV antibiotics and diet has slowly been advanced to soft. Wound VAC in place and being changed MWF by WOC. She has had persistent leucocytosis and spiked temp 101 on 5/22 therefore started on Vanc/Zosyn. She has defervesced and antibiotic narrowed to Augmentin X 10 days.  She is currently limited by pain with inability with deficits in mobility. Will set goals for Mod I with PT/OT.  See Team Conference Notes for weekly updates to the plan of care

## 2017-03-26 NOTE — Care Management Note (Signed)
Inpatient [facility] Individual Statement of Services  Patient Name:  [name]  Date:  03/26/2017  Welcome to the [facility].  Our goal is to provide you with an individualized program based on your diagnosis and situation, designed to meet your specific needs.  With this comprehensive rehabilitation program, you will be expected to participate in at least 3 hours of rehabilitation therapies Monday-Friday, with modified therapy programming on the weekends.  Your rehabilitation program will include the following services:  Physical Therapy (PT), Occupational Therapy (OT), Speech Therapy (ST), 24 hour per day rehabilitation nursing, Neuropsychology, Case Management (Social Worker), Rehabilitation Medicine, Nutrition Services and Pharmacy Services  Weekly team conferences will be held on Wednesday to discuss your progress.  Your Social Worker will talk with you frequently to get your input and to update you on team discussions.  Team conferences with you and your family in attendance may also be held.  Expected length of stay: 7-10 days Overall anticipated outcome: mod/i level  Depending on your progress and recovery, your program may change. Your Social Worker will coordinate services and will keep you informed of any changes. Your Social Worker's name and contact numbers are listed  below.  The following services may also be recommended but are not provided by the [facility] will be made to provide these services after discharge if needed.  Arrangements include referral to agencies that provide these services.  Your insurance has been verified to be:  [facility] Your primary doctor is:  Sharilyn Sites  Pertinent information will be shared with your doctor and your insurance company.  Social Worker:   Ovidio Kin, [place] or ([phone]  Information discussed with and copy given to patient by: Elease Hashimoto, 03/26/2017, 9:25 AM

## 2017-03-26 NOTE — Progress Notes (Addendum)
[facility] PHYSICAL MEDICINE & REHABILITATION     PROGRESS NOTE  Subjective/Complaints:  Pt seen sitting up in her chair this AM.  She slept well overnight and had a good weekend.   ROS: Denies CP, SOB, N/V/D.  Objective: Vital Signs: Blood pressure 124/60, pulse 89, temperature 98.7 F (37.1 C), temperature source Oral, resp. rate 16, height 5\' 3"  (1.6 m), weight 104.8 kg (231 lb 0.7 oz), SpO2 92 %. No results found.  Recent Labs  03/24/17 0420 03/25/17 0513  WBC 14.8* 16.4*  HGB 8.7* 8.7*  HCT 27.4* 27.3*  PLT 349 378    Recent Labs  03/24/17 0420 03/25/17 0513  NA 141 141  K 3.5 3.4*  CL 105 104  GLUCOSE 109* 100*  BUN 11 12  CREATININE 1.09* 0.99  CALCIUM 8.3* 8.6*   CBG (last 3)  No results for input(s): GLUCAP in the last 72 hours.  Wt Readings from Last 3 Encounters:  03/24/17 104.8 kg (231 lb 0.7 oz)  03/16/17 104.8 kg (231 lb)  02/28/17 104.8 kg (231 lb)    Physical Exam:  BP 124/60 (BP Location: Left Arm)   Pulse 89   Temp 98.7 F (37.1 C) (Oral)   Resp 16   Ht 5\' 3"  (1.6 m)   Wt 104.8 kg (231 lb 0.7 oz)   SpO2 92%   BMI 40.93 kg/m  Gen: NAD. Obese. Head: Normocephalic and atraumatic.  Eyes: EOMI. No discharge.  Cardiovascular: Normal rate and regular rhythm.  No JVD. Respiratory: Effort normal. No stridor.  GI: Soft. There is tenderness.  Musculoskeletal: She exhibits edema. She exhibits no tenderness.  Neurological: She is alert and oriented Motor: B/l UE motor 5/5.  LLE: 4+/5 HF, HF, 5/5 ADF/PF.  No sensory deficits.   Skin: Skin is warm and dry.  +Abdominal wound with VAC in place  Left knee incision clean,dry, intact.  Psychiatric: She has a normal mood and affect. Her behavior is normal. Thought content normal.   Assessment/Plan: 1. Functional deficits secondary to debility after perforated colon and recent left TKA and which require 3+ hours per day of interdisciplinary therapy in a comprehensive inpatient rehab  setting. Physiatrist is providing close team supervision and 24 hour management of active medical problems listed below. Physiatrist and rehab team continue to assess barriers to discharge/monitor patient progress toward functional and medical goals.  Function:  Bathing Bathing position Bathing activity did not occur: Refused Position: Wheelchair/chair at sink  Bathing parts Body parts bathed by patient: Right arm, Left arm, Chest, Abdomen, Front perineal area, Right upper leg, Left upper leg, Left lower leg, Back, Right lower leg Body parts bathed by helper: Back, Buttocks  Bathing assist Assist Level: Touching or steadying assistance(Pt > 75%)      Upper Body Dressing/Undressing Upper body dressing   What is the patient wearing?: Pull over shirt/dress     Pull over shirt/dress - Perfomed by patient: Thread/unthread right sleeve, Thread/unthread left sleeve, Pull shirt over trunk, Put head through opening          Upper body assist Assist Level: Supervision or verbal cues      Lower Body Dressing/Undressing Lower body dressing   What is the patient wearing?: Pants, Non-skid slipper socks Underwear - Performed by patient: Thread/unthread right underwear leg, Thread/unthread left underwear leg, Pull underwear up/down   Pants- Performed by patient: Thread/unthread right pants leg, Thread/unthread left pants leg, Pull pants up/down   Non-skid slipper socks- Performed by patient: Don/doff right sock,  Don/doff left sock                    Lower body assist Assist for lower body dressing: Supervision or verbal cues, Assistive device Assistive Device Comment: reacher, sock aide    Toileting Toileting   Toileting steps completed by patient: Adjust clothing prior to toileting, Performs perineal hygiene, Adjust clothing after toileting   Toileting Assistive Devices: Grab bar or rail  Toileting assist Assist level: Supervision or verbal cues   Transfers Chair/bed transfer    Chair/bed transfer method: Ambulatory Chair/bed transfer assist level: Supervision or verbal cues Chair/bed transfer assistive device: Armrests, Medical sales representative     Max distance: 160 Assist level: Supervision or verbal cues   Wheelchair Wheelchair activity did not occur: N/A        Cognition Comprehension Comprehension assist level: Follows complex conversation/direction with no assist  Expression Expression assist level: Expresses complex ideas: With extra time/assistive device  Social Interaction Social Interaction assist level: Interacts appropriately with others with medication or extra time (anti-anxiety, antidepressant).  Problem Solving Problem solving assist level: Solves complex 90% of the time/cues < 10% of the time  Memory Memory assist level: Complete Independence: No helper    Medical Problem List and Plan: 1.  Functional and mobility deficits secondary to debility after perforated colon and recent left TKA   Cont CIR   Notes reviewed, images reviewed 2.  DVT Prophylaxis/Anticoagulation: Pharmaceutical: Lovenox 3. Pain Management: continue oxycodone prn. 4. Mood: team to provide ego support. LCSW to follow for evaluation and support. Added protein supplement to promote wound healing.  5. Neuropsych: This patient is capable of making decisions on her own behalf. 6. Skin/Wound Care: routine pressure relief measures. Maintain adequate nutritional and hydration status.              -continue vac             -stoma/ostomy care per [place] RN 7. Fluids/Electrolytes/Nutrition:Monitor I/O.  8. Leucocytosis/FUO: Monitor CBC serially  Augmentin D #4/10  WBCs 16.4 on 5/27, labs pending for today  Afebrile 9. HTN: Monitor BP bid. On benazepril daily.  Controlled 5/28 10.   ABLA:  Start iron supplement.    Hb 8.7 on 5/27, labs pending for today 11. Hypokalemia  K+ 3.4 on 5/27, labs pending for today  Cont to monitor  LOS (Days) 3 A FACE TO FACE  EVALUATION WAS PERFORMED  Yuan Gann Lorie Phenix 03/26/2017 9:16 AM

## 2017-03-26 NOTE — Consult Note (Signed)
WOC contacted for issues with NPWT VAC dressing. Over the weekend she had to have dressing changed due to alarm "blockage".  This has again happened today.  I took down NPWT dressing, wound is mostly superficial today. Wound type: surgical  Measurement:20cm x 3.5cm x 1.5cm with small track at the proximal edge of the wound that is 3.5cm  Wound bed:100% clean, early granulation tissue Drainage (amount, consistency, odor) scant in canister Periwound: intact  Dressing procedure/placement/frequency: Packed track with moist saline 2x2, stretched out to make longer.  Filled remainder of the wound bed with saline moist 2x2 gauze. Covered with ABD pad, secured with tape. Change dressing daily. Will follow up with CCS on removal of NPWT.   [place] Nurse ostomy follow up Stoma type/location: LLQ, end colostomy Stomal assessment/size: 1" x 1 3/4" oval shaped, pink, moist flush with the skin Peristomal assessment: dips in the abdominal contour at 3 and 9 oclock  Treatment options for stomal/peristomal skin: using 1/2 of 2" barrier ring on each side of the stoma to flatted the dip in the skin out (at 3 and 9 o'clock) Output green, seedy Ostomy pouching: 1pc.flexible convexity Education provided:  Demonstrated pouch change to husband again, they have video with each step and have lots of support at home from friends that are nurses. Enrolled patient in [place] Discharge program/No  [place] Nurse will follow along with you for continued support with ostomy teaching and care Ragina Fenter [facility], [place], [place], [place]

## 2017-03-26 NOTE — Progress Notes (Signed)
Physical Therapy Session Note  Patient Details  Name: [name] MRN: [id number] Date of Birth: [birth year]  Today's Date: 03/26/2017 PT Individual Time: 1420-1515 PT Individual Time Calculation (min): 55 min   Short Term Goals: Week 1:  PT Short Term Goal 1 (Week 1): =LTG due to estimated LOS  Skilled Therapeutic Interventions/Progress Updates:  Pt received supine in bed just disconnected from wound VAC by RN. Primary PT discussed use of knee immobilizer with PA, and it was decided that pt no longer needs to wear knee immobilizer. Pt denies pain upon arrival for session. Pt mod I for supine>>sit and S for sit>>stand and ambulation to use the bathroom using RW. Pt ambulated from her room to the rehab gym >111f using RW and S. Nustep x10 min with focus on improving L knee ROM. Pt educated on use of pillow/towel roll placed under ankle during the day with LLE elevated to promote knee extension. Seated LLE knee flexion AAROM with assistance by RLE for increased L knee ROM. Pt performed quad sets 10x2 with towel roll under L ankle and verbal and tactile cues for quad activation and knee extension. SLR x10 on LLE with verbal cues to keep knee extended throughout exercise. Standing hip abduction with LLE x10 using RW for balance. Attempted standing hip abduction with RLE but pt unable to maintain balance standing on LLE. Tandem stance with LLE in back x165m with UE assist x1 on RW. Pt ambulated back to her room using RW with S with focus on terminal knee extension during gait. Pt left seated in recliner with LEs elevated and all needs met.   Therapy Documentation Precautions:  Precautions Precautions: Knee, Fall Required Braces or Orthoses: Other Brace/Splint, Knee Immobilizer - Left Knee Immobilizer - Left: On when out of bed or walking Restrictions Weight Bearing Restrictions: No LLE Weight Bearing: Weight bearing as tolerated General: PT Amount of Missed Time (min): 5 Minutes PT Missed  Treatment Reason: Nursing care   See Function Navigator for Current Functional Status.   Therapy/Group: Individual Therapy  CaAlysia Penna/28/2018, 3:46 PM

## 2017-03-27 ENCOUNTER — Inpatient Hospital Stay (HOSPITAL_COMMUNITY): Payer: 59 | Admitting: Physical Therapy

## 2017-03-27 ENCOUNTER — Inpatient Hospital Stay (HOSPITAL_COMMUNITY): Payer: 59 | Admitting: Occupational Therapy

## 2017-03-27 ENCOUNTER — Inpatient Hospital Stay (HOSPITAL_COMMUNITY): Payer: 59

## 2017-03-27 LAB — CBC WITH DIFFERENTIAL/PLATELET
Basophils Absolute: 0 10*3/uL (ref 0.0–0.1)
Basophils Relative: 0 %
EOS PCT: 2 %
Eosinophils Absolute: 0.2 10*3/uL (ref 0.0–0.7)
HEMATOCRIT: 28.5 % — AB (ref 36.0–46.0)
Hemoglobin: 8.9 g/dL — ABNORMAL LOW (ref 12.0–15.0)
LYMPHS ABS: 1.8 10*3/uL (ref 0.7–4.0)
LYMPHS PCT: 12 %
MCH: 30.6 pg (ref 26.0–34.0)
MCHC: 31.2 g/dL (ref 30.0–36.0)
MCV: 97.9 fL (ref 78.0–100.0)
MONO ABS: 0.6 10*3/uL (ref 0.1–1.0)
MONOS PCT: 4 %
NEUTROS ABS: 12.4 10*3/uL — AB (ref 1.7–7.7)
Neutrophils Relative %: 82 %
PLATELETS: 456 10*3/uL — AB (ref 150–400)
RBC: 2.91 MIL/uL — ABNORMAL LOW (ref 3.87–5.11)
RDW: 14.7 % (ref 11.5–15.5)
WBC: 15 10*3/uL — ABNORMAL HIGH (ref 4.0–10.5)

## 2017-03-27 LAB — BASIC METABOLIC PANEL
ANION GAP: 8 (ref 5–15)
BUN: 16 mg/dL (ref 6–20)
CO2: 27 mmol/L (ref 22–32)
Calcium: 8.1 mg/dL — ABNORMAL LOW (ref 8.9–10.3)
Chloride: 106 mmol/L (ref 101–111)
Creatinine, Ser: 1.03 mg/dL — ABNORMAL HIGH (ref 0.44–1.00)
GFR calc Af Amer: >60 mL/min (ref >60)
GFR calc non Af Amer: 59 mL/min — ABNORMAL LOW (ref >60)
GLUCOSE: 103 mg/dL — AB (ref 65–99)
POTASSIUM: 3.7 mmol/L (ref 3.5–5.1)
Sodium: 141 mmol/L (ref 135–145)

## 2017-03-27 NOTE — Progress Notes (Signed)
Occupational Therapy Note  Patient Details  Name: [name] MRN: [id number] Date of Birth: [birth year]  Today's Date: 03/27/2017 OT Individual Time: 1000-1028 OT Individual Time Calculation (min): 28 min   Pt denied pain Individual Therapy  Pt resting in recliner upon arrival.  Pt engaged in changing bed linens.  Focus on functional amb with RW, dynamic standing balance, safety awareness, and energy conservation strategies.  Pt also requested to use the bathroom and completed all tasks at supervision level.  Pt did not require any assistance with task and did not exhibit any unsafe behaviors.  Pt took one rest break during tasks.  Pt remained in recliner with all needs within reach and family present.    Leotis Shames [facility] 03/27/2017, 10:29 AM

## 2017-03-27 NOTE — Progress Notes (Signed)
Physical Therapy Session Note  Patient Details  Name: [name] MRN: [id number] Date of Birth: [birth year]  Today's Date: 03/27/2017 PT Individual Time: 1121-1200 PT Individual Time Calculation (min): 39 min   Short Term Goals: Week 1:  PT Short Term Goal 1 (Week 1): =LTG due to estimated LOS  Skilled Therapeutic Interventions/Progress Updates:    no c/o pain.  Session focus on L knee flexibility and strength and balance during functional mobility.   Pt ambulates to therapy gym supervision with RW.  Performs 2x10 AAROM for L knee flexion, and 2x10 AROM heel slides and SLR with min verbal cues for working within pain tolerance.  Nustep x8 minutes with BLEs only focus on L knee ROM.  Standing heel cord stretch with BUE support x1 minute.  PT introduced pt to [facility] and pt completed gait training x60' with [facility] and min assist fade to min guard.  Discussed using SBQC for increased challenge during therapies and determined safest LRAD to use at d/c.  Pt returned to room at end of session and positioned in bed with call bell in reach and needs met.   Therapy Documentation Precautions:  Precautions Precautions: Knee, Fall Required Braces or Orthoses: Other Brace/Splint, Knee Immobilizer - Left Knee Immobilizer - Left: On when out of bed or walking Restrictions Weight Bearing Restrictions: Yes LLE Weight Bearing: Weight bearing as tolerated   See Function Navigator for Current Functional Status.   Therapy/Group: Individual Therapy  Earnest Conroy Penven-Crew 03/27/2017, 12:17 PM

## 2017-03-27 NOTE — Progress Notes (Signed)
Occupational Therapy Session Note  Patient Details  Name: [name] MRN: [id number] Date of Birth: 09-29-1961  Today's Date: 03/27/2017  Session 1 OT Individual Time: 0803-0900 OT Individual Time Calculation (min): 57 min   Session 2 OT Individual Time: 1301-1400 OT Individual Time Calculation (min): 59 min   Short Term Goals: Week 1:  OT Short Term Goal 1 (Week 1): STG = LTG d/t ELOS  Skilled Therapeutic Interventions/Progress Updates:  Session 1   OT treatments session focused on modified bathing/dressing, and standing balance/endurance. OT checked with nursing on abdominal wound coverage for shower, then placed shower cover over abdominal incision and ostomy bag with pt in supine. Pt then doffed clothing with min A seated EOB, ambulated to toilet and voided bladder with supervision. Pt then transferred into shower w/ RW and VC for positioning. Shower completed with overall min A for LB bathing and to wash buttocks. Dynamic standing at the sink focused on balance strategies while performing B  UE grooming task of brushing hair. Pt left seated in recliner at end of session with needs met.  Session 2 OT treatment session focused on knee ROM, functional ambulation, dynamic standing balance/endurance, and tub shower transfers. Pt completed 10 mins on NuStep level 4; steps per min above 50; for 10 mins with 0 rest breaks. L knee ROM using friction reducing device for knee flexion -~90 degrees. Standing L Knee marches, ankle pumps, 10 x 3 sets. Pt then ambulate 200 ft w/ RW, supervision, and 2 seated rest breaks. Focus on step through and knee exension with kicking foam block. Pt returned to room and rec therapist present for remainder of session. Discussed meaningful occupations, goals, and dc plan. Pt reports she is very uncomfortable wit her colostomy and is concerned how it will affect her daily life. Provided encouragement and therapeutic listening. Applied ice pack to L knee and pt left  semi-reclined in bed.   Therapy Documentation Precautions:  Precautions Precautions: Knee, Fall Required Braces or Orthoses: Other Brace/Splint, Knee Immobilizer - Left Knee Immobilizer - Left: On when out of bed or walking Restrictions Weight Bearing Restrictions: Yes LLE Weight Bearing: Weight bearing as tolerated Pain:  none/denies pains:    See Function Navigator for Current Functional Status.   Therapy/Group: Individual Therapy  Valma Cava 03/27/2017, 3:52 PM

## 2017-03-27 NOTE — Evaluation (Signed)
Recreational Therapy Assessment and Plan  Patient Details  Name: [name] MRN: [id number] Date of Birth: 05-Nov-1960 Today's Date: 03/27/2017  Rehab Potential: Good ELOS: 10 days   Assessment Problem List:      Patient Active Problem List   Diagnosis Date Noted  . Perforated bowel ([place]) 03/23/2017  . S/P total knee replacement using cement, left 03/12/2017 by Dr Noemi Chapel 03/19/2017  . Perforated diverticulum   . History of lymphoma   . Hyperglycemia   . Post-op pain   . Perforation of sigmoid colon due to diverticulitis 03/17/2017  . Primary localized osteoarthritis of left knee   . Hypothyroidism   . Hypertension   . Cancer ([place])   . OSTEOARTHRITIS, KNEE, RIGHT 03/30/2010    Past Medical History:      Past Medical History:  Diagnosis Date  . Anxiety   . Cancer ([place])    hodgkins lymphoma, 1992 Diagnosed  . Hypertension   . Hypothyroidism    "thyroid is dead"  . Primary localized osteoarthritis of left knee   . S/P total knee replacement using cement, left 03/19/2017   Past Surgical History:       Past Surgical History:  Procedure Laterality Date  . AXILLARY LYMPH NODE DISSECTION Right 1992  . BONE MARROW TRANSPLANT    . COLON RESECTION SIGMOID N/A 03/17/2017   Procedure: COLON RESECTION SIGMOID AND CREATION OF COLOSTOMY;  Surgeon: Donnie Mesa, MD;  Location: [facility];  Service: General;  Laterality: N/A;  . COLONOSCOPY N/A 09/07/2015   Procedure: COLONOSCOPY;  Surgeon: Aviva Signs, MD;  [facility];  Service: Gastroenterology;  Laterality: N/A;  . ECTOPIC PREGNANCY SURGERY  1990  . TONSILLECTOMY  1965  . TOTAL KNEE ARTHROPLASTY Left 03/12/2017   Procedure: LEFT TOTAL KNEE ARTHROPLASTY;  Surgeon: Elsie Saas, MD;  Location: [facility];  Service: Orthopedics;  Laterality: Left;    Assessment & Plan Clinical Impression: [name] a 56 y.o.femalewith history of lymphoma, L-TKR 5/14 who was discharged to home but  readmitted on 5/19 with LLQ pain due to perforated diverticulitis. She underwent sigmoid colectomy with diverting colostomy by Dr. Georgette Dover. Post op on IV antibiotics and gradual advancement of diet to soft diet. Stoma pink with soft stool output. WBC elevated 5/24 of 17.1 with low grade temp noted 100.6 and then 15.6 today on 03/23/17. UA and CXR negative. US guided PIV and CT scan on 03/23/17 to r/o abscess. On IV Zosyn. CT revealed postoperative changes with left lower quadrant colostomy. Stranding fluid and gas with in the anterior abdominal wall compatible with recent postoperative change. Small amount of free fluid in the left paracolic gutter. Bilateral renal parapelvic and cortical cysts. Right nephrolithiasis. Trace bilateral pleural effusions. Pt medically cleared to admit to inpt rehab today.  Patient transferred to CIR on 03/23/2017.   Pt presents with decreased activity tolerance, decreased functional mobility, decreased balance Limiting pt's independence with leisure/community pursuits.  Leisure History/Participation Premorbid leisure interest/current participation: Medical laboratory scientific officer - Building control surveyor - Doctor, hospital - Travel (Comment);Community Administrator, sports (Comment) Expression Interests: Music (Comment) Other Leisure Interests: Television;Reading Leisure Participation Style: With Family/Friends Awareness of Community Resources: Excellent Psychosocial / Spiritual Spiritual Interests: Church;Womens'Men's Groups Patient agreeable to Pet Therapy: Yes Social interaction - Mood/Behavior: Cooperative Engineer, drilling for Education?: Yes Patient Agreeable to Outing?: Yes Recreational Therapy Orientation Orientation -Reviewed with patient: Available activity resources Strengths/Weaknesses Patient Strengths/Abilities: Willingness to participate;Active premorbidly Patient weaknesses: Physical limitations TR Patient demonstrates impairments in the following area(s):  Edema;Endurance;Motor;Pain;Skin  Integrity  Plan Rec Therapy Plan Is patient appropriate for Therapeutic Recreation?: Yes Rehab Potential: Good Treatment times per week: Min 1 TR session/group during LOS >20 minutes Estimated Length of Stay: 10 days TR Treatment/Interventions: Adaptive equipment instruction;1:1 session;Balance/vestibular training;Functional mobility training;Community reintegration;Group participation (Comment);Patient/family education;Therapeutic activities;Recreation/leisure participation;Therapeutic exercise;UE/LE Coordination activities  Recommendations for other services: None   Discharge Criteria: Patient will be discharged from TR if patient refuses treatment 3 consecutive times without medical reason.  If treatment goals not met, if there is a change in medical status, if patient makes no progress towards goals or if patient is discharged from hospital.  The above assessment, treatment plan, treatment alternatives and goals were discussed and mutually agreed upon: by patient  [place] 03/27/2017, 3:36 PM

## 2017-03-27 NOTE — Progress Notes (Signed)
[name] PHYSICAL MEDICINE & REHABILITATION     PROGRESS NOTE  Subjective/Complaints:  Pt seen laying in bed this AM.  She slept well overnight and is excited to take a shower finally.   ROS: Denies CP, SOB, N/V/D.  Objective: Vital Signs: Blood pressure (!) 118/51, pulse 87, temperature 98.6 F (37 C), temperature source Oral, resp. rate 18, height 5\' 3"  (1.6 m), weight 104.8 kg (231 lb 0.7 oz), SpO2 98 %. No results found.  Recent Labs  03/26/17 0853 03/27/17 0511  WBC 15.5* 15.0*  HGB 9.8* 8.9*  HCT 31.1* 28.5*  PLT 439* 456*    Recent Labs  03/25/17 0513 03/27/17 0511  NA 141 141  K 3.4* 3.7  CL 104 106  GLUCOSE 100* 103*  BUN 12 16  CREATININE 0.99 1.03*  CALCIUM 8.6* 8.1*   CBG (last 3)  No results for input(s): GLUCAP in the last 72 hours.  Wt Readings from Last 3 Encounters:  03/24/17 104.8 kg (231 lb 0.7 oz)  03/16/17 104.8 kg (231 lb)  02/28/17 104.8 kg (231 lb)    Physical Exam:  BP (!) 118/51 (BP Location: Left Arm)   Pulse 87   Temp 98.6 F (37 C) (Oral)   Resp 18   Ht 5\' 3"  (1.6 m)   Wt 104.8 kg (231 lb 0.7 oz)   SpO2 98%   BMI 40.93 kg/m  Gen: NAD. Obese. Head: Normocephalic and atraumatic.  Eyes: EOMI. No discharge.  Cardiovascular: RRR.  No JVD. Respiratory: Effort normal. No stridor.  GI: Soft. There is tenderness.  Musculoskeletal: She exhibits edema. She exhibits no tenderness.  Neurological: She is alert and oriented Motor: B/l UE motor 5/5.  LLE: 4+/5 HF, HF, 5/5 ADF/PF.  No sensory deficits.   Skin: Skin is warm and dry.  +Abdominal wound with dressing c/d/i  Left knee incision clean,dry, intact.  Psychiatric: She has a normal mood and affect. Her behavior is normal. Thought content normal.   Assessment/Plan: 1. Functional deficits secondary to recent left TKA and debility after perforated colon which require 3+ hours per day of interdisciplinary therapy in a comprehensive inpatient rehab setting. Physiatrist is  providing close team supervision and 24 hour management of active medical problems listed below. Physiatrist and rehab team continue to assess barriers to discharge/monitor patient progress toward functional and medical goals.  Function:  Bathing Bathing position Bathing activity did not occur: Refused Position: Wheelchair/chair at sink  Bathing parts Body parts bathed by patient: Right arm, Left arm, Chest, Abdomen, Front perineal area, Right upper leg, Left upper leg, Left lower leg, Back, Right lower leg Body parts bathed by helper: Back, Buttocks  Bathing assist Assist Level: Touching or steadying assistance(Pt > 75%)      Upper Body Dressing/Undressing Upper body dressing   What is the patient wearing?: Pull over shirt/dress     Pull over shirt/dress - Perfomed by patient: Thread/unthread right sleeve, Thread/unthread left sleeve, Pull shirt over trunk, Put head through opening          Upper body assist Assist Level: Supervision or verbal cues      Lower Body Dressing/Undressing Lower body dressing   What is the patient wearing?: Pants, Non-skid slipper socks Underwear - Performed by patient: Thread/unthread right underwear leg, Thread/unthread left underwear leg, Pull underwear up/down   Pants- Performed by patient: Thread/unthread right pants leg, Thread/unthread left pants leg, Pull pants up/down   Non-skid slipper socks- Performed by patient: Don/doff right sock, Don/doff left sock  Lower body assist Assist for lower body dressing: Supervision or verbal cues, Assistive device Assistive Device Comment: reacher, sock aide    Toileting Toileting   Toileting steps completed by patient: Adjust clothing prior to toileting, Performs perineal hygiene, Adjust clothing after toileting Toileting steps completed by helper: Adjust clothing prior to toileting, Performs perineal hygiene, Adjust clothing after toileting Toileting Assistive Devices: Grab  bar or rail  Toileting assist Assist level: Supervision or verbal cues   Transfers Chair/bed transfer   Chair/bed transfer method: Ambulatory Chair/bed transfer assist level: Supervision or verbal cues Chair/bed transfer assistive device: Armrests, Medical sales representative     Max distance: >165ft Assist level: Supervision or verbal cues   Wheelchair Wheelchair activity did not occur: N/A        Cognition Comprehension Comprehension assist level: Understands basic 90% of the time/cues < 10% of the time  Expression Expression assist level: Expresses complex ideas: With extra time/assistive device  Social Interaction Social Interaction assist level: Interacts appropriately with others - No medications needed.  Problem Solving Problem solving assist level: Solves basic problems with no assist  Memory Memory assist level: Complete Independence: No helper    Medical Problem List and Plan: 1.  Functional and mobility deficits secondary to recent left TKA and debility after perforated colon  Cont CIR  2.  DVT Prophylaxis/Anticoagulation: Pharmaceutical: Lovenox 3. Pain Management: continue oxycodone prn. 4. Mood: team to provide ego support. LCSW to follow for evaluation and support. Added protein supplement to promote wound healing.  5. Neuropsych: This patient is capable of making decisions on her own behalf. 6. Skin/Wound Care: routine pressure relief measures. Maintain adequate nutritional and hydration status.              -continue vac             -stoma/ostomy care per [place] RN 7. Fluids/Electrolytes/Nutrition:Monitor I/O.  8. Leucocytosis/FUO: Monitor CBC serially  Augmentin D #5/10  WBCs 15.0 on 5/29  Afebrile 9. HTN: Monitor BP bid. On benazepril daily.  Controlled 5/29 10.   ABLA:  Start iron supplement.    Hb 8.9 on 5/29 11. Hypokalemia  K+ 3.7 on 5/29  Cont to monitor  LOS (Days) 4 A FACE TO FACE EVALUATION WAS PERFORMED  Karmela Bram Lorie Phenix 03/27/2017  8:54 AM

## 2017-03-28 ENCOUNTER — Inpatient Hospital Stay (HOSPITAL_COMMUNITY): Payer: 59 | Admitting: Occupational Therapy

## 2017-03-28 ENCOUNTER — Inpatient Hospital Stay (HOSPITAL_COMMUNITY): Payer: 59

## 2017-03-28 ENCOUNTER — Inpatient Hospital Stay (HOSPITAL_COMMUNITY): Payer: 59 | Admitting: Physical Therapy

## 2017-03-28 DIAGNOSIS — R509 Fever, unspecified: Secondary | ICD-10-CM

## 2017-03-28 DIAGNOSIS — R5381 Other malaise: Secondary | ICD-10-CM

## 2017-03-28 LAB — CBC WITH DIFFERENTIAL/PLATELET
Basophils Absolute: 0 10*3/uL (ref 0.0–0.1)
Basophils Relative: 0 %
Eosinophils Absolute: 0.2 10*3/uL (ref 0.0–0.7)
Eosinophils Relative: 1 %
HEMATOCRIT: 27.1 % — AB (ref 36.0–46.0)
Hemoglobin: 8.6 g/dL — ABNORMAL LOW (ref 12.0–15.0)
LYMPHS PCT: 14 %
Lymphs Abs: 1.6 10*3/uL (ref 0.7–4.0)
MCH: 31.4 pg (ref 26.0–34.0)
MCHC: 31.7 g/dL (ref 30.0–36.0)
MCV: 98.9 fL (ref 78.0–100.0)
MONO ABS: 0.5 10*3/uL (ref 0.1–1.0)
Monocytes Relative: 4 %
NEUTROS ABS: 9.7 10*3/uL — AB (ref 1.7–7.7)
Neutrophils Relative %: 81 %
Platelets: 489 10*3/uL — ABNORMAL HIGH (ref 150–400)
RBC: 2.74 MIL/uL — ABNORMAL LOW (ref 3.87–5.11)
RDW: 14.7 % (ref 11.5–15.5)
WBC: 12 10*3/uL — ABNORMAL HIGH (ref 4.0–10.5)

## 2017-03-28 NOTE — Progress Notes (Signed)
[facility] PHYSICAL MEDICINE & REHABILITATION     PROGRESS NOTE  Subjective/Complaints:  Pt seen laying in bed this AM.  She slept well overnight.  She really enjoyed her shower yesterday.   ROS: Denies CP, SOB, N/V/D.  Objective: Vital Signs: Blood pressure 123/61, pulse 98, temperature 99.1 F (37.3 C), temperature source Oral, resp. rate 18, height 5\' 3"  (1.6 m), weight 103.3 kg (227 lb 11.8 oz), SpO2 94 %. No results found.  Recent Labs  03/27/17 0511 03/28/17 0456  WBC 15.0* 12.0*  HGB 8.9* 8.6*  HCT 28.5* 27.1*  PLT 456* 489*    Recent Labs  03/27/17 0511  NA 141  K 3.7  CL 106  GLUCOSE 103*  BUN 16  CREATININE 1.03*  CALCIUM 8.1*   CBG (last 3)  No results for input(s): GLUCAP in the last 72 hours.  Wt Readings from Last 3 Encounters:  03/28/17 103.3 kg (227 lb 11.8 oz)  03/16/17 104.8 kg (231 lb)  02/28/17 104.8 kg (231 lb)    Physical Exam:  BP 123/61 (BP Location: Left Arm)   Pulse 98   Temp 99.1 F (37.3 C) (Oral)   Resp 18   Ht 5\' 3"  (1.6 m)   Wt 103.3 kg (227 lb 11.8 oz)   SpO2 94%   BMI 40.34 kg/m  Gen: NAD. Obese. Head: Normocephalic and atraumatic.  Eyes: EOMI. No discharge.  Cardiovascular: RRR.  No JVD. Respiratory: Effort normal. No stridor.  GI: Soft. There is tenderness.  Musculoskeletal: She exhibits edema. She exhibits no tenderness.  Neurological: She is alert and oriented Motor: B/l UE motor 5/5.  LLE: 4+/5 HF, HF, 5/5 ADF/PF.  No sensory deficits.   Skin: Skin is warm and dry.  +Abdominal wound c/d/i.  Left knee incision clean,dry, intact.  Psychiatric: She has a normal mood and affect. Her behavior is normal. Thought content normal.   Assessment/Plan: 1. Functional deficits secondary to recent left TKA and debility after perforated colon which require 3+ hours per day of interdisciplinary therapy in a comprehensive inpatient rehab setting. Physiatrist is providing close team supervision and 24 hour management of  active medical problems listed below. Physiatrist and rehab team continue to assess barriers to discharge/monitor patient progress toward functional and medical goals.  Function:  Bathing Bathing position Bathing activity did not occur: Refused Position: Shower  Bathing parts Body parts bathed by patient: Right arm, Left arm, Chest, Abdomen, Front perineal area, Right upper leg, Left upper leg Body parts bathed by helper: Buttocks, Right lower leg, Left lower leg  Bathing assist Assist Level: Touching or steadying assistance(Pt > 75%)      Upper Body Dressing/Undressing Upper body dressing   What is the patient wearing?: Bra, Pull over shirt/dress Bra - Perfomed by patient: Thread/unthread right bra strap, Thread/unthread left bra strap, Hook/unhook bra (pull down sports bra)   Pull over shirt/dress - Perfomed by patient: Thread/unthread right sleeve, Thread/unthread left sleeve, Put head through opening, Pull shirt over trunk          Upper body assist Assist Level: Supervision or verbal cues      Lower Body Dressing/Undressing Lower body dressing   What is the patient wearing?: Pants, Underwear, Non-skid slipper socks Underwear - Performed by patient: Thread/unthread right underwear leg, Thread/unthread left underwear leg, Pull underwear up/down   Pants- Performed by patient: Thread/unthread right pants leg, Thread/unthread left pants leg, Pull pants up/down   Non-skid slipper socks- Performed by patient: Don/doff left sock, Don/doff right  sock                    Lower body assist Assist for lower body dressing: Supervision or verbal cues Assistive Device Comment: reacher, sock aide    Toileting Toileting   Toileting steps completed by patient: Adjust clothing prior to toileting, Adjust clothing after toileting, Performs perineal hygiene Toileting steps completed by helper: Adjust clothing prior to toileting, Performs perineal hygiene, Adjust clothing after  toileting Toileting Assistive Devices: Grab bar or rail  Toileting assist Assist level: Supervision or verbal cues   Transfers Chair/bed transfer   Chair/bed transfer method: Ambulatory Chair/bed transfer assist level: Touching or steadying assistance (Pt > 75%) Chair/bed transfer assistive device: Armrests, Walker, Librarian, academic     Max distance: 150 Assist level: Touching or steadying assistance (Pt > 75%)   Wheelchair Wheelchair activity did not occur: N/A        Cognition Comprehension Comprehension assist level: Understands basic 90% of the time/cues < 10% of the time  Expression Expression assist level: Expresses complex ideas: With extra time/assistive device  Social Interaction Social Interaction assist level: Interacts appropriately with others with medication or extra time (anti-anxiety, antidepressant).  Problem Solving Problem solving assist level: Solves basic problems with no assist  Memory Memory assist level: Complete Independence: No helper    Medical Problem List and Plan: 1.  Functional and mobility deficits secondary to recent left TKA and debility after perforated colon  Cont CIR  2.  DVT Prophylaxis/Anticoagulation: Pharmaceutical: Lovenox 3. Pain Management: continue oxycodone prn. 4. Mood: team to provide ego support. LCSW to follow for evaluation and support. Added protein supplement to promote wound healing.  5. Neuropsych: This patient is capable of making decisions on her own behalf. 6. Skin/Wound Care: routine pressure relief measures. Maintain adequate nutritional and hydration status.              -stoma/ostomy care per [place] RN 7. Fluids/Electrolytes/Nutrition:Monitor I/O.  8. Leucocytosis/FUO: Monitor CBC serially  Augmentin D #6/10  WBCs 12.0 on 5/30  Afebrile 9. HTN: Monitor BP bid. On benazepril daily.  Controlled 5/30 10.   ABLA:  Start iron supplement.    Hb 8.6 on 5/30 11. Hypokalemia  K+ 3.7 on 5/29  Cont to  monitor  LOS (Days) 5 A FACE TO FACE EVALUATION WAS PERFORMED  Lyris Hitchman Lorie Phenix 03/28/2017 8:44 AM

## 2017-03-28 NOTE — Progress Notes (Signed)
Social Work Patient ID: [name], female   DOB: 26-Jul-1961, 56 y.o.   MRN: [id number]  Met with pt and family members in the room to discuss team conference goals mod/i level and colostomy education.  Pt reports: " I will do what I need to do to be able to go home soon."  Will resume home health via [facility] and pt has all equipment. Dependent upon their education about moving up discharge sooner. She will follow up with MD once teaching is completed.

## 2017-03-28 NOTE — Progress Notes (Signed)
Physical Therapy Session Note  Patient Details  Name: [name] MRN: [id number] Date of Birth: Sep 21, 1961  Today's Date: 03/28/2017     Short Term Goals: Week 1:  PT Short Term Goal 1 (Week 1): =LTG due to estimated LOS  Skilled Therapeutic Interventions/Progress Updates:   PT attempted to see patient for PT treatment. RN and PA with patient for wound management. RN requested PT to return in 10 minutes to allow completion of wound care. PT returned after 10 minutes, and pt remained unavailable for PT due to PA and RN treatment of wound. PT will re-attempt at later time, schedule permitting.      Therapy Documentation Precautions:  Precautions Precautions: Knee, Fall Required Braces or Orthoses: Other Brace/Splint, Knee Immobilizer - Left Knee Immobilizer - Left: On when out of bed or walking Restrictions Weight Bearing Restrictions: Yes LLE Weight Bearing: Weight bearing as tolerated General:   missed PT time: 30 min. (nursing care)  Vital Signs: Therapy Vitals Temp: 99.1 F (37.3 C) Temp Source: Oral Pulse Rate: 98 Resp: 18 BP: 123/61 Patient Position (if appropriate): Lying Oxygen Therapy SpO2: 94 % O2 Device: Not Delivered   See Function Navigator for Current Functional Status.   Therapy/Group: Individual Therapy  Lorie Phenix 03/28/2017, 8:39 AM

## 2017-03-28 NOTE — Progress Notes (Signed)
Asked to see midline wound with WOC. VAC was removed Monday. Wound is well healing with good granulation tissue. Shallow tract inferiorly and slightly deeper tract superiorly.    WOC recommending BID dressing changes, agree with this. Call with questions or concerns. We will sign off at this time.  Brigid Re , [facility] Surgery 03/28/2017, 11:46 AM Pager: [phone] Consults: [phone] Mon-Fri 7:00 am-4:30 pm Sat-Sun 7:00 am-11:30 am

## 2017-03-28 NOTE — Consult Note (Signed)
WOC follow up with [facility] staff.  I recommended that the NPWT VAC dressing be removed Monday.  See images from [facility] on their note today.  Wound is doing quite well and well granulated.  Patient reported excessive drainage but we are unclear if this was noted after she showered, with optishield on.  Today her dressing feels wet, she has just gotten out of the shower.   CCS ok with patient showering with no dressing, however the patient does not really want to do this.  I will instruct bedside nursing staff to remove dressing just before surgery and cover with dry ABD pad then  Redress just after shower.    Orders updated today.  [place] [facility] Nurse team will follow along with you for weekly wound assessments and continued ostomy support.  Please notify me of any acute changes in the wounds or any new areas of concerns [place] MSN, [place], CNS [phone]

## 2017-03-28 NOTE — Progress Notes (Signed)
Occupational Therapy Session Note  Patient Details  Name: [name] MRN: [id number] Date of Birth: [birth year]  Today's Date: 03/28/2017  Session 1 OT Individual Time: 1000-1100 OT Individual Time Calculation (min): 60 min   Session 2 OT Individual Time: 1345-1430 OT Individual Time Calculation (min): 45 min    Short Term Goals: Week 1:  OT Short Term Goal 1 (Week 1): STG = LTG d/t ELOS  Skilled Therapeutic Interventions/Progress Updates:    Session 1 OT treatment session focused on modified bathing/dressing, ostomy care, dc planning, and functional transfers. Pt supine in bed upon OT arrival with daughter present. Educated pt/daughter on where to find tub transfer bench as pt plans to get one independently. Daughter able to pull one up online to ensure correct features. Discussed OT goals with pt and daughter as well as pt's mental barriers to ostomy care. Pt became tearful with discussion and difficulty coping with temporary lifestyle changes. Utilize therapeutic use of self to encourage and comfort pt. Discussed having neuropsychologist come see pt and she was agreeable. OT emptied pt's ostomy bag for her, and challenged pt to assist with 1 step of ostomy care today-pt felt she could do this. Abdominal wound and ostomy covered using shower shields, then pt ambulated to transfer into walk-in shower with supervision. Pt requires assistance to wash buttocks 2/2 difficulty twisting 2/p abdominal surgery. Pt able to use LH sponge for further LB bathing. Pt ambulated out of shower w/ RW, sat EOB for UB dressing, then noted slight wetness of abdominal dressing. Pt returned to supine and OT informed RN of need to change dressing prior to donning pants. Pt left supine in bed awaiting RN with daughter present.   Session 2 OT treatment session focused on toileting, ostomy care, knee ROM, and functional ambulation. Pt reported feeling she was mentally prepared to empty her ostomy bag this afternoon.  Pt ambulated to bathroom w/ RW, transferred onto toilet, voided bladder, managed clothing, and completed peri-care supervision. OT described steps for emptying ostomy, then pt completed with set-up for supplies and min cues for technique. Pt successful with this and feels she has made a big step towards independence and returning home. Pt ambulated to therapy gym and completed knee ROM there-ex and obstacle course focused on knee flexion to step over medium size step. Pt returned to room and placed in CPM with ice on knee. Pt called sister and daughter to set up time to learn ostomy bag care- set for Friday 6/1 between 9-11 am.   Therapy Documentation Precautions:  Precautions Precautions: Knee, Fall Required Braces or Orthoses: Other Brace/Splint, Knee Immobilizer - Left Knee Immobilizer - Left: On when out of bed or walking Restrictions Weight Bearing Restrictions: No LLE Weight Bearing: Weight bearing as tolerated Pain: Pain Assessment Pain Assessment: No/denies pain  See Function Navigator for Current Functional Status.   Therapy/Group: Individual Therapy  Valma Cava 03/28/2017, 9:34 PM

## 2017-03-28 NOTE — Progress Notes (Signed)
Patient with T-100 last evening and IS recommended. She has had increase in drainage from wound--serous but only being changed daily.  Wound is healing well but has 2 cm tract at inferior aspect--will increase dressing changes to bid as expect increase in drainage after mobility.  Discussed with Janett Billow PA for [facility] who will follow up for input and any changes. She felt low grade fever likely due to PNA.   Leucocytosis has resolved and no respiratory symptoms reported. Will check follow up CXR today.

## 2017-03-28 NOTE — Progress Notes (Signed)
Physical Therapy Session Note  Patient Details  Name: [name] MRN: [id number] Date of Birth: July 06, 1961  Today's Date: 03/28/2017 PT Individual Time: 0900-1004 PT Individual Time Calculation (min): 64 min   Short Term Goals: Week 1:  PT Short Term Goal 1 (Week 1): =LTG due to estimated LOS  Skilled Therapeutic Interventions/Progress Updates:    no c/o pain initially, at end of session reporting some soreness in L medial knee.  Session focus on strengthening, balance, and activity tolerance through OTAGO program, kinetron, and high level ambulation.   Pt ambulates to and from therapy gym with [facility] with supervision.  Stair negotiation x12 steps with [facility] and close supervision, intermittent use of L hand rail to steady.  Pt completes 4 passes through obstacle course focus on stepping over/around/on obstacles and compliant surfaces.  Pt overall requires supervision with 1 instance on 1st pass of HHA to prevent LOB.    Pt completes 10 reps of OTAGO level A exercises focus on BLE strengthening and balance with min verbal cues for technique.  Kinetron at 20 cm/s 4 trials to fatigue from seated position for LE strengthening and activity tolerance.    Pt returned to room at end of session and positioned in bed with ice applied to L knee, call bell in reach and needs met.  Therapy Documentation Precautions:  Precautions Precautions: Knee, Fall Required Braces or Orthoses: Other Brace/Splint, Knee Immobilizer - Left Knee Immobilizer - Left: On when out of bed or walking Restrictions Weight Bearing Restrictions: Yes LLE Weight Bearing: Weight bearing as tolerated  See Function Navigator for Current Functional Status.   Therapy/Group: Individual Therapy  Earnest Conroy Penven-Crew 03/28/2017, 9:53 AM

## 2017-03-28 NOTE — Patient Care Conference (Signed)
Inpatient RehabilitationTeam Conference and Plan of Care Update Date: 03/28/2017   Time: 11:30 AM    Patient Name: [name]      Medical Record Number: [id number]  Date of Birth: Mar 14, 1961 Sex: Female         Room/Bed: 4M02C/4M02C-01 Payor Info: Payor: Theme park manager / Plan: [place] / Product Type: *No Product type* /    Admitting Diagnosis: l tkr  Admit Date/Time:  03/23/2017  3:10 PM Admission Comments: No comment available   Primary Diagnosis:  Debility Principal Problem: Debility  Patient Active Problem List   Diagnosis Date Noted  . Debility 03/28/2017  . FUO (fever of unknown origin)   . Leukocytosis   . Hypokalemia   . Acute blood loss anemia   . Benign essential HTN   . History of total left knee replacement   . Perforated bowel ([place]) 03/23/2017  . S/P total knee replacement using cement, left 03/12/2017 by Dr Noemi Chapel 03/19/2017  . Perforated diverticulum   . History of lymphoma   . Hyperglycemia   . Post-op pain   . Perforation of sigmoid colon due to diverticulitis 03/17/2017  . Primary localized osteoarthritis of left knee   . Hypothyroidism   . Hypertension   . Cancer ([place])   . OSTEOARTHRITIS, KNEE, RIGHT 03/30/2010    Expected Discharge Date: Expected Discharge Date: 04/02/17  Team Members Present: Physician leading conference: Dr. Delice Lesch Social Worker Present: Ovidio Kin, LCSW Nurse Present: Other (comment) Haywood Lasso Evans-RN) PT Present: Dwyane Dee, PT OT Present: Cherylynn Ridges, OT SLP Present: Weston Anna, SLP PPS Coordinator present : Daiva Nakayama, RN, CRRN     Current Status/Progress Goal Weekly Team Focus  Medical   Functional and mobility deficits secondary to debility after perforated colon and recent left TKA   Improve mobility, leukocytosis, BP  See above   Bowel/Bladder   continent of bladder; colostomy output 5/30  maintain bladder continence with min assist; regular colostomy output with min assist   assess bladder continence q shift and prn; empty contents of colostomy bag   Swallow/Nutrition/ Hydration             ADL's   Min A/supervision   Supervision/ Mod I  ostomy care, modified bathing/dressing, pt/family ed, knee ROM   Mobility   supervision overall  mod I, supervision in community and stairs  d/c planning, balance, gait with LRAD   Communication             Safety/Cognition/ Behavioral Observations            Pain   denied pain; c/o discomfort during AM dressing change  <3  assess pain q shift and prn   Skin   skin glue to L knee incision and OTA; abd wound midline, pink with significant drainage on old dressing (abd pad soaked and drainage leaked out of dressing onto pt's shirt----abnormal)  skin free from infection and breakdown  assess skin q shift and prn; wound care per order      *See Care Plan and progress notes for long and short-term goals.  Barriers to Discharge: Colostomy care, leukocytosis, ABLA, HTN    Possible Resolutions to Barriers:  Therapies, educations, follow labs, cont abx    Discharge Planning/Teaching Needs:  Home with husband who is taking a FMLA to assist with her care. He is here daily.      Team Discussion:  Goals mod/i-MD reports fevers controlled, still watching white counts. And BP controlled. WOC-RN  working  on teaching of colostomy education. Husband, daughter's and pt to learn colostomy care  Revisions to Treatment Plan:  DC 6/4   Continued Need for Acute Rehabilitation Level of Care: The patient requires daily medical management by a physician with specialized training in physical medicine and rehabilitation for the following conditions: Daily direction of a multidisciplinary physical rehabilitation program to ensure safe treatment while eliciting the highest outcome that is of practical value to the patient.: Yes Daily medical management of patient stability for increased activity during participation in an intensive  rehabilitation regime.: Yes Daily analysis of laboratory values and/or radiology reports with any subsequent need for medication adjustment of medical intervention for : Post surgical problems;Wound care problems;Blood pressure problems  Shirleen Mcfaul, Gardiner Rhyme 03/28/2017, 1:25 PM

## 2017-03-29 ENCOUNTER — Inpatient Hospital Stay (HOSPITAL_COMMUNITY): Payer: 59 | Admitting: *Deleted

## 2017-03-29 ENCOUNTER — Inpatient Hospital Stay (HOSPITAL_COMMUNITY): Payer: 59 | Admitting: Physical Therapy

## 2017-03-29 ENCOUNTER — Inpatient Hospital Stay (HOSPITAL_COMMUNITY): Payer: 59 | Admitting: Occupational Therapy

## 2017-03-29 DIAGNOSIS — R5081 Fever presenting with conditions classified elsewhere: Secondary | ICD-10-CM

## 2017-03-29 DIAGNOSIS — K5780 Diverticulitis of intestine, part unspecified, with perforation and abscess without bleeding: Secondary | ICD-10-CM

## 2017-03-29 DIAGNOSIS — R509 Fever, unspecified: Secondary | ICD-10-CM

## 2017-03-29 DIAGNOSIS — K5720 Diverticulitis of large intestine with perforation and abscess without bleeding: Secondary | ICD-10-CM

## 2017-03-29 LAB — CBC WITH DIFFERENTIAL/PLATELET
Basophils Absolute: 0.1 10*3/uL (ref 0.0–0.1)
Basophils Relative: 1 %
EOS ABS: 0.2 10*3/uL (ref 0.0–0.7)
Eosinophils Relative: 2 %
HEMATOCRIT: 28.9 % — AB (ref 36.0–46.0)
HEMOGLOBIN: 8.9 g/dL — AB (ref 12.0–15.0)
LYMPHS ABS: 1.6 10*3/uL (ref 0.7–4.0)
LYMPHS PCT: 16 %
MCH: 30.4 pg (ref 26.0–34.0)
MCHC: 30.8 g/dL (ref 30.0–36.0)
MCV: 98.6 fL (ref 78.0–100.0)
Monocytes Absolute: 0.7 10*3/uL (ref 0.1–1.0)
Monocytes Relative: 6 %
NEUTROS PCT: 75 %
Neutro Abs: 7.6 10*3/uL (ref 1.7–7.7)
Platelets: 534 10*3/uL — ABNORMAL HIGH (ref 150–400)
RBC: 2.93 MIL/uL — AB (ref 3.87–5.11)
RDW: 14.5 % (ref 11.5–15.5)
WBC: 10.1 10*3/uL (ref 4.0–10.5)

## 2017-03-29 LAB — BASIC METABOLIC PANEL
Anion gap: 8 (ref 5–15)
BUN: 18 mg/dL (ref 6–20)
CHLORIDE: 109 mmol/L (ref 101–111)
CO2: 26 mmol/L (ref 22–32)
CREATININE: 0.93 mg/dL (ref 0.44–1.00)
Calcium: 8.2 mg/dL — ABNORMAL LOW (ref 8.9–10.3)
GFR calc non Af Amer: >60 mL/min (ref >60)
Glucose, Bld: 97 mg/dL (ref 65–99)
POTASSIUM: 4.1 mmol/L (ref 3.5–5.1)
SODIUM: 143 mmol/L (ref 135–145)

## 2017-03-29 NOTE — Plan of Care (Signed)
Problem: RH BOWEL ELIMINATION Goal: RH STG MANAGE BOWEL WITH ASSISTANCE STG Manage Bowel with Assistance.  Outcome: Progressing Ostomy with small brown soft stool  Problem: RH BLADDER ELIMINATION Goal: RH STG MANAGE BLADDER WITH ASSISTANCE STG Manage Bladder With Min Assistance   Outcome: Progressing Continent of bladder using the toilet with moderate assistance of one person  Problem: RH SKIN INTEGRITY Goal: RH STG MAINTAIN SKIN INTEGRITY WITH ASSISTANCE STG Maintain Skin Integrity With [place].   Outcome: Progressing No drainage noted this shift from abdominal incision Goal: RH STG ABLE TO PERFORM INCISION/WOUND CARE W/ASSISTANCE STG Able To Perform Incision/Wound Care With [employer].   Outcome: Progressing Patient stated that she emptied her ostomy  Problem: RH SAFETY Goal: RH OTHER STG SAFETY GOALS W/ASSIST Other STG Safety Goals With Assistance.  Outcome: Progressing Safety precautions maintained, no safety issues noted  Problem: RH PAIN MANAGEMENT Goal: RH OTHER STG PAIN MANAGEMENT GOALS W/ASSIST Other STG Pain Management Goals With min Assistance.   Outcome: Progressing Medicated once this shift for severe headache with full relief

## 2017-03-29 NOTE — Discharge Summary (Signed)
Physician Discharge Summary  Patient ID: [name] MRN: [id number] DOB/AGE: [birth year] 56 y.o.  Admit date: 03/23/2017 Discharge date: 03/30/2017  Discharge Diagnoses:  Principal Problem:   Debility Active Problems:   Perforation of sigmoid colon due to diverticulitis   Leukocytosis   Hypokalemia   Acute blood loss anemia   Benign essential HTN   History of total left knee replacement   FUO (fever of unknown origin)   Fever   Pneumonitis   Discharged Condition: stable   Significant Diagnostic Studies:  Dg Chest 2 View  Result Date: 03/28/2017 CLINICAL DATA:  Fever EXAM: CHEST  2 VIEW COMPARISON:  Mar 22, 2017. FINDINGS: There has been clearing of most of the infiltrate seen recently in the right lower lobe. Slight atelectatic change remains in the right lower lobe. Lungs elsewhere are clear. Heart size and pulmonary vascularity are normal. No adenopathy. There is aortic atherosclerosis. There is degenerative change in the lower thoracic region. There are surgical clips in the right axillary region. IMPRESSION: Most of the infiltrate on the right is clear with mild atelectasis remaining in the right lower lobe. No new opacity. Staples cardiac silhouette. There is aortic atherosclerosis. Electronically Signed   By: Lowella Grip III M.D.   On: 03/28/2017 15:08   Ct Abdomen Pelvis W Contrast  Result Date: 03/23/2017 CLINICAL DATA:  Leukocytosis EXAM: CT ABDOMEN AND PELVIS WITH CONTRAST TECHNIQUE: Multidetector CT imaging of the abdomen and pelvis was performed using the standard protocol following bolus administration of intravenous contrast. CONTRAST:  42mL ISOVUE-300 IOPAMIDOL (ISOVUE-300) INJECTION 61% COMPARISON:  03/17/2017 FINDINGS: Lower chest: Small bilateral pleural effusions. Dependent atelectasis. Heart is normal size. Hepatobiliary: No focal hepatic abnormality. Gallbladder unremarkable. Pancreas: No focal abnormality or ductal dilatation. Spleen: No focal abnormality.   Normal size. Adrenals/Urinary Tract: Large parapelvic cyst in the right kidney centrally. Smaller parapelvic cysts on the left. No hydronephrosis. Small scattered cortical cysts. Adrenal glands and urinary bladder are unremarkable. Punctate nonobstructing stones in the mid and upper pole of the right kidney. Stomach/Bowel: Postoperative changes in the colon with Hartmann's pouch and left lower quadrant colostomy. No evidence of bowel obstruction. Stomach and small bowel decompressed, unremarkable. Vascular/Lymphatic: Diffuse aortic and iliac calcifications. No aneurysm or adenopathy. Calcified right aortocaval lymph nodes, stable. Reproductive: Uterus and adnexa unremarkable.  No mass. Other: Postoperative changes with fluid, stranding and gas locules in the anterior abdominal wall in the midline. Small amount of free fluid in the left paracolic gutter. No free air. Musculoskeletal: No acute bony abnormality. IMPRESSION: Postoperative changes with left lower quadrant colostomy. Stranding, fluid and gas within the anterior abdominal wall compatible with recent postoperative change. Small amount of free fluid in the left paracolic gutter. Bilateral renal parapelvic and cortical cysts.  No hydronephrosis. Right nephrolithiasis. Trace bilateral pleural effusions. Electronically Signed   By: Rolm Baptise M.D.   On: 03/23/2017 10:57    Dg Chest Port 1 View  Result Date: 03/22/2017 CLINICAL DATA:  Leukocytosis . EXAM: PORTABLE CHEST 1 VIEW COMPARISON:  03/17/2017 . FINDINGS: Mediastinum hilar structures are stable. Heart size stable. Mild basilar interstitial prominence suggesting mild pneumonitis. Persistent bibasilar atelectasis, particular on the right. No free air in the hemidiaphragm noted on today's exam. IMPRESSION: 1. Mild bibasilar interstitial prominence. Mild pneumonitis cannot be excluded. 2.  Mild bibasilar atelectasis, particular on the right. Electronically Signed   By: Marcello Moores  Register   On: 03/22/2017  10:03    Labs:  Basic Metabolic Panel:  Recent Labs Lab 03/27/17  0511 03/29/17 0541  NA 141 143  K 3.7 4.1  CL 106 109  CO2 27 26  GLUCOSE 103* 97  BUN 16 18  CREATININE 1.03* 0.93  CALCIUM 8.1* 8.2*    CBC:  Recent Labs Lab 03/28/17 0456 03/29/17 0541 03/30/17 0847  WBC 12.0* 10.1 9.8  NEUTROABS 9.7* 7.6 7.2  HGB 8.6* 8.9* 9.8*  HCT 27.1* 28.9* 32.1*  MCV 98.9 98.6 99.7  PLT 489* 534* 720*    CBG: No results for input(s): GLUCAP in the last 168 hours.  Brief HPI:   [name] a 56 y.o.femalewith history of lymphoma, L-TKR 5/14 who was discharged to home but readmitted on 5/19 with LLQ pain due to perforated diverticulitis. History taken from chart review and husband. She underwent sigmoid colectomy with diverting colostomy by Dr. Georgette Dover. Post op on IV antibiotics and diet has slowly been advanced to soft. Wound VAC in place and being changed MWF by WOC. She has had persistent leucocytosis and spiked temp 101 on 5/22 therefore started on Vanc/Zosyn and had defervesced. CXR showed evidence of right pneumonitis question PNA and antibiotic narrowed to Augmentin X 10 days.. She is currently limited by pain with inability with deficits in mobility. CIR recommended for follow up therapy.    Hospital Course: [name] was admitted to rehab 03/23/2017 for inpatient therapies to consist of PT and OT at least three hours five days a week. Past admission physiatrist, therapy team and rehab RN have worked together to provide customized collaborative inpatient rehab. Mood has been stable and her endurance levels have improved. Po intake has improved and VAC was discontinued on 5/28 with bid dressing changes. Track at proximal and inferior aspect of wound are closely in with healthy granulation tissue in wound bed. Her blood pressures have been stable on benazepril daily.  She is tolerating Augmentin without side effects and is to completed 10 day course for treatment of  pneumonitis. Follow up CBC showed that reactive leucocytosis has resolved. Respiratory status has been stable and no signs of infection noted. Check of lytes showed that hypokalemia has resolved with increase in intake. She was started on iron supplement for ABLA and follow up labs show improvement in H/H. Her pain has been managed with use of tylenol on prn basis.  She has made great progress during her rehab stay and is modified independent at discharge. Family education was completed regarding all aspects of care, diet, dressing changes and safety. She will continue to receive follow up [place], [place] and [place] by    Rehab course: During patient's stay in rehab weekly team conferences were held to monitor patient's progress, set goals and discuss barriers to discharge. At admission, patient required min assist with mobility and basic self care tasks. She has had improvement in activity tolerance, balance, postural control, as well as ability to compensate for deficits. She is able to complete ADL tasks at modified independent level. She is modified independent for transfers and is able to ambulate 150' with SBQC at modified independent level.     Disposition: 01-Home or Self Care  Diet: Soft/low residue foods.   Wound care: Twice a day cleanse wound with normal saline. Apply damp to dry dressing--pack lower tract with 1" gauze and cover with dry gauze and ABD pad.   Special Instructions: 1. No driving or strenuous activity till cleared by MD. 2. Keep a food diary.    Allergies as of 03/30/2017      Reactions  Iodine-131 Nausea And Vomiting   Cephalexin Rash   Codeine Itching, Nausea Only   Contrast Media [iodinated Diagnostic Agents] Nausea And Vomiting      Medication List    STOP taking these medications   docusate sodium 100 MG capsule Commonly known as:  COLACE   HYDROmorphone 2 MG tablet Commonly known as:  DILAUDID   TURMERIC PO     TAKE these medications   acetaminophen 325  MG tablet Commonly known as:  TYLENOL Take 1-2 tablets (325-650 mg total) by mouth every 6 (six) hours as needed for mild pain. What changed:  how much to take  reasons to take this   amLODipine 10 MG tablet Commonly known as:  NORVASC Take 1 tablet (10 mg total) by mouth every evening.   amoxicillin-clavulanate 875-125 MG tablet Commonly known as:  AUGMENTIN Take 1 tablet by mouth every 12 (twelve) hours.   aspirin EC 81 MG tablet Take 1 tablet (81 mg total) by mouth daily. What changed:  medication strength  how much to take  how to take this  when to take this  additional instructions   benazepril 20 MG tablet Commonly known as:  LOTENSIN Take 1 tablet (20 mg total) by mouth daily.   diphenhydrAMINE 25 mg capsule Commonly known as:  BENADRYL Take 25 mg by mouth at bedtime.   iron polysaccharides 150 MG capsule Commonly known as:  NIFEREX Take 1 capsule (150 mg total) by mouth 2 (two) times daily before lunch and supper.   levothyroxine 88 MCG tablet Commonly known as:  SYNTHROID, LEVOTHROID Take 88 mcg by mouth daily before breakfast.   loratadine 10 MG tablet Commonly known as:  CLARITIN Take 10 mg by mouth daily.   methocarbamol 500 MG tablet Commonly known as:  ROBAXIN Take 0.5-1 tablets (250-500 mg total) by mouth every 8 (eight) hours as needed for muscle spasms.   multivitamin Tabs tablet Take 1 tablet by mouth daily.   ondansetron 4 MG disintegrating tablet Commonly known as:  ZOFRAN-ODT Take 1 tablet (4 mg total) by mouth every 6 (six) hours as needed for nausea.   PHILLIPS COLON HEALTH PO Take 1 tablet by mouth daily.   polyethylene glycol packet Commonly known as:  MIRALAX / GLYCOLAX 17grams in 16 oz of water 1-2 times a day to keep stool liquid/semi liquid conistency What changed:  additional instructions   pravastatin 20 MG tablet Commonly known as:  PRAVACHOL Take 1 tablet (20 mg total) by mouth every evening.      Follow-up  Information    Donnie Mesa, MD. Call.   Specialty:  General Surgery Why:  Call to make an appointment with Dr. Georgette Dover 3-4 weeks from the date of your surgery (03/17/17), or when you get out of rehab.  Contact information: [address] [place] [address] [phone]        Charlett Blake, MD Follow up.   Specialty:  Physical Medicine and Rehabilitation Why:  call as needed Contact information: [place] [address] [phone]        Sharilyn Sites, MD Follow up on 04/03/2017.   Specialty:  Family Medicine Why:  Be there at 1:30 pm  for  appointment  (Appointment with Dr. Gerarda Fraction as Dr. Hilma Favors off next week) Contact information: [place] [address] [phone]        Elsie Saas, MD. Call in 1 day(s).   Specialty:  Orthopedic Surgery Why:  for  post op check.  Contact information: [address] [place] [address] [phone]           Signed: Bary Leriche 04/02/2017, 5:27 PM

## 2017-03-29 NOTE — Progress Notes (Signed)
[facility]IC[facility] RE[facility]ABILITATION     PROGRESS NOTE  Subjective/Complaints:  Pt seen sitting up in bed t his AM.  She slept "wonderful" overnight.  She is enjoying therapies.   ROS: Denies CP, SOB, N/V/D.  Objective: Vital Signs: Blood pressure (!) 117/50, pulse 92, temperature 98.2 F (36.8 C), temperature source Oral, resp. rate 18, height 5\' 3"  (1.6 m), weight 103.3 kg (227 lb 11.8 oz), SpO2 100 %. Dg Chest 2 View  Result Date: 03/28/2017 CLINICAL DATA:  Fever EXAM: CHEST  2 VIEW COMPARISON:  Mar 22, 2017. FINDINGS: There has been clearing of most of the infiltrate seen recently in the right lower lobe. Slight atelectatic change remains in the right lower lobe. Lungs elsewhere are clear. Heart size and pulmonary vascularity are normal. No adenopathy. There is aortic atherosclerosis. There is degenerative change in the lower thoracic region. There are surgical clips in the right axillary region. IMPRESSION: Most of the infiltrate on the right is clear with mild atelectasis remaining in the right lower lobe. No new opacity. Staples cardiac silhouette. There is aortic atherosclerosis. Electronically Signed   By: Lowella Grip III M.D.   On: 03/28/2017 15:08    Recent Labs  03/28/17 0456 03/29/17 0541  WBC 12.0* 10.1  HGB 8.6* 8.9*  HCT 27.1* 28.9*  PLT 489* 534*    Recent Labs  03/27/17 0511 03/29/17 0541  NA 141 143  K 3.7 4.1  CL 106 109  GLUCOSE 103* 97  BUN 16 18  CREATININE 1.03* 0.93  CALCIUM 8.1* 8.2*   CBG (last 3)  No results for input(s): GLUCAP in the last 72 hours.  Wt Readings from Last 3 Encounters:  03/28/17 103.3 kg (227 lb 11.8 oz)  03/16/17 104.8 kg (231 lb)  02/28/17 104.8 kg (231 lb)    Physical Exam:  BP (!) 117/50 (BP Location: Left Arm)   Pulse 92   Temp 98.2 F (36.8 C) (Oral)   Resp 18   Ht 5\' 3"  (1.6 m)   Wt 103.3 kg (227 lb 11.8 oz)   SpO2 100%   BMI 40.34 kg/m  Gen: NAD. Obese. Head: Normocephalic and atraumatic.   Eyes: EOMI. No discharge.  Cardiovascular: RRR.  No JVD. Respiratory: Effort normal. No stridor.  GI: Soft. There is tenderness.  Musculoskeletal: She exhibits edema. She exhibits no tenderness. ROM improving on CPM. Neurological: She is alert and oriented Motor: B/l UE motor 5/5.  LLE: 4+/5 HF, HF, 5/5 ADF/PF (stable).  No sensory deficits.   Skin: Skin is warm and dry.  +Abdominal wound with dressing c/d/i.  Left knee incision clean,dry, intact.  Psychiatric: She has a normal mood and affect. Her behavior is normal. Thought content normal.   Assessment/Plan: 1. Functional deficits secondary to recent left TKA and debility after perforated colon which require 3+ hours per day of interdisciplinary therapy in a comprehensive inpatient rehab setting. Physiatrist is providing close team supervision and 24 hour management of active medical problems listed below. Physiatrist and rehab team continue to assess barriers to discharge/monitor patient progress toward functional and medical goals.  Function:  Bathing Bathing position Bathing activity did not occur: Refused Position: Shower  Bathing parts Body parts bathed by patient: Right arm, Left arm, Chest, Abdomen, Front perineal area, Right upper leg, Left upper leg, Right lower leg, Left lower leg Body parts bathed by helper: Buttocks  Bathing assist Assist Level: Touching or steadying assistance(Pt > 75%)      Upper Body Dressing/Undressing  Upper body dressing   What is the patient wearing?: Pull over shirt/dress, Bra Bra - Perfomed by patient: Thread/unthread left bra strap, Hook/unhook bra (pull down sports bra), Thread/unthread right bra strap   Pull over shirt/dress - Perfomed by patient: Thread/unthread right sleeve, Put head through opening, Pull shirt over trunk, Thread/unthread left sleeve          Upper body assist Assist Level: Set up   Set up : To obtain clothing/put away  Lower Body Dressing/Undressing Lower body  dressing   What is the patient wearing?: Underwear Underwear - Performed by patient: Thread/unthread right underwear leg, Pull underwear up/down, Thread/unthread left underwear leg   Pants- Performed by patient: Thread/unthread right pants leg, Thread/unthread left pants leg, Pull pants up/down   Non-skid slipper socks- Performed by patient: Don/doff left sock, Don/doff right sock                    Lower body assist Assist for lower body dressing: Supervision or verbal cues Assistive Device Comment: reacher, sock aide    Toileting Toileting   Toileting steps completed by patient: Adjust clothing prior to toileting, Performs perineal hygiene, Adjust clothing after toileting Toileting steps completed by helper: Adjust clothing prior to toileting, Performs perineal hygiene, Adjust clothing after toileting Toileting Assistive Devices: Grab bar or rail  Toileting assist Assist level: Supervision or verbal cues   Transfers Chair/bed transfer   Chair/bed transfer method: Ambulatory Chair/bed transfer assist level: Supervision or verbal cues Chair/bed transfer assistive device: Armrests, Librarian, academic     Max distance: 150 Assist level: Supervision or verbal cues   Wheelchair Wheelchair activity did not occur: N/A        Cognition Comprehension Comprehension assist level: Follows basic conversation/direction with extra time/assistive device  Expression Expression assist level: Expresses complex ideas: With extra time/assistive device  Social Interaction Social Interaction assist level: Interacts appropriately with others with medication or extra time (anti-anxiety, antidepressant).  Problem Solving Problem solving assist level: Solves complex problems: With extra time  Memory Memory assist level: Complete Independence: No helper    Medical Problem List and Plan: 1.  Functional and mobility deficits secondary to recent left TKA and debility after  perforated colon  Cont CIR  2.  DVT Prophylaxis/Anticoagulation: Pharmaceutical: Lovenox 3. Pain Management: continue oxycodone prn. 4. Mood: team to provide ego support. LCSW to follow for evaluation and support. Added protein supplement to promote wound healing.  5. Neuropsych: This patient is capable of making decisions on her own behalf. 6. Skin/Wound Care: routine pressure relief measures. Maintain adequate nutritional and hydration status.   stoma/ostomy care per [place] RN  Discussed wound care with surg, appreciate recs - BID changes 7. Fluids/Electrolytes/Nutrition:Monitor I/O.  8. Leucocytosis/FUO: Monitor CBC serially  Augmentin D #7/10  WBCs 10.1 on 5/31  Afebrile  CXR reviewed, showing improvement in infiltrate 9. HTN: Monitor BP bid. On benazepril daily.  Controlled 5/31 10.   ABLA:  Iron supplement.    Hb 8.9 on 5/31 11. Hypokalemia  K+ 4.1 on 5/31  Cont to monitor  LOS (Days) 6 A FACE TO FACE EVALUATION WAS PERFORMED  Latisa Belay Lorie Phenix 03/29/2017 7:50 AM

## 2017-03-29 NOTE — Progress Notes (Signed)
Physical Therapy Session Note  Patient Details  Name: TRENNA KIELY MRN: [id number] Date of Birth: 1961/08/09  Today's Date: 03/29/2017 PT Individual Time: 1300-1400 PT Individual Time Calculation (min): 60 min   Short Term Goals: Week 1:  PT Short Term Goal 1 (Week 1): =LTG due to estimated LOS  Skilled Therapeutic Interventions/Progress Updates:    no c/o pain.  Session focus on activity tolerance and LE ROM through mobility and LLE therex.    Pt ambulates throughout unit, max distance 300', with SBQC and mod I.  Car transfer and basic transfers with [facility] mod I. Stair negotiation with SBQC mod I.  PT instructed pt in LLE therex x12 reps LAQ with 3 second hold, seated hip flexion, heel slides, SLR, SAQ, and hip abd/add to midline with min verbal cues for technique.  Discussed printed HEP for d/c and pt states she would like one.  Pt returned to room at end of session call bell in reach and needs met.   Therapy Documentation Precautions:  Precautions Precautions: None Required Braces or Orthoses: Other Brace/Splint, Knee Immobilizer - Left Knee Immobilizer - Left: On when out of bed or walking Restrictions Weight Bearing Restrictions: No LLE Weight Bearing: Weight bearing as tolerated   See Function Navigator for Current Functional Status.   Therapy/Group: Individual Therapy  Resean Brander E Penven-Crew 03/29/2017, 3:00 PM

## 2017-03-29 NOTE — Progress Notes (Signed)
Occupational Therapy Session Note  Patient Details  Name: [name] MRN: [id number] Date of Birth: [birth year]  Today's Date: 03/29/2017 OT Individual Time: 0800-0900 OT Individual Time Calculation (min): 60 min    Short Term Goals: Week 1:  OT Short Term Goal 1 (Week 1): STG = LTG d/t ELOS   Skilled Therapeutic Interventions/Progress Updates:    Upon entering the room, pt supine in bed with no c/o pain. Pt declines shower but requests to wash and dress at sink. Pt ambulating from bed >bathroom with close supervision and use of quad cane. Pt performed clothing management and hygiene after toileting with overall supervision. Pt seated in chair at sink for bathing and dressing tasks. Pt needing min A for LB self care to don L sock. OT discussed use of sock aide in next session for practice. Pt ambulating 75' to ADL apartment with quad cane and close supervision. Pt taking seated rest break and OT educating pt on energy conservation with self care and community mobility. Pt verbalized understanding. Pt requesting pain medication from RN for L knee pain. Pt returning to room in same manner and seated in recliner chair with ice applied and RN arriving with medication. Call bell and all needed items within reach upon exiting the room.   Therapy Documentation Precautions:  Precautions Precautions: None Required Braces or Orthoses: Other Brace/Splint, Knee Immobilizer - Left Knee Immobilizer - Left: On when out of bed or walking Restrictions Weight Bearing Restrictions: No LLE Weight Bearing: Weight bearing as tolerated   Pain: Pain Assessment Pain Assessment: No/denies pain ADL:   Vision Baseline Vision/History: Wears glasses Wears Glasses: Reading only Patient Visual Report: No change from baseline Vision Assessment?: No apparent visual deficits Perception  Perception: Within Functional Limits Praxis Praxis: Intact Exercises:   Other Treatments:    See Function Navigator for  Current Functional Status.   Therapy/Group: Individual Therapy  Gypsy Decant 03/29/2017, 1:30 PM

## 2017-03-29 NOTE — Progress Notes (Signed)
Physical Therapy Discharge Summary  Patient Details  Name: [name] MRN: [id number] Date of Birth: January 05, 1961  Patient has met 10 of 10 long term goals due to improved activity tolerance, improved balance, improved postural control, increased strength, increased range of motion and decreased pain.  Patient to discharge at an ambulatory level Modified Independent.     Recommendation:  Patient will benefit from ongoing skilled PT services in home health setting to continue to advance safe functional mobility, address ongoing impairments in activity tolerance, ROM, strength, and balance, and minimize fall risk.  Equipment: No equipment provided  Reasons for discharge: treatment goals met  Patient/family agrees with progress made and goals achieved: Yes  PT Discharge Precautions/Restrictions Precautions Precautions: None Pain Pain Assessment Pain Assessment: No/denies pain Vision/Perception  Perception Perception: Within Functional Limits Praxis Praxis: Intact  Cognition Overall Cognitive Status: Within Functional Limits for tasks assessed Arousal/Alertness: Awake/alert Orientation Level: Oriented X4 Memory: Appears intact Awareness: Appears intact Problem Solving: Appears intact Safety/Judgment: Appears intact Sensation Sensation Light Touch: Appears Intact Coordination Gross Motor Movements are Fluid and Coordinated: Yes Motor  Motor Motor: Within Functional Limits  Mobility Bed Mobility Bed Mobility: Supine to Sit;Sit to Supine Supine to Sit: 6: Modified independent (Device/Increase time) Sit to Supine: 6: Modified independent (Device/Increase time) Transfers Transfers: Yes Sit to Stand: 6: Modified independent (Device/Increase time) Stand to Sit: 6: Modified independent (Device/Increase time) Locomotion  Ambulation Ambulation: Yes Ambulation/Gait Assistance: 6: Modified independent (Device/Increase time) Ambulation Distance (Feet): 150 Feet Assistive  device: Small based quad cane Gait Gait: Yes Gait Pattern: Step-through pattern;Antalgic Stairs / Additional Locomotion Stairs: Yes Stairs Assistance: 6: Modified independent (Device/Increase time) Stair Management Technique:  (2 steps with cane and no rail, 10 steps with cane and L rail) Number of Stairs: 12 Wheelchair Mobility Wheelchair Mobility: No  Trunk/Postural Assessment  Cervical Assessment Cervical Assessment: Within Functional Limits Thoracic Assessment Thoracic Assessment: Within Functional Limits Lumbar Assessment Lumbar Assessment: Within Functional Limits Postural Control Postural Control: Within Functional Limits  Balance Balance Balance Assessed: Yes Dynamic Sitting Balance Dynamic Sitting - Level of Assistance: 6: Modified independent (Device/Increase time) Dynamic Standing Balance Dynamic Standing - Balance Support: Right upper extremity supported;Left upper extremity supported;No upper extremity supported Dynamic Standing - Level of Assistance: 6: Modified independent (Device/Increase time) Extremity Assessment      RLE Assessment RLE Assessment: Within Functional Limits LLE Assessment LLE Assessment: Within Functional Limits   See Function Navigator for Current Functional Status.  [name] 03/29/2017, 3:00 PM

## 2017-03-29 NOTE — Progress Notes (Signed)
Occupational Therapy Note  Patient Details  Name: EVERLINE MAHAFFY MRN: [id number] Date of Birth: July 17, 1961  Today's Date: 03/29/2017 OT Concurrent Time: 1030-1200 OT Concurrent Time Calculation (min): 90 min  Pt denied pain Concurrent therapy   Skilled Therapeutic Interventions/Progress Updates: Pt participated in community reintegration/outing to [employer] at [employer] supervision ambulatory level using [facility].  Goals focused on safe community mobility, identification & negotiation of obstacles, accessing public restroom, energy conservation techniques/education.  See outing goal sheet in shadow chart for full details Leroy Libman 03/29/2017, 12:11 PM

## 2017-03-29 NOTE — Progress Notes (Signed)
Recreational Therapy Session Note  Patient Details  Name: KENISE BARRACO MRN: [id number] Date of Birth: 1961/10/17 Today's Date: 03/29/2017  Pain: no c/o Skilled Therapeutic Interventions/Progress Updates: Pt participated in community reintegration/outing to [employer] at [employer] supervision ambulatory level using [facility].  Goals focused on safe community mobility, identification & negotiation of obstacles, accessing public restroom, energy conservation techniques/education.  See outing goal sheet in shadow chart for full details.  No further TR as pt is discharging home tomorrow.  Goals met.   Therapy/Group: [employer] Janace Decker 03/29/2017, 3:11 PM

## 2017-03-29 NOTE — Progress Notes (Signed)
Social Work Patient ID: [name], female   DOB: 04/01/1961, 56 y.o.   MRN: [id number]  Team feels pt will be ready for discharge tomorrow once [facility] education completed. Pt wanting to go home sooner than Monday. She is very excited to be able to go home Friday. Will make referral for follow up-[facility]. Has all equipment from previous surgeries.

## 2017-03-30 ENCOUNTER — Inpatient Hospital Stay (HOSPITAL_COMMUNITY): Payer: 59 | Admitting: Occupational Therapy

## 2017-03-30 ENCOUNTER — Inpatient Hospital Stay (HOSPITAL_COMMUNITY): Payer: 59 | Admitting: Physical Therapy

## 2017-03-30 DIAGNOSIS — J189 Pneumonia, unspecified organism: Secondary | ICD-10-CM

## 2017-03-30 LAB — CBC WITH DIFFERENTIAL/PLATELET
Basophils Absolute: 0.1 10*3/uL (ref 0.0–0.1)
Basophils Relative: 1 %
Eosinophils Absolute: 0.2 10*3/uL (ref 0.0–0.7)
Eosinophils Relative: 2 %
HEMATOCRIT: 32.1 % — AB (ref 36.0–46.0)
HEMOGLOBIN: 9.8 g/dL — AB (ref 12.0–15.0)
LYMPHS PCT: 17 %
Lymphs Abs: 1.7 10*3/uL (ref 0.7–4.0)
MCH: 30.4 pg (ref 26.0–34.0)
MCHC: 30.5 g/dL (ref 30.0–36.0)
MCV: 99.7 fL (ref 78.0–100.0)
MONO ABS: 0.6 10*3/uL (ref 0.1–1.0)
MONOS PCT: 6 %
NEUTROS ABS: 7.2 10*3/uL (ref 1.7–7.7)
NEUTROS PCT: 74 %
Platelets: 720 10*3/uL — ABNORMAL HIGH (ref 150–400)
RBC: 3.22 MIL/uL — ABNORMAL LOW (ref 3.87–5.11)
RDW: 14.5 % (ref 11.5–15.5)
WBC: 9.8 10*3/uL (ref 4.0–10.5)

## 2017-03-30 MED ORDER — ACETAMINOPHEN 325 MG PO TABS: 325.0000 mg | ORAL_TABLET | Freq: Four times a day (QID) | ORAL | Status: DC | PRN

## 2017-03-30 MED ORDER — PRAVASTATIN SODIUM 20 MG PO TABS: 20.0000 mg | ORAL_TABLET | Freq: Every evening | ORAL | 0 refills | Status: DC

## 2017-03-30 MED ORDER — POLYETHYLENE GLYCOL 3350 17 G PO PACK: PACK | ORAL | 0 refills | Status: DC

## 2017-03-30 MED ORDER — ASPIRIN EC 81 MG PO TBEC: 81.0000 mg | DELAYED_RELEASE_TABLET | Freq: Every day | ORAL | 0 refills | Status: AC

## 2017-03-30 MED ORDER — BENAZEPRIL HCL 20 MG PO TABS: 20.0000 mg | ORAL_TABLET | Freq: Every day | ORAL | 0 refills | Status: DC

## 2017-03-30 MED ORDER — ONDANSETRON 4 MG PO TBDP: 4.0000 mg | ORAL_TABLET | Freq: Four times a day (QID) | ORAL | 0 refills | Status: DC | PRN

## 2017-03-30 MED ORDER — AMLODIPINE BESYLATE 10 MG PO TABS: 10.0000 mg | ORAL_TABLET | Freq: Every evening | ORAL | 0 refills | Status: AC

## 2017-03-30 MED ORDER — PROSIGHT PO TABS: 1.0000 | ORAL_TABLET | Freq: Every day | ORAL | 0 refills | Status: DC

## 2017-03-30 MED ORDER — POLYSACCHARIDE IRON COMPLEX 150 MG PO CAPS: 150.0000 mg | ORAL_CAPSULE | Freq: Two times a day (BID) | ORAL | 0 refills | Status: DC

## 2017-03-30 MED ORDER — AMOXICILLIN-POT CLAVULANATE 875-125 MG PO TABS: 1.0000 | ORAL_TABLET | Freq: Two times a day (BID) | ORAL | 0 refills | Status: DC

## 2017-03-30 MED ORDER — METHOCARBAMOL 500 MG PO TABS: 250.0000 mg | ORAL_TABLET | Freq: Three times a day (TID) | ORAL | 0 refills | Status: DC | PRN

## 2017-03-30 NOTE — Discharge Instructions (Signed)
Inpatient Rehab Discharge Instructions  [name] Discharge date and time:  03/30/17  Activities/Precautions/ Functional Status: Activity: no lifting, driving, or strenuous exercise  till cleared by MD Diet: regular diet   MIDLINE WOUND CARE: - midline dressing to be changed twice daily - supplies: sterile saline, kerlix, scissors, ABD pads, tape  - remove dressing and all packing carefully, moistening with sterile saline as needed to avoid packing/internal dressing sticking to the wound. - clean edges of skin around the wound with water/gauze, making sure there is no tape debris or leakage left on skin that could cause skin irritation or breakdown. - dampen and clean kerlix with sterile saline and pack wound from wound base to skin level, making sure to take note of any possible areas of wound tracking, tunneling and packing appropriately. Wound can be packed loosely. Trim kerlix to size if a whole kerlix is not required. - cover wound with a dry ABD pad and secure with tape.  - write the date/time on the dry dressing/tape to better track when the last dressing change occurred. - apply any skin protectant/powder recommended by clinician to protect skin/skin folds. - change dressing as needed if leakage occurs, wound gets contaminated, or patient requests to shower. - patient may shower daily with wound open and following the shower the wound should be dried and a clean dressing placed.   Functional status:  ___ No restrictions     ___ Walk up steps independently ___ 24/7 supervision/assistance   ___ Walk up steps with assistance ___ Intermittent supervision/assistance  ___ Bathe/dress independently _X__ Walk with cane     ___ Bathe/dress with assistance ___ Walk Independently    ___ Shower independently ___ Walk with assistance    ___ Shower with assistance _X__ No alcohol     ___ Return to work/school ________   Special Instructions: 1. Continue to do Knee exercises at least  twice a day. 2. Keep a food diary.    COMMUNITY REFERRALS UPON DISCHARGE:   Home Health:   PT & RN  Bryan Worden   Date of last service:03/30/2017  Medical Equipment/Items Ordered:HAS ALL NEEDED EQUIPMENT FROM PREVIOUS TKR    Colostomy, Adult, Care After Refer to this sheet in the next few weeks. These instructions provide you with information about caring for yourself after your procedure. Your health care provider may also give you more specific instructions. Your treatment has been planned according to current medical practices, but problems sometimes occur. Call your health care provider if you have any problems or questions after your procedure. What can I expect after the procedure? After the procedure, it is common to have:  Swelling at the opening that was created during the procedure (stoma).  Slight bleeding around the stoma.  Redness around the stoma.  Follow these instructions at home: Activity  Rest as needed while the stoma area heals.  Return to your normal activities as told by your health care provider. Ask your health care provider what activities are safe for you.  Avoid strenuous activity and abdominal exercises for 3 weeks or for as long as told by your health care provider.  Do not lift anything that is heavier than 10 lb (4.5 kg). Incision care   Follow instructions from your health care provider about how to take care of your incision. Make sure you: ? Wash your hands with soap and water before you change your bandage (dressing). If soap and water are not available, use hand sanitizer. ?  Change your dressing as told by your health care provider. ? Leave stitches (sutures), skin glue, or adhesive strips in place. These skin closures may need to stay in place for 2 weeks or longer. If adhesive strip edges start to loosen and curl up, you may trim the loose edges. Do not remove adhesive strips completely unless your health care  provider tells you to do that. Stoma Care  Keep the stoma area clean.  Clean and dry the skin around the stoma each time you change the colostomy bag. To clean the stoma area: ? Use warm water and only use cleansers that are recommended by your health care provider. ? Rinse the stoma area with plain water. ? Dry the area well.  Use stoma powder or ointment on your skin only as told by your health care provider. Do not use any other powders, gels, wipes, or creams on your skin.  Check the stoma area every day for signs of infection. Check for: ? More redness, swelling, or pain. ? More fluid or blood. ? Pus or warmth.  Measure the stoma opening regularly and record the size. Watch for changes. Share this information with your health care provider. Bathing  Do not take baths, swim, or use a hot tub until your health care provider approves. Ask your health care provider if you can take showers. You may be able to shower with or without the colostomy bag in place. If you bathe with the bag on, dry the bag afterward.  Avoid using harsh or oily soaps when you bathe. Colostomy Bag Care  Follow instructions from your health care provider about how to empty or change the colostomy bag.  Keep colostomy supplies with you at all times.  Store all supplies in a cool, dry place.  Empty the colostomy bag: ? Whenever it is one-third to one-half full. ? At bedtime.  Replace the bag every 2-4 days or as told by your health care provider. Driving  Do not drive for 24 hours if you received a sedative.  Do not drive or operate heavy machinery while taking prescription pain medicine. General instructions  Follow instructions from your health care provider about eating or drinking restrictions.  Take over-the-counter and prescription medicines only as told by your health care provider.  Avoid wearing clothes that are tight directly over your stoma.  Do not use any tobacco products, such as  cigarettes, chewing tobacco, and e-cigarettes. If you need help quitting, ask your health care provider.  (Women) Ask your health care provider about becoming pregnant and about using birth control. Medicines may not be absorbed normally after the procedure.  Keep all follow-up visits as told by your health care provider. This is important. Contact a health care provider if:  You are having trouble caring for your stoma or changing the colostomy bag.  You feel nauseous or you vomit.  You have a fever.  You havemore redness, swelling, or pain at the site of your stoma or around your anus.  You have more fluid or blood coming from your stoma or your anus.  Your stoma area feels warm to the touch.  You have pus coming from your stoma.  You notice a change in the size or appearance of the stoma.  You have abdominal pain, bloating, pressure, or cramping.  Your have stool more often or less often than your health care provider tells you to expect.  You are not making much urine. This may be a sign of  dehydration. Get help right away if:  Your abdominal pain does not go away or it becomes severe.  You keep vomiting.  Your stool is not draining through the stoma.  You have chest pain or an irregular heartbeat. This information is not intended to replace advice given to you by your health care provider. Make sure you discuss any questions you have with your health care provider. Document Released: 03/08/2011 Document Revised: 02/24/2016 Document Reviewed: 06/29/2015 Elsevier Interactive Patient Education  2018 [employer].   My questions have been answered and I understand these instructions. I will adhere to these goals and the provided educational materials after my discharge from the hospital.  Patient/Caregiver Signature _______________________________ Date __________  Clinician Signature _______________________________________ Date __________  Please bring this form and  your medication list with you to all your follow-up doctor's appointments.

## 2017-03-30 NOTE — Plan of Care (Signed)
Problem: Food- and Nutrition-Related Knowledge Deficit (NB-1.1) Goal: Nutrition education Formal process to instruct or train a patient/client in a skill or to impart knowledge to help patients/clients voluntarily manage or modify food choices and eating behavior to maintain or improve health. Outcome: Completed/Met Date Met: 03/30/17 Nutrition Education Note  RD consulted for nutrition education regarding colostomy.   RD provided "Fiber Restricted Nutrition Therapy" handout from the Academy of [employer] and Dietetics.  Explained reasons for pt to follow a low fiber diet. Reviewed low fiber foods and foods recommended and not recommended.   Teach back method used. Pt verbalizes understanding of information provided.   Expect good compliance.  Body mass index is Body mass index is 40.34 kg/m.Marland Kitchen Pt meets criteria for morbid obesity based on current BMI.  Current diet order is soft diet, patient is consuming approximately 75-100% of meals at this time. Labs and medications reviewed. No further nutrition interventions warranted at this time. Plans for discharge today.  Corrin Parker, MS, RD, LDN Pager # [phone] After hours/ weekend pager # [phone]

## 2017-03-30 NOTE — Progress Notes (Signed)
Occupational Therapy Discharge Summary  Patient Details  Name: [name] MRN: [id number] Date of Birth: Mar 15, 1961  Today's Date: 03/30/2017  OT Individual Time: 1282-0813 OT Individual Time Calculation (min): 55 min   Treatment session focused on increased independence with bathing/dressing tasks and pt/family education regarding ADLs. Pt completed BADLs at overall mod I and pt/family feel prepared for dc.  Patient has met 7 of 7 long term goals due to improved activity tolerance, improved balance, postural control and ability to compensate for deficits.  Patient to discharge at overall Modified Independent level.  Patient's care partner is independent to provide the necessary physical assistance for higher level iADLs at discharge.    Reasons goals not met: n/a  Recommendation:  Patient will benefit from ongoing skilled OT services in home health setting to continue to advance functional skills in the area of BADL.  Equipment: No equipment provided  Reasons for discharge: treatment goals met and discharge from hospital  Patient/family agrees with progress made and goals achieved: Yes  OT Discharge Precautions/Restrictions  Precautions Precautions: None Restrictions Weight Bearing Restrictions: Yes LLE Weight Bearing: Weight bearing as tolerated Pain Pain Assessment Pain Assessment: No/denies pain ADL ADL Eating: Independent Grooming: Modified independent Upper Body Bathing: Modified independent Lower Body Bathing: Modified independent Upper Body Dressing: Modified independent (Device) Lower Body Dressing: Modified independent Toileting: Modified independent Toilet Transfer: Modified independent Toilet Transfer Method: Ambulating Tub/Shower Transfer: Modified independent Tub/Shower Transfer Method: Ambulating ADL Comments: Please see functional navigator Perception  Perception: Within Functional Limits Praxis Praxis: Intact Cognition Overall Cognitive Status:  Within Functional Limits for tasks assessed Orientation Level: Oriented X4 Sensation Sensation Light Touch: Appears Intact Coordination Gross Motor Movements are Fluid and Coordinated: Yes Fine Motor Movements are Fluid and Coordinated: Yes Motor  Motor Motor: Within Functional Limits Mobility  Bed Mobility Bed Mobility: Supine to Sit;Sit to Supine Supine to Sit: 6: Modified independent (Device/Increase time) Sit to Supine: 6: Modified independent (Device/Increase time) Transfers Sit to Stand: 6: Modified independent (Device/Increase time) Stand to Sit: 6: Modified independent (Device/Increase time)  Balance Dynamic Sitting Balance Dynamic Sitting - Balance Support: During functional activity Dynamic Sitting - Level of Assistance: 6: Modified independent (Device/Increase time) Dynamic Standing Balance Dynamic Standing - Balance Support: During functional activity Dynamic Standing - Level of Assistance: 6: Modified independent (Device/Increase time) Extremity/Trunk Assessment RUE Assessment RUE Assessment: Within Functional Limits LUE Assessment LUE Assessment: Within Functional Limits   See Function Navigator for Current Functional Status.  [name] [name] 03/30/2017, 3:49 PM

## 2017-03-30 NOTE — Progress Notes (Signed)
Social Work  Discharge Note  The overall goal for the admission was met for:   Discharge location: [facility] WITH COLOSTOMY CARE  Length of Stay: Yes-7 DAYS  Discharge activity level: Yes-MOD/I LEVEL  Home/community participation: Yes  Services provided included: MD, RD, PT, OT, RN, CM, TR, Pharmacy and SW  Financial Services: Private Insurance: [facility]  Follow-up services arranged: Home Health: [facility]-PT&RN and Patient/Family request agency HH: [facility] WITH, DME: NO NEEDS  Comments (or additional information):DAUGHTER'S, HUSBAND AND PT WENT THROUGH COLOSTOMY EDUCATION ALL FEEL COMFORTABLE WITH THIS AND READY TO GO HOME. HAS ALL DME FROM PREVIOUS SURGERY-TKR.   Patient/Family verbalized understanding of follow-up arrangements: Yes  Individual responsible for coordination of the follow-up plan: SELF & WAYNE-HUSBAND  Confirmed correct DME delivered: Elease Hashimoto 03/30/2017    Elease Hashimoto

## 2017-03-30 NOTE — Progress Notes (Signed)
Pt's husband and daughter educated on wound care to abdomen as well as ostomy changes. Husband and daughter demonstrated proper wound care techniques. Pt prepared for discharge. Pt discharged to home with husband.

## 2017-03-30 NOTE — Progress Notes (Addendum)
[facility] PHYSICAL MEDICINE & REHABILITATION     PROGRESS NOTE  Subjective/Complaints:  Slept well, excited about D/C  ROS: Denies CP, SOB, N/V/D.  Objective: Vital Signs: Blood pressure (!) 103/44, pulse 88, temperature 98.9 F (37.2 C), temperature source Oral, resp. rate 16, height 5\' 3"  (1.6 m), weight 103.3 kg (227 lb 11.8 oz), SpO2 97 %. Dg Chest 2 View  Result Date: 03/28/2017 CLINICAL DATA:  Fever EXAM: CHEST  2 VIEW COMPARISON:  Mar 22, 2017. FINDINGS: There has been clearing of most of the infiltrate seen recently in the right lower lobe. Slight atelectatic change remains in the right lower lobe. Lungs elsewhere are clear. Heart size and pulmonary vascularity are normal. No adenopathy. There is aortic atherosclerosis. There is degenerative change in the lower thoracic region. There are surgical clips in the right axillary region. IMPRESSION: Most of the infiltrate on the right is clear with mild atelectasis remaining in the right lower lobe. No new opacity. Staples cardiac silhouette. There is aortic atherosclerosis. Electronically Signed   By: Lowella Grip III M.D.   On: 03/28/2017 15:08    Recent Labs  03/28/17 0456 03/29/17 0541  WBC 12.0* 10.1  HGB 8.6* 8.9*  HCT 27.1* 28.9*  PLT 489* 534*    Recent Labs  03/29/17 0541  NA 143  K 4.1  CL 109  GLUCOSE 97  BUN 18  CREATININE 0.93  CALCIUM 8.2*   CBG (last 3)  No results for input(s): GLUCAP in the last 72 hours.  Wt Readings from Last 3 Encounters:  03/28/17 103.3 kg (227 lb 11.8 oz)  03/16/17 104.8 kg (231 lb)  02/28/17 104.8 kg (231 lb)    Physical Exam:  BP (!) 103/44 (BP Location: Right Arm) Comment: rn notified   Pulse 88   Temp 98.9 F (37.2 C) (Oral)   Resp 16   Ht 5\' 3"  (1.6 m)   Wt 103.3 kg (227 lb 11.8 oz)   SpO2 97%   BMI 40.34 kg/m  Gen: NAD. Obese. Head: Normocephalic and atraumatic.  Eyes: EOMI. No discharge.  Cardiovascular: RRR.  No JVD. Respiratory: Effort normal. No  stridor.  GI: Soft. There is tenderness.  Musculoskeletal: She exhibits edema. She exhibits no tenderness. Left knee full ext , limited flexionNeurological: She is alert and oriented Motor: B/l UE motor 5/5.  LLE: 4+/5 HF, HF, 5/5 ADF/PF (stable).  No sensory deficits.   Skin: Skin is warm and dry.  +Abdominal wound with dressing c/d/i.  Left knee incision clean,dry, intact.  Psychiatric: She has a normal mood and affect. Her behavior is normal. Thought content normal.   Assessment/Plan: 1. Functional deficits secondary to recent left TKA and debility after perforated colon  Stable for D/C today F/u PCP in 3-4 weeks F/u PM&R 2 weeks See D/C summary See D/C instructions Function:  Bathing Bathing position Bathing activity did not occur: Refused Position: Wheelchair/chair at sink  Bathing parts Body parts bathed by patient: Right arm, Left arm, Chest, Abdomen, Front perineal area, Right upper leg, Left upper leg, Right lower leg, Left lower leg, Buttocks Body parts bathed by helper: Buttocks  Bathing assist Assist Level: Set up, Supervision or verbal cues   Set up : To obtain items  Upper Body Dressing/Undressing Upper body dressing   What is the patient wearing?: Pull over shirt/dress, Bra Bra - Perfomed by patient: Thread/unthread left bra strap, Hook/unhook bra (pull down sports bra), Thread/unthread right bra strap   Pull over shirt/dress - Perfomed by  patient: Thread/unthread right sleeve, Put head through opening, Pull shirt over trunk, Thread/unthread left sleeve          Upper body assist Assist Level: Set up, Supervision or verbal cues   Set up : To obtain clothing/put away  Lower Body Dressing/Undressing Lower body dressing   What is the patient wearing?: Underwear Underwear - Performed by patient: Thread/unthread right underwear leg, Pull underwear up/down, Thread/unthread left underwear leg   Pants- Performed by patient: Thread/unthread right pants leg,  Thread/unthread left pants leg, Pull pants up/down   Non-skid slipper socks- Performed by patient: Don/doff left sock, Don/doff right sock                    Lower body assist Assist for lower body dressing: Supervision or verbal cues Assistive Device Comment: reacher, sock aide    Toileting Toileting   Toileting steps completed by patient: Adjust clothing prior to toileting, Performs perineal hygiene, Adjust clothing after toileting Toileting steps completed by helper: Adjust clothing prior to toileting, Performs perineal hygiene, Adjust clothing after toileting Toileting Assistive Devices: Grab bar or rail  Toileting assist Assist level: Supervision or verbal cues   Transfers Chair/bed transfer   Chair/bed transfer method: Ambulatory Chair/bed transfer assist level: No Help, no cues, assistive device, takes more than a reasonable amount of time Chair/bed transfer assistive device: Armrests, Librarian, academic     Max distance: 300 Assist level: No help, No cues, assistive device, takes more than a reasonable amount of time   Wheelchair Wheelchair activity did not occur: N/A        Cognition Comprehension Comprehension assist level: Follows basic conversation/direction with extra time/assistive device  Expression Expression assist level: Expresses complex ideas: With extra time/assistive device  Social Interaction Social Interaction assist level: Interacts appropriately with others with medication or extra time (anti-anxiety, antidepressant).  Problem Solving Problem solving assist level: Solves complex problems: With extra time  Memory Memory assist level: Complete Independence: No helper    Medical Problem List and Plan: 1.  Functional and mobility deficits secondary to recent left TKA and debility after perforated colon  D/C today  2.  DVT Prophylaxis/Anticoagulation: Pharmaceutical: Lovenox 3. Pain Management: continue oxycodone prn. 4. Mood: team  to provide ego support. LCSW to follow for evaluation and support. Added protein supplement to promote wound healing.  5. Neuropsych: This patient is capable of making decisions on her own behalf. 6. Skin/Wound Care: routine pressure relief measures. Maintain adequate nutritional and hydration status.   stoma/ostomy care per [place] RN  Discussed wound care with surg, appreciate recs - BID changes 7. Fluids/Electrolytes/Nutrition:Monitor I/O.  8. Leucocytosis/FUO: Monitor CBC serially  Augmentin D #7/10  WBCs 10.1 on 5/31  Afebrile  9. HTN: Monitor BP bid. On benazepril daily.  Controlled 5/31 10.   ABLA:  Iron supplement.    Hb 8.9 on 5/31 11. Hypokalemia  K+ 4.1 on 5/31  Cont to monitor  LOS (Days) 7 A FACE TO FACE EVALUATION WAS PERFORMED  Charlett Blake 03/30/2017 6:58 AM

## 2017-03-30 NOTE — Consult Note (Signed)
[facility] Nurse ostomy follow up Stoma type/location: LLQ, end colostomy Stomal assessment/size: 1"x 1 3/4" oval shaped, pink, moist Peristomal assessment: intact  Treatment options for stomal/peristomal skin: using 1/2 of skin barrier from 7-11 oclock and from 1-4 oclock, abdominal contour dips here When patient sitting, the stoma is in a crease Output green, pasty, +flatus Ostomy pouching: 1pc.soft convexity  Education provided:  Watched patient's sister and daughter complete pouch change all steps with minimal cuing. New pattern cut for family. They also have a video of the steps. I have marked the [employer] catelog for the products she is using. She will have [place] for continued education and support.   Enrolled patient in [facility] Discharge program: Yes Family has my contact information  Daughter is independent with wound care.   Wiley Ford, [place]

## 2017-04-01 DIAGNOSIS — M1711 Unilateral primary osteoarthritis, right knee: Secondary | ICD-10-CM | POA: Diagnosis not present

## 2017-04-01 DIAGNOSIS — Z96652 Presence of left artificial knee joint: Secondary | ICD-10-CM | POA: Diagnosis not present

## 2017-04-01 DIAGNOSIS — Z471 Aftercare following joint replacement surgery: Secondary | ICD-10-CM | POA: Diagnosis not present

## 2017-04-04 DIAGNOSIS — Z96652 Presence of left artificial knee joint: Secondary | ICD-10-CM | POA: Diagnosis not present

## 2017-04-04 DIAGNOSIS — Z471 Aftercare following joint replacement surgery: Secondary | ICD-10-CM | POA: Diagnosis not present

## 2017-04-04 DIAGNOSIS — M1711 Unilateral primary osteoarthritis, right knee: Secondary | ICD-10-CM | POA: Diagnosis not present

## 2017-04-05 DIAGNOSIS — M1712 Unilateral primary osteoarthritis, left knee: Secondary | ICD-10-CM | POA: Diagnosis not present

## 2017-04-06 DIAGNOSIS — Z96652 Presence of left artificial knee joint: Secondary | ICD-10-CM | POA: Diagnosis not present

## 2017-04-06 DIAGNOSIS — Z471 Aftercare following joint replacement surgery: Secondary | ICD-10-CM | POA: Diagnosis not present

## 2017-04-06 DIAGNOSIS — M1711 Unilateral primary osteoarthritis, right knee: Secondary | ICD-10-CM | POA: Diagnosis not present

## 2017-04-06 NOTE — Addendum Note (Signed)
Addendum  created 04/06/17 1235 by Lyn Hollingshead, MD   Sign clinical note

## 2017-04-09 DIAGNOSIS — Z471 Aftercare following joint replacement surgery: Secondary | ICD-10-CM | POA: Diagnosis not present

## 2017-04-09 DIAGNOSIS — Z933 Colostomy status: Secondary | ICD-10-CM | POA: Diagnosis not present

## 2017-04-09 DIAGNOSIS — M1711 Unilateral primary osteoarthritis, right knee: Secondary | ICD-10-CM | POA: Diagnosis not present

## 2017-04-09 DIAGNOSIS — Z96652 Presence of left artificial knee joint: Secondary | ICD-10-CM | POA: Diagnosis not present

## 2017-04-10 DIAGNOSIS — Z96652 Presence of left artificial knee joint: Secondary | ICD-10-CM | POA: Diagnosis not present

## 2017-04-10 DIAGNOSIS — Z471 Aftercare following joint replacement surgery: Secondary | ICD-10-CM | POA: Diagnosis not present

## 2017-04-10 DIAGNOSIS — M1711 Unilateral primary osteoarthritis, right knee: Secondary | ICD-10-CM | POA: Diagnosis not present

## 2017-04-10 DIAGNOSIS — Z933 Colostomy status: Secondary | ICD-10-CM | POA: Diagnosis not present

## 2017-04-11 DIAGNOSIS — K5780 Diverticulitis of intestine, part unspecified, with perforation and abscess without bleeding: Secondary | ICD-10-CM | POA: Diagnosis not present

## 2017-04-11 DIAGNOSIS — Z8571 Personal history of Hodgkin lymphoma: Secondary | ICD-10-CM | POA: Diagnosis not present

## 2017-04-11 DIAGNOSIS — Z933 Colostomy status: Secondary | ICD-10-CM | POA: Diagnosis not present

## 2017-04-12 DIAGNOSIS — M1711 Unilateral primary osteoarthritis, right knee: Secondary | ICD-10-CM | POA: Diagnosis not present

## 2017-04-12 DIAGNOSIS — Z471 Aftercare following joint replacement surgery: Secondary | ICD-10-CM | POA: Diagnosis not present

## 2017-04-12 DIAGNOSIS — Z96652 Presence of left artificial knee joint: Secondary | ICD-10-CM | POA: Diagnosis not present

## 2017-04-12 DIAGNOSIS — Z933 Colostomy status: Secondary | ICD-10-CM | POA: Diagnosis not present

## 2017-04-13 DIAGNOSIS — Z933 Colostomy status: Secondary | ICD-10-CM | POA: Diagnosis not present

## 2017-04-14 DIAGNOSIS — Z933 Colostomy status: Secondary | ICD-10-CM | POA: Diagnosis not present

## 2017-04-15 DIAGNOSIS — Z933 Colostomy status: Secondary | ICD-10-CM | POA: Diagnosis not present

## 2017-04-16 DIAGNOSIS — Z933 Colostomy status: Secondary | ICD-10-CM | POA: Diagnosis not present

## 2017-04-16 DIAGNOSIS — M1712 Unilateral primary osteoarthritis, left knee: Secondary | ICD-10-CM | POA: Diagnosis not present

## 2017-04-17 DIAGNOSIS — Z933 Colostomy status: Secondary | ICD-10-CM | POA: Diagnosis not present

## 2017-04-18 DIAGNOSIS — Z933 Colostomy status: Secondary | ICD-10-CM | POA: Diagnosis not present

## 2017-04-20 DIAGNOSIS — M1712 Unilateral primary osteoarthritis, left knee: Secondary | ICD-10-CM | POA: Diagnosis not present

## 2017-04-23 DIAGNOSIS — M1712 Unilateral primary osteoarthritis, left knee: Secondary | ICD-10-CM | POA: Diagnosis not present

## 2017-04-25 DIAGNOSIS — M1712 Unilateral primary osteoarthritis, left knee: Secondary | ICD-10-CM | POA: Diagnosis not present

## 2017-05-01 DIAGNOSIS — M1712 Unilateral primary osteoarthritis, left knee: Secondary | ICD-10-CM | POA: Diagnosis not present

## 2017-05-04 DIAGNOSIS — M1712 Unilateral primary osteoarthritis, left knee: Secondary | ICD-10-CM | POA: Diagnosis not present

## 2017-05-07 ENCOUNTER — Other Ambulatory Visit: Payer: Self-pay | Admitting: Physical Medicine and Rehabilitation

## 2017-05-11 NOTE — Addendum Note (Signed)
Addendum  created 05/11/17 1103 by Nolon Nations, MD   Sign clinical note

## 2017-05-14 DIAGNOSIS — M1712 Unilateral primary osteoarthritis, left knee: Secondary | ICD-10-CM | POA: Diagnosis not present

## 2017-05-15 DIAGNOSIS — Z933 Colostomy status: Secondary | ICD-10-CM | POA: Diagnosis not present

## 2017-05-16 DIAGNOSIS — Z933 Colostomy status: Secondary | ICD-10-CM | POA: Diagnosis not present

## 2017-05-17 DIAGNOSIS — Z933 Colostomy status: Secondary | ICD-10-CM | POA: Diagnosis not present

## 2017-05-18 DIAGNOSIS — Z933 Colostomy status: Secondary | ICD-10-CM | POA: Diagnosis not present

## 2017-05-18 DIAGNOSIS — M1712 Unilateral primary osteoarthritis, left knee: Secondary | ICD-10-CM | POA: Diagnosis not present

## 2017-05-19 DIAGNOSIS — Z933 Colostomy status: Secondary | ICD-10-CM | POA: Diagnosis not present

## 2017-05-20 DIAGNOSIS — Z933 Colostomy status: Secondary | ICD-10-CM | POA: Diagnosis not present

## 2017-05-21 DIAGNOSIS — Z933 Colostomy status: Secondary | ICD-10-CM | POA: Diagnosis not present

## 2017-05-22 DIAGNOSIS — Z933 Colostomy status: Secondary | ICD-10-CM | POA: Diagnosis not present

## 2017-05-23 DIAGNOSIS — Z933 Colostomy status: Secondary | ICD-10-CM | POA: Diagnosis not present

## 2017-05-24 DIAGNOSIS — Z933 Colostomy status: Secondary | ICD-10-CM | POA: Diagnosis not present

## 2017-05-25 DIAGNOSIS — M1712 Unilateral primary osteoarthritis, left knee: Secondary | ICD-10-CM | POA: Diagnosis not present

## 2017-05-30 DIAGNOSIS — M1712 Unilateral primary osteoarthritis, left knee: Secondary | ICD-10-CM | POA: Diagnosis not present

## 2017-06-02 ENCOUNTER — Telehealth: Payer: Self-pay | Admitting: Surgery

## 2017-06-02 NOTE — Telephone Encounter (Signed)
The patient called about her wound. She had a perforated colon requiring colostomy by Dr. Jonny Ruiz on 03/17/2017.  She has an area that has healed and is now red and swollen. She still has an open wound that she is changing once a day.  She is going to put warm soaks on the area.  She has an appt with Dr. Georgette Dover on Tuesday, 8/7.  Alphonsa Overall, MD, [facility] Surgery Pager: [phone] Office phone:  [phone]

## 2017-06-05 DIAGNOSIS — M1712 Unilateral primary osteoarthritis, left knee: Secondary | ICD-10-CM | POA: Diagnosis not present

## 2017-06-18 DIAGNOSIS — M1712 Unilateral primary osteoarthritis, left knee: Secondary | ICD-10-CM | POA: Diagnosis not present

## 2017-06-20 DIAGNOSIS — M1712 Unilateral primary osteoarthritis, left knee: Secondary | ICD-10-CM | POA: Diagnosis not present

## 2017-06-22 ENCOUNTER — Ambulatory Visit: Payer: Self-pay | Admitting: Surgery

## 2017-06-22 NOTE — H&P (Signed)
History of Present Illness [name]. Allison Stainback MD; 06/22/2017 9:30 AM) The patient is a 56 year old female presenting for a post-operative visit. The patient is a 56 year old female presenting for a post-operative visit. Patient is a 56 y.o. female who presented to [facility] on 03/16/17 with LLQ pain four days after left knee replacement by Dr. Noemi Chapel. Workup showed perforated diverticulitis. She was taken to the OR emergently and and underwent Hartmann's procedure. Wound vac was placed over midline abdominal wound. Patient tolerated procedure and was admitted to med-surg. WOC was consulted for management of wound vac and colostomy. POD#1 patient was tolerating clear liquids. POD#3 patient was passing flatus and mobilizing with therapies. POD#5 patient began to have stool output in her colostomy and she was advanced to a soft diet. Patient remained on IV zosyn and vancomycin postoperatively for treatment of intraabdominal infection. On POD#5 patient was noted to have an elevated WBC and low grade fever, workup for source of infection was negative. POD#6 the WBC was coming back down and the patient was afebrile. Vancomycin was stopped 03/21/17 due to elevated creatinine. Zosyn was converted to PO augmentin on 03/23/17 and should be continued for 10 days. Patient is tolerating PO intake, voiding well, mobilizing with therapies, pain is well controlled. Patient was discharged in inpatient rehab on 03/23/17 and home on 03/30/17.   Her appetite is good. Her ostomy is functioning well with good hydration and occasional Miralax. She is not using any pain medication. She is in very good spirits. She is eager to return to work at the [employer].  Her midline wound has healed completely and has no further drainage. She occasionally has some skin irritation around her stoma that oozes with bag changes.   Problem List/Past Medical Rodman Key K. Rochester Serpe, MD; 06/22/2017 9:30 AM) WOUND INFECTION AFTER SURGERY (T81.4XXA) PERFORATED  SIGMOID COLON (K63.1)  Past Surgical History (Pama Roskos K. Vivyan Biggers, MD; 06/22/2017 9:30 AM) Knee Surgery Left. Tonsillectomy  Diagnostic Studies History [name]. Raeley Gilmore, MD; 06/22/2017 9:30 AM) Colonoscopy 1-5 years ago  Allergies (Tanisha A. Owens Shark, [place]; 06/22/2017 9:01 AM) Cephalexin *CEPHALOSPORINS* Codeine Phosphate *ANALGESICS - OPIOID* Allergies Reconciled  Medication History (Tanisha A. Owens Shark, [place]; 06/22/2017 9:02 AM) AmLODIPine Besylate (10MG  Tablet, Oral) Active. Benazepril-Hydrochlorothiazide (20-25MG  Tablet, Oral) Active. Levothyroxine Sodium (88MCG Tablet, Oral) Active. Aspirin EC (325MG  Tablet DR, Oral) Active. Medications Reconciled  Social History [name]. Berlie Persky, MD; 06/22/2017 9:30 AM) Alcohol use Occasional alcohol use. Caffeine use Coffee. No drug use Tobacco use Former smoker.  Family History [name]. Vaeda Westall, MD; 06/22/2017 9:30 AM) Arthritis Mother, Sister. Thyroid problems Daughter, Sister.  Pregnancy / Birth History [name]. Diana Armijo, MD; 06/22/2017 9:30 AM) Age at menarche 46 years. Age of menopause <43 Gravida 3 Maternal age 97-20 Para 2  Other Problems [name]. Maudine Kluesner, MD; 06/22/2017 9:30 AM) Arthritis Cancer Heart murmur High blood pressure Thyroid Disease    Vitals (Tanisha A. Brown RMA; 06/22/2017 9:01 AM) 06/22/2017 9:01 AM Weight: 230.6 lb Height: 63in Body Surface Area: 2.05 m Body Mass Index: 40.85 kg/m  Temp.: 97.29F  Pulse: 106 (Regular)  P.OX: 97% (Room air) BP: 124/82 (Sitting, Left Arm, Standard)      Physical Exam Rodman Key K. Kodi Steil MD; 06/22/2017 9:31 AM)  The physical exam findings are as follows: Note:WDWN in NAD Eyes: Pupils equal, round; sclera anicteric HENT: Oral mucosa moist; good dentition Neck: No masses palpated, no thyromegaly Lungs: CTA bilaterally; normal respiratory effort CV: Regular rate and rhythm; no murmurs; extremities well-perfused with no  edema Abd: +bowel  sounds, soft, non-tender, no palpable organomegaly; healed midline incision LLQ ostomy pink; thick brown stool in bag; no sign of prolapse or hernia Skin: Warm, dry; no sign of jaundice Psychiatric - alert and oriented x 4; calm mood and affect    Assessment & Plan Rodman Key K. Rodriques Badie MD; 06/22/2017 9:32 AM)  PERFORATED SIGMOID COLON (K63.1)  Current Plans Schedule for Surgery - colostomy reversal. The surgical procedure has been discussed with the patient. Potential risks, benefits, alternative treatments, and expected outcomes have been explained. All of the patient's questions at this time have been answered. The likelihood of reaching the patient's treatment goal is good. The patient understand the proposed surgical procedure and wishes to proceed. Note:Will schedule ostomy reversal in early October.  [name]. Georgette Dover, MD, [facility] Surgery  General/ Trauma Surgery  06/22/2017 9:32 AM

## 2017-06-27 DIAGNOSIS — M1712 Unilateral primary osteoarthritis, left knee: Secondary | ICD-10-CM | POA: Diagnosis not present

## 2017-07-10 DIAGNOSIS — M1712 Unilateral primary osteoarthritis, left knee: Secondary | ICD-10-CM | POA: Diagnosis not present

## 2017-07-19 ENCOUNTER — Encounter (HOSPITAL_COMMUNITY)
Admission: RE | Admit: 2017-07-19 | Discharge: 2017-07-19 | Disposition: A | Discharge status: Home / Self Care | Payer: 59 | Source: Ambulatory Visit | Attending: Surgery | Admitting: Surgery

## 2017-07-19 ENCOUNTER — Encounter (HOSPITAL_COMMUNITY): Payer: Self-pay

## 2017-07-19 DIAGNOSIS — K631 Perforation of intestine (nontraumatic): Secondary | ICD-10-CM | POA: Diagnosis not present

## 2017-07-19 DIAGNOSIS — Z01818 Encounter for other preprocedural examination: Secondary | ICD-10-CM | POA: Insufficient documentation

## 2017-07-19 HISTORY — DX: Cardiac murmur, unspecified: R01.1

## 2017-07-19 HISTORY — DX: Personal history of irradiation: Z92.3

## 2017-07-19 HISTORY — DX: Personal history of urinary calculi: Z87.442

## 2017-07-19 LAB — CBC
HEMATOCRIT: 40.6 % (ref 36.0–46.0)
HEMOGLOBIN: 13.3 g/dL (ref 12.0–15.0)
MCH: 30.9 pg (ref 26.0–34.0)
MCHC: 32.8 g/dL (ref 30.0–36.0)
MCV: 94.2 fL (ref 78.0–100.0)
Platelets: 261 10*3/uL (ref 150–400)
RBC: 4.31 MIL/uL (ref 3.87–5.11)
RDW: 15.4 % (ref 11.5–15.5)
WBC: 8.3 10*3/uL (ref 4.0–10.5)

## 2017-07-19 LAB — BASIC METABOLIC PANEL
ANION GAP: 7 (ref 5–15)
BUN: 16 mg/dL (ref 6–20)
CHLORIDE: 103 mmol/L (ref 101–111)
CO2: 27 mmol/L (ref 22–32)
Calcium: 9.5 mg/dL (ref 8.9–10.3)
Creatinine, Ser: 0.75 mg/dL (ref 0.44–1.00)
GFR calc Af Amer: >60 mL/min (ref >60)
GFR calc non Af Amer: >60 mL/min (ref >60)
Glucose, Bld: 101 mg/dL — ABNORMAL HIGH (ref 65–99)
POTASSIUM: 4.1 mmol/L (ref 3.5–5.1)
Sodium: 137 mmol/L (ref 135–145)

## 2017-07-19 NOTE — Progress Notes (Signed)
PCP: Dr. Sharilyn Sites Cardiologist: Dr. Jenkins Rouge  Pt. Reports she has received a bowel prep sheet from Dr. Vonna Kotyk office.

## 2017-07-19 NOTE — Pre-Procedure Instructions (Addendum)
[name]  07/19/2017      [facility] - [place], [place] - [id number] [place] [address] Phone: [phone] Fax: [phone]    Your procedure is scheduled on Thurs. Oct 4  Report to [facility] Admitting at 5:30  A.M.  Call this number if you have problems the morning of surgery:  [phone]   Remember:  Do not eat food or drink liquids after midnight.  Take these medicines the morning of surgery with A SIP OF WATER :  Tylenol if needed, levothyroxine (synthroid), valium if needed             7 days prior to surgery STOP taking any Aspirin, Aleve, Naproxen, Ibuprofen, Motrin, Advil, Goody's, BC's, all herbal medications, fish oil, and all vitamins           Bowel prep per Dr. Georgette Dover   Do not wear jewelry, make-up or nail polish.  Do not wear lotions, powders, or perfumes, or deoderant.  Do not shave 48 hours prior to surgery.  Men may shave face and neck.  Do not bring valuables to the hospital.  [facility] is not responsible for any belongings or valuables.  Contacts, dentures or bridgework may not be worn into surgery.  Leave your suitcase in the car.  After surgery it may be brought to your room.  For patients admitted to the hospital, discharge time will be determined by your treatment team.  Patients discharged the day of surgery will not be allowed to drive home.    Special instructions:  [facility]- Preparing For Surgery  Before surgery, you can play an important role. Because skin is not sterile, your skin needs to be as free of germs as possible. You can reduce the number of germs on your skin by washing with CHG (chlorahexidine gluconate) Soap before surgery.  CHG is an antiseptic cleaner which kills germs and bonds with the skin to continue killing germs even after washing.  Please do not use if you have an allergy to CHG or antibacterial soaps. If your skin becomes reddened/irritated stop using the  CHG.  Do not shave (including legs and underarms) for at least 48 hours prior to first CHG shower. It is OK to shave your face.  Please follow these instructions carefully.   1. Shower the NIGHT BEFORE SURGERY and the MORNING OF SURGERY with CHG.   2. If you chose to wash your hair, wash your hair first as usual with your normal shampoo.  3. After you shampoo, rinse your hair and body thoroughly to remove the shampoo.  4. Use CHG as you would any other liquid soap. You can apply CHG directly to the skin and wash gently with a scrungie or a clean washcloth.   5. Apply the CHG Soap to your body ONLY FROM THE NECK DOWN.  Do not use on open wounds or open sores. Avoid contact with your eyes, ears, mouth and genitals (private parts). Wash genitals (private parts) with your normal soap.  6. Wash thoroughly, paying special attention to the area where your surgery will be performed.  7. Thoroughly rinse your body with warm water from the neck down.  8. DO NOT shower/wash with your normal soap after using and rinsing off the CHG Soap.  9. Pat yourself dry with a CLEAN TOWEL.   10. Wear CLEAN PAJAMAS   11. Place CLEAN SHEETS on your bed the night of your  first shower and DO NOT SLEEP WITH PETS.    Day of Surgery: Do not apply any deodorants/lotions. Please wear clean clothes to the hospital/surgery center.      Please read over the following fact sheets that you were given. Coughing and Deep Breathing and Surgical Site Infection Prevention

## 2017-07-20 NOTE — Progress Notes (Signed)
Anesthesia Chart Review: Patient is a 56 year old female scheduled for colostomy reversal on 08/02/17 by Dr. Donnie Mesa.  History includes former smoker (quit '91), HTN, murmur, nephrolithiasis, hypothyroidism (following radioactive iodine ablation '80's), Hodgkins lymphoma '92 (s/p bone marrow transplant), tonsillectomy, ectopic pregnancy '90 (s/p surgery), left TKA 03/12/17, perforated sigmoid diverticulitis s/p sigmoid colectomy with descending colostomy 03/17/17. BMI is consistent with morbid obesity.  - PCP is Dr. Sharilyn Sites.  - Cardiologist is Dr. Jenkins Rouge. Seen for preoperative evaluation prior to 03/12/17 TKA.   Meds include amlodipine, aspirin 81 mg, Lotensin HCT, Zyrtec, Valium, Benadryl, levothyroxine, pravastatin.  BP (!) 141/80   Pulse (!) 108   Temp 37.2 C   Resp 20   Ht 5\' 3"  (1.6 m)   Wt 232 lb 14.4 oz (105.6 kg)   SpO2 95%   BMI 41.26 kg/m   EKG 07/19/17: NSR, non-specific ST abnormality. Since 03/16/17, rate is slower.  Nuclear stress test 02/15/17:  Nuclear stress EF: 62%.  There was < 6mm of J point depression with horizontal ST segment depression  There is a medium sized defect of mild severity present in the basal anterior, mid anterior and apical anterior location. The defect is non-reversible and likely secondary to breast attenuation artifact. No ischemia noted.  This is a low risk study.  The left ventricular ejection fraction is normal (55-65%).  Echo 02/15/17: Study Conclusions - Left ventricle: The cavity size was normal. There was mild   concentric hypertrophy. Systolic function was normal. The   estimated ejection fraction was in the range of 60% to 65%. Wall   motion was normal; there were no regional wall motion   abnormalities. There was no evidence of elevated ventricular   filling pressure by Doppler parameters. - Mitral valve: Mildly thickened leaflets . There was mild   regurgitation.  CXR 03/18/17: IMPRESSION: Most of the infiltrate  on the right is clear with mild atelectasis remaining in the right lower lobe. No new opacity. Staples cardiac silhouette. There is aortic atherosclerosis.  Preoperative labs noted.   If no acute changes then I anticipate that she can proceed as planned.  [facility]/Anesthesiology Phone [phone] 07/20/2017 6:32 PM

## 2017-07-22 ENCOUNTER — Encounter (HOSPITAL_COMMUNITY): Payer: Self-pay

## 2017-07-22 ENCOUNTER — Emergency Department (HOSPITAL_COMMUNITY)
Admission: EM | Admit: 2017-07-22 | Discharge: 2017-07-22 | Disposition: A | Discharge status: Home / Self Care | Payer: 59 | Attending: Emergency Medicine | Admitting: Emergency Medicine

## 2017-07-22 ENCOUNTER — Emergency Department (HOSPITAL_COMMUNITY): Payer: 59

## 2017-07-22 DIAGNOSIS — Z8571 Personal history of Hodgkin lymphoma: Secondary | ICD-10-CM | POA: Diagnosis not present

## 2017-07-22 DIAGNOSIS — Z7982 Long term (current) use of aspirin: Secondary | ICD-10-CM | POA: Insufficient documentation

## 2017-07-22 DIAGNOSIS — Z79899 Other long term (current) drug therapy: Secondary | ICD-10-CM | POA: Insufficient documentation

## 2017-07-22 DIAGNOSIS — I10 Essential (primary) hypertension: Secondary | ICD-10-CM | POA: Diagnosis not present

## 2017-07-22 DIAGNOSIS — J40 Bronchitis, not specified as acute or chronic: Secondary | ICD-10-CM

## 2017-07-22 DIAGNOSIS — R739 Hyperglycemia, unspecified: Secondary | ICD-10-CM | POA: Insufficient documentation

## 2017-07-22 DIAGNOSIS — J209 Acute bronchitis, unspecified: Secondary | ICD-10-CM | POA: Diagnosis not present

## 2017-07-22 DIAGNOSIS — E039 Hypothyroidism, unspecified: Secondary | ICD-10-CM | POA: Diagnosis not present

## 2017-07-22 DIAGNOSIS — R05 Cough: Secondary | ICD-10-CM | POA: Diagnosis not present

## 2017-07-22 DIAGNOSIS — Z87891 Personal history of nicotine dependence: Secondary | ICD-10-CM | POA: Insufficient documentation

## 2017-07-22 MED ORDER — ALBUTEROL SULFATE HFA 108 (90 BASE) MCG/ACT IN AERS: 2.0000 | INHALATION_SPRAY | RESPIRATORY_TRACT | 0 refills | Status: AC | PRN

## 2017-07-22 MED ORDER — LEVOFLOXACIN 500 MG PO TABS: 500.0000 mg | ORAL_TABLET | Freq: Every day | ORAL | 0 refills | Status: DC

## 2017-07-22 NOTE — ED Triage Notes (Signed)
Reports of cough for a few weeks. Has completed z-pack and no improvement. Recently traveled to [place].

## 2017-07-22 NOTE — ED Provider Notes (Signed)
[facility] DEPT Provider Note   CSN: [id number] Arrival date & time: 07/22/17  1833     History   Chief Complaint Chief Complaint  Patient presents with  . Cough    HPI [name] is a 56 y.o. female.  The history is provided by the patient. No language interpreter was used.  Cough  This is a new problem. The current episode started more than 1 week ago. The problem occurs constantly. The problem has been gradually worsening. The cough is productive of sputum. There has been no fever. Associated symptoms include ear congestion. She has tried nothing for the symptoms. The treatment provided mild relief. Risk factors include travel to endemic areas. She is not a smoker. Her past medical history is significant for bronchitis and pneumonia.  Pt reports she thought she had a sinus infection.  Pt reports she was treated with zithromax but now has a bad cough.  Pt has pain to left ear and some sinus drainage.  Pt reports annoying cough.  Pt has had bronchitis and has used an inhaler in the past.  Pt reports she is suppose to have an ostomy reversal Oct 4.  Pt wants to make sure infection is cleared.  Pt has had pneumonia in the past.    Past Medical History:  Diagnosis Date  . Anxiety   . Cancer ([place])    hodgkins lymphoma, 1992 Diagnosed  . H/O radioactive iodine thyroid ablation 1980's  . Heart murmur   . History of kidney stones   . Hypertension   . Hypothyroidism    "thyroid is dead"  . Primary localized osteoarthritis of left knee   . S/P total knee replacement using cement, left 03/19/2017    Patient Active Problem List   Diagnosis Date Noted  . Pneumonitis 03/30/2017  . Fever   . Debility 03/28/2017  . FUO (fever of unknown origin)   . Leukocytosis   . Hypokalemia   . Acute blood loss anemia   . Benign essential HTN   . History of total left knee replacement   . Perforated bowel ([place]) 03/23/2017  . S/P total knee replacement using cement, left 03/12/2017 by Dr  Noemi Chapel 03/19/2017  . Perforated diverticulum   . History of lymphoma   . Hyperglycemia   . Post-op pain   . Perforation of sigmoid colon due to diverticulitis 03/17/2017  . Primary localized osteoarthritis of left knee   . Hypothyroidism   . Hypertension   . Cancer ([place])   . OSTEOARTHRITIS, KNEE, RIGHT 03/30/2010    Past Surgical History:  Procedure Laterality Date  . AXILLARY LYMPH NODE DISSECTION Right 1992  . BONE MARROW TRANSPLANT    . COLON RESECTION SIGMOID N/A 03/17/2017   Procedure: COLON RESECTION SIGMOID AND CREATION OF COLOSTOMY;  Surgeon: Donnie Mesa, MD;  Location: [place];  Service: General;  Laterality: N/A;  . COLONOSCOPY N/A 09/07/2015   Procedure: COLONOSCOPY;  Surgeon: Aviva Signs, MD;  [facility];  Service: Gastroenterology;  Laterality: N/A;  . ECTOPIC PREGNANCY SURGERY  1990  . TONSILLECTOMY  1965  . TOTAL KNEE ARTHROPLASTY Left 03/12/2017   Procedure: LEFT TOTAL KNEE ARTHROPLASTY;  Surgeon: Elsie Saas, MD;  Location: [facility];  Service: Orthopedics;  Laterality: Left;    OB History    No data available       Home Medications    Prior to Admission medications   Medication Sig Start Date End Date Taking? Authorizing Provider  acetaminophen (TYLENOL) 325 MG  tablet Take 1-2 tablets (325-650 mg total) by mouth every 6 (six) hours as needed for mild pain. 03/30/17   Love, Ivan Anchors, PA-C  albuterol (PROVENTIL HFA;VENTOLIN HFA) 108 (90 Base) MCG/ACT inhaler Inhale 2 puffs into the lungs every 4 (four) hours as needed for wheezing or shortness of breath. 07/22/17   Fransico Meadow, PA-C  amLODipine (NORVASC) 10 MG tablet Take 1 tablet (10 mg total) by mouth every evening. 03/30/17   Love, Ivan Anchors, PA-C  amoxicillin-clavulanate (AUGMENTIN) 875-125 MG tablet Take 1 tablet by mouth every 12 (twelve) hours. Patient not taking: Reported on 07/19/2017 03/30/17   Love, Ivan Anchors, PA-C  aspirin EC 81 MG tablet Take 81 mg by mouth daily.    [provider]   benazepril (LOTENSIN) 20 MG tablet Take 1 tablet (20 mg total) by mouth daily. Patient not taking: Reported on 07/19/2017 03/30/17   Love, Ivan Anchors, PA-C  benazepril-hydrochlorthiazide (LOTENSIN HCT) 20-25 MG tablet Take 1 tablet by mouth daily. 06/05/17   [provider]  cetirizine (ZYRTEC) 10 MG tablet Take 10 mg by mouth daily.    [provider]  diazepam (VALIUM) 2 MG tablet Take 2 mg by mouth as needed. 05/21/17   [provider]  diphenhydrAMINE (BENADRYL) 25 mg capsule Take 25 mg by mouth at bedtime.    [provider]  iron polysaccharides (NIFEREX) 150 MG capsule Take 1 capsule (150 mg total) by mouth 2 (two) times daily before lunch and supper. Patient not taking: Reported on 07/19/2017 03/30/17   Love, Ivan Anchors, PA-C  levofloxacin (LEVAQUIN) 500 MG tablet Take 1 tablet (500 mg total) by mouth daily. 07/22/17   Fransico Meadow, PA-C  levothyroxine (SYNTHROID, LEVOTHROID) 88 MCG tablet Take 88 mcg by mouth daily before breakfast.    [provider]  methocarbamol (ROBAXIN) 500 MG tablet Take 0.5-1 tablets (250-500 mg total) by mouth every 8 (eight) hours as needed for muscle spasms. Patient not taking: Reported on 07/19/2017 03/30/17   Love, Ivan Anchors, PA-C  multivitamin (PROSIGHT) TABS tablet Take 1 tablet by mouth daily. 03/31/17   Love, Ivan Anchors, PA-C  ondansetron (ZOFRAN-ODT) 4 MG disintegrating tablet Take 1 tablet (4 mg total) by mouth every 6 (six) hours as needed for nausea. Patient not taking: Reported on 07/19/2017 03/30/17   Love, Ivan Anchors, PA-C  polyethylene glycol [facility] / GLYCOLAX) packet 17grams in 16 oz of water 1-2 times a day to keep stool liquid/semi liquid conistency 03/30/17   Love, Ivan Anchors, PA-C  pravastatin (PRAVACHOL) 20 MG tablet Take 1 tablet (20 mg total) by mouth every evening. 03/30/17   Love, Ivan Anchors, PA-C  Probiotic Product (PHILLIPS COLON HEALTH PO) Take 1 tablet by mouth daily.    [provider]    Family  History Family History  Problem Relation Age of Onset  . Breast cancer Mother   . Diabetes Father     Social History Social History  Substance Use Topics  . Smoking status: Former Smoker    Quit date: 05/08/1990  . Smokeless tobacco: Never Used  . Alcohol use No     Allergies   Iodine-131; Cephalexin; and Contrast media [iodinated diagnostic agents]   Review of Systems Review of Systems  Respiratory: Positive for cough.   All other systems reviewed and are negative.    Physical Exam Updated Vital Signs BP (!) 144/63 (BP Location: Left Arm)   Pulse (!) 115   Temp 98.7 F (37.1 C) (Oral)  Resp 18   Ht 5\' 3"  (1.6 m)   Wt 103 kg (227 lb)   SpO2 98%   BMI 40.21 kg/m   Physical Exam  Constitutional: She appears well-developed and well-nourished. No distress.  HENT:  Head: Normocephalic and atraumatic.  Eyes: Conjunctivae are normal.  Neck: Neck supple.  Cardiovascular: Normal rate and regular rhythm.   No murmur heard. Pulmonary/Chest: Effort normal and breath sounds normal. No respiratory distress. She has no wheezes.  Harsh cough  Abdominal: Soft. There is no tenderness.  Musculoskeletal: She exhibits no edema.  Neurological: She is alert.  Skin: Skin is warm and dry.  Psychiatric: She has a normal mood and affect.  Nursing note and vitals reviewed.    ED Treatments / Results  Labs (all labs ordered are listed, but only abnormal results are displayed) Labs Reviewed - No data to display  EKG  EKG Interpretation None       Radiology Dg Chest 2 View  Result Date: 07/22/2017 CLINICAL DATA:  Productive cough x 2-3 weeks, Denies sob or cp. History of HTN, heart murmur, hodgkin's lymphoma. Recently traveled to [place]. EXAM: CHEST  2 VIEW COMPARISON:  03/28/2017 FINDINGS: The heart size and mediastinal contours are within normal limits. Possible small nodule projecting in the right upper lobe not evident on the prior study. Lungs otherwise clear. No  pleural effusion or pneumothorax. Stable changes from right breast surgery. Skeletal structures are intact. IMPRESSION: 1. No acute cardiopulmonary disease. 2. Possible small right upper lobe lung nodule. Recommend nonemergent follow-up chest CT for further assessment. Electronically Signed   By: Lajean Manes M.D.   On: 07/22/2017 18:56   Chest xray results discussed with pt.  Pt reports she has a known nodule in right upper lobe.  Pt reports she forgot to mention to tech.   I advised pt to follow up with Dr. Hilma Favors to make sure this is the same area she has had in the past.  Pt advised if this is a new area or there is any question that she needs a Ct scan.   I will treat pt with levaquin and albuterol.   Pt may have partially treated sinusitis,  Possible bronchitis.  I advised pt symptoms may be viral.   She is advised to see Dr. Hilma Favors for recheck Procedures Procedures (including critical care time)  Medications Ordered in ED Medications - No data to display   Initial Impression / Assessment and Plan / ED Course  I have reviewed the triage vital signs and the nursing notes.  Pertinent labs & imaging results that were available during my care of the patient were reviewed by me and considered in my medical decision making (see chart for details).       Final Clinical Impressions(s) / ED Diagnoses   Final diagnoses:  Bronchitis    New Prescriptions New Prescriptions   ALBUTEROL (PROVENTIL HFA;VENTOLIN HFA) 108 (90 BASE) MCG/ACT INHALER    Inhale 2 puffs into the lungs every 4 (four) hours as needed for wheezing or shortness of breath.   LEVOFLOXACIN (LEVAQUIN) 500 MG TABLET    Take 1 tablet (500 mg total) by mouth daily.  An After Visit Summary was printed and given to the patient.    Fransico Meadow, [place] 07/22/17 1944    Fredia Sorrow, MD 07/25/17 [phone]
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
## 2017-07-22 NOTE — Discharge Instructions (Signed)
Return if any problems.

## 2017-07-27 ENCOUNTER — Inpatient Hospital Stay (HOSPITAL_COMMUNITY): Admission: RE | Admit: 2017-07-27 | Payer: 59 | Source: Ambulatory Visit

## 2017-08-01 MED ORDER — ALVIMOPAN 12 MG PO CAPS
12.0000 mg | ORAL_CAPSULE | ORAL | Status: AC
  Administered 2017-08-02: 12 mg via ORAL
  Filled 2017-08-01: qty 1

## 2017-08-01 MED ORDER — CIPROFLOXACIN IN D5W 400 MG/200ML IV SOLN
400.0000 mg | INTRAVENOUS | Status: AC
  Administered 2017-08-02: 400 mg via INTRAVENOUS
  Filled 2017-08-01 (×2): qty 200

## 2017-08-01 MED ORDER — METRONIDAZOLE IN NACL 5-0.79 MG/ML-% IV SOLN
500.0000 mg | INTRAVENOUS | Status: AC
  Administered 2017-08-02: 500 mg via INTRAVENOUS
  Filled 2017-08-01: qty 100

## 2017-08-02 ENCOUNTER — Encounter (HOSPITAL_COMMUNITY)
Admission: RE | Disposition: A | Discharge status: Home / Self Care | Payer: Self-pay | Source: Ambulatory Visit | Attending: Surgery

## 2017-08-02 ENCOUNTER — Inpatient Hospital Stay (HOSPITAL_COMMUNITY): Payer: 59 | Admitting: Vascular Surgery

## 2017-08-02 ENCOUNTER — Inpatient Hospital Stay (HOSPITAL_COMMUNITY): Payer: 59 | Admitting: Certified Registered"

## 2017-08-02 ENCOUNTER — Inpatient Hospital Stay (HOSPITAL_COMMUNITY)
Admission: RE | Admit: 2017-08-02 | Discharge: 2017-08-06 | DRG: 331 | Disposition: A | Discharge status: Home / Self Care | Payer: 59 | Source: Ambulatory Visit | Attending: Surgery | Admitting: Surgery

## 2017-08-02 ENCOUNTER — Encounter (HOSPITAL_COMMUNITY): Payer: Self-pay | Admitting: *Deleted

## 2017-08-02 DIAGNOSIS — K5780 Diverticulitis of intestine, part unspecified, with perforation and abscess without bleeding: Principal | ICD-10-CM | POA: Diagnosis present

## 2017-08-02 DIAGNOSIS — Z7982 Long term (current) use of aspirin: Secondary | ICD-10-CM | POA: Diagnosis not present

## 2017-08-02 DIAGNOSIS — Z888 Allergy status to other drugs, medicaments and biological substances status: Secondary | ICD-10-CM | POA: Diagnosis not present

## 2017-08-02 DIAGNOSIS — K631 Perforation of intestine (nontraumatic): Secondary | ICD-10-CM | POA: Diagnosis present

## 2017-08-02 DIAGNOSIS — Z96652 Presence of left artificial knee joint: Secondary | ICD-10-CM | POA: Diagnosis present

## 2017-08-02 DIAGNOSIS — E785 Hyperlipidemia, unspecified: Secondary | ICD-10-CM | POA: Diagnosis present

## 2017-08-02 DIAGNOSIS — Z433 Encounter for attention to colostomy: Secondary | ICD-10-CM

## 2017-08-02 DIAGNOSIS — Z885 Allergy status to narcotic agent status: Secondary | ICD-10-CM

## 2017-08-02 DIAGNOSIS — E039 Hypothyroidism, unspecified: Secondary | ICD-10-CM | POA: Diagnosis present

## 2017-08-02 DIAGNOSIS — D649 Anemia, unspecified: Secondary | ICD-10-CM | POA: Diagnosis present

## 2017-08-02 DIAGNOSIS — Z23 Encounter for immunization: Secondary | ICD-10-CM | POA: Diagnosis not present

## 2017-08-02 DIAGNOSIS — I10 Essential (primary) hypertension: Secondary | ICD-10-CM | POA: Diagnosis present

## 2017-08-02 DIAGNOSIS — Z79899 Other long term (current) drug therapy: Secondary | ICD-10-CM | POA: Diagnosis not present

## 2017-08-02 HISTORY — PX: COLOSTOMY REVERSAL: SHX5782

## 2017-08-02 LAB — CBC
HCT: 36.4 % (ref 36.0–46.0)
Hemoglobin: 11.8 g/dL — ABNORMAL LOW (ref 12.0–15.0)
MCH: 29.8 pg (ref 26.0–34.0)
MCHC: 32.4 g/dL (ref 30.0–36.0)
MCV: 91.9 fL (ref 78.0–100.0)
Platelets: 231 10*3/uL (ref 150–400)
RBC: 3.96 MIL/uL (ref 3.87–5.11)
RDW: 15.3 % (ref 11.5–15.5)
WBC: 14.9 10*3/uL — AB (ref 4.0–10.5)

## 2017-08-02 LAB — CREATININE, SERUM: CREATININE: 0.7 mg/dL (ref 0.44–1.00)

## 2017-08-02 SURGERY — COLOSTOMY REVERSAL
Anesthesia: General | Site: Abdomen

## 2017-08-02 MED ORDER — ALBUTEROL SULFATE (2.5 MG/3ML) 0.083% IN NEBU: 2.5000 mg | INHALATION_SOLUTION | RESPIRATORY_TRACT | Status: DC | PRN

## 2017-08-02 MED ORDER — ONDANSETRON HCL 4 MG/2ML IJ SOLN
4.0000 mg | Freq: Once | INTRAMUSCULAR | Status: AC | PRN
  Administered 2017-08-02: 4 mg via INTRAVENOUS

## 2017-08-02 MED ORDER — SUGAMMADEX SODIUM 200 MG/2ML IV SOLN
INTRAVENOUS | Status: AC
  Filled 2017-08-02: qty 2

## 2017-08-02 MED ORDER — PROPOFOL 10 MG/ML IV BOLUS
INTRAVENOUS | Status: AC
  Filled 2017-08-02: qty 40

## 2017-08-02 MED ORDER — ACETAMINOPHEN 325 MG PO TABS: 650.0000 mg | ORAL_TABLET | Freq: Four times a day (QID) | ORAL | Status: DC | PRN

## 2017-08-02 MED ORDER — PRAVASTATIN SODIUM 10 MG PO TABS
20.0000 mg | ORAL_TABLET | Freq: Every evening | ORAL | Status: DC
  Administered 2017-08-02 – 2017-08-05 (×4): 20 mg via ORAL
  Filled 2017-08-02 (×4): qty 2

## 2017-08-02 MED ORDER — FENTANYL CITRATE (PF) 100 MCG/2ML IJ SOLN
INTRAMUSCULAR | Status: DC | PRN
  Administered 2017-08-02 (×2): 50 ug via INTRAVENOUS
  Administered 2017-08-02: 100 ug via INTRAVENOUS
  Administered 2017-08-02: 50 ug via INTRAVENOUS

## 2017-08-02 MED ORDER — TRAMADOL HCL 50 MG PO TABS
50.0000 mg | ORAL_TABLET | Freq: Four times a day (QID) | ORAL | Status: DC | PRN
  Administered 2017-08-04: 50 mg via ORAL
  Filled 2017-08-02: qty 1

## 2017-08-02 MED ORDER — CHLORHEXIDINE GLUCONATE CLOTH 2 % EX PADS: 6.0000 | MEDICATED_PAD | Freq: Once | CUTANEOUS | Status: DC

## 2017-08-02 MED ORDER — HYDROMORPHONE HCL 1 MG/ML IJ SOLN
1.0000 mg | INTRAMUSCULAR | Status: DC | PRN
  Administered 2017-08-02 (×2): 1 mg via INTRAVENOUS
  Filled 2017-08-02: qty 1

## 2017-08-02 MED ORDER — ALBUMIN HUMAN 5 % IV SOLN
INTRAVENOUS | Status: DC | PRN
  Administered 2017-08-02 (×2): via INTRAVENOUS

## 2017-08-02 MED ORDER — BENAZEPRIL-HYDROCHLOROTHIAZIDE 20-25 MG PO TABS: 1.0000 | ORAL_TABLET | Freq: Every day | ORAL | Status: DC

## 2017-08-02 MED ORDER — METHOCARBAMOL 500 MG PO TABS
ORAL_TABLET | ORAL | Status: AC
Start: 2017-08-02 — End: 2017-08-02
  Administered 2017-08-02: 500 mg via ORAL
  Filled 2017-08-02: qty 1

## 2017-08-02 MED ORDER — LEVOTHYROXINE SODIUM 88 MCG PO TABS
88.0000 ug | ORAL_TABLET | Freq: Every day | ORAL | Status: DC
  Administered 2017-08-03 – 2017-08-06 (×4): 88 ug via ORAL
  Filled 2017-08-02 (×4): qty 1

## 2017-08-02 MED ORDER — ALVIMOPAN 12 MG PO CAPS
12.0000 mg | ORAL_CAPSULE | Freq: Two times a day (BID) | ORAL | Status: DC
  Administered 2017-08-03 – 2017-08-05 (×6): 12 mg via ORAL
  Filled 2017-08-02 (×6): qty 1

## 2017-08-02 MED ORDER — 0.9 % SODIUM CHLORIDE (POUR BTL) OPTIME
TOPICAL | Status: DC | PRN
  Administered 2017-08-02 (×5): 1000 mL

## 2017-08-02 MED ORDER — BENAZEPRIL HCL 20 MG PO TABS
20.0000 mg | ORAL_TABLET | Freq: Every day | ORAL | Status: DC
Start: 2017-08-02 — End: 2017-08-06
  Administered 2017-08-02 – 2017-08-06 (×5): 20 mg via ORAL
  Filled 2017-08-02 (×5): qty 1

## 2017-08-02 MED ORDER — HYDROMORPHONE HCL 1 MG/ML IJ SOLN
INTRAMUSCULAR | Status: AC
  Filled 2017-08-02: qty 1

## 2017-08-02 MED ORDER — OXYCODONE HCL 5 MG PO TABS
ORAL_TABLET | ORAL | Status: AC
  Administered 2017-08-02: 10 mg via ORAL
  Filled 2017-08-02: qty 2

## 2017-08-02 MED ORDER — DIAZEPAM 2 MG PO TABS: 2.0000 mg | ORAL_TABLET | Freq: Four times a day (QID) | ORAL | Status: DC | PRN

## 2017-08-02 MED ORDER — OXYCODONE HCL 5 MG PO TABS
5.0000 mg | ORAL_TABLET | ORAL | Status: DC | PRN
  Administered 2017-08-02: 10 mg via ORAL
  Administered 2017-08-02: 5 mg via ORAL
  Administered 2017-08-03 (×2): 10 mg via ORAL
  Filled 2017-08-02 (×3): qty 2
  Filled 2017-08-02: qty 1

## 2017-08-02 MED ORDER — PROPOFOL 10 MG/ML IV BOLUS
INTRAVENOUS | Status: DC | PRN
  Administered 2017-08-02: 160 mg via INTRAVENOUS

## 2017-08-02 MED ORDER — ALBUMIN HUMAN 5 % IV SOLN
INTRAVENOUS | Status: DC | PRN
  Administered 2017-08-02: 09:00:00 via INTRAVENOUS

## 2017-08-02 MED ORDER — OXYCODONE HCL 5 MG PO TABS: 5.0000 mg | ORAL_TABLET | Freq: Once | ORAL | Status: DC | PRN

## 2017-08-02 MED ORDER — MIDAZOLAM HCL 5 MG/5ML IJ SOLN
INTRAMUSCULAR | Status: DC | PRN
  Administered 2017-08-02: 0.5 mg via INTRAVENOUS
  Administered 2017-08-02: 1.5 mg via INTRAVENOUS

## 2017-08-02 MED ORDER — MIDAZOLAM HCL 2 MG/2ML IJ SOLN
INTRAMUSCULAR | Status: AC
  Filled 2017-08-02: qty 2

## 2017-08-02 MED ORDER — CIPROFLOXACIN IN D5W 400 MG/200ML IV SOLN
400.0000 mg | Freq: Two times a day (BID) | INTRAVENOUS | Status: AC
  Administered 2017-08-02: 400 mg via INTRAVENOUS
  Filled 2017-08-02 (×2): qty 200

## 2017-08-02 MED ORDER — ENOXAPARIN SODIUM 40 MG/0.4ML ~~LOC~~ SOLN
40.0000 mg | SUBCUTANEOUS | Status: DC
  Administered 2017-08-03 – 2017-08-06 (×4): 40 mg via SUBCUTANEOUS
  Filled 2017-08-02 (×4): qty 0.4

## 2017-08-02 MED ORDER — ONDANSETRON HCL 4 MG/2ML IJ SOLN
INTRAMUSCULAR | Status: AC
  Filled 2017-08-02: qty 2

## 2017-08-02 MED ORDER — LIDOCAINE 2% (20 MG/ML) 5 ML SYRINGE
INTRAMUSCULAR | Status: AC
  Filled 2017-08-02: qty 5

## 2017-08-02 MED ORDER — ONDANSETRON HCL 4 MG/2ML IJ SOLN
INTRAMUSCULAR | Status: AC
Start: 2017-08-02 — End: 2017-08-02
  Administered 2017-08-02: 4 mg via INTRAVENOUS
  Filled 2017-08-02: qty 2

## 2017-08-02 MED ORDER — ZOLPIDEM TARTRATE 5 MG PO TABS: 5.0000 mg | ORAL_TABLET | Freq: Every evening | ORAL | Status: DC | PRN

## 2017-08-02 MED ORDER — SODIUM CHLORIDE 0.9 % IV SOLN
INTRAVENOUS | Status: DC
  Administered 2017-08-02 – 2017-08-03 (×3): via INTRAVENOUS
  Administered 2017-08-04 (×2): 1 mL via INTRAVENOUS
  Administered 2017-08-04: 100 mL/h via INTRAVENOUS
  Administered 2017-08-05 (×2): via INTRAVENOUS
  Administered 2017-08-06: 1 mL via INTRAVENOUS

## 2017-08-02 MED ORDER — HYDROCHLOROTHIAZIDE 25 MG PO TABS
25.0000 mg | ORAL_TABLET | Freq: Every day | ORAL | Status: DC
  Administered 2017-08-02 – 2017-08-06 (×5): 25 mg via ORAL
  Filled 2017-08-02 (×5): qty 1

## 2017-08-02 MED ORDER — SUGAMMADEX SODIUM 200 MG/2ML IV SOLN
INTRAVENOUS | Status: DC | PRN
  Administered 2017-08-02: 206 mg via INTRAVENOUS

## 2017-08-02 MED ORDER — ROCURONIUM BROMIDE 10 MG/ML (PF) SYRINGE
PREFILLED_SYRINGE | INTRAVENOUS | Status: AC
  Filled 2017-08-02: qty 5

## 2017-08-02 MED ORDER — ONDANSETRON HCL 4 MG/2ML IJ SOLN
INTRAMUSCULAR | Status: DC | PRN
  Administered 2017-08-02: 4 mg via INTRAVENOUS

## 2017-08-02 MED ORDER — ROCURONIUM BROMIDE 100 MG/10ML IV SOLN
INTRAVENOUS | Status: DC | PRN
  Administered 2017-08-02 (×2): 10 mg via INTRAVENOUS
  Administered 2017-08-02: 50 mg via INTRAVENOUS
  Administered 2017-08-02 (×3): 10 mg via INTRAVENOUS

## 2017-08-02 MED ORDER — ACETAMINOPHEN 650 MG RE SUPP: 650.0000 mg | Freq: Four times a day (QID) | RECTAL | Status: DC | PRN

## 2017-08-02 MED ORDER — DIPHENHYDRAMINE HCL 25 MG PO CAPS: 25.0000 mg | ORAL_CAPSULE | Freq: Four times a day (QID) | ORAL | Status: DC | PRN

## 2017-08-02 MED ORDER — METHOCARBAMOL 500 MG PO TABS
500.0000 mg | ORAL_TABLET | Freq: Four times a day (QID) | ORAL | Status: DC | PRN
  Administered 2017-08-02 – 2017-08-04 (×4): 500 mg via ORAL
  Filled 2017-08-02 (×3): qty 1

## 2017-08-02 MED ORDER — ONDANSETRON 4 MG PO TBDP: 4.0000 mg | ORAL_TABLET | Freq: Four times a day (QID) | ORAL | Status: DC | PRN

## 2017-08-02 MED ORDER — FENTANYL CITRATE (PF) 100 MCG/2ML IJ SOLN
INTRAMUSCULAR | Status: AC
  Filled 2017-08-02: qty 2

## 2017-08-02 MED ORDER — DIPHENHYDRAMINE HCL 50 MG/ML IJ SOLN: 25.0000 mg | Freq: Four times a day (QID) | INTRAMUSCULAR | Status: DC | PRN

## 2017-08-02 MED ORDER — LIDOCAINE HCL (CARDIAC) 20 MG/ML IV SOLN
INTRAVENOUS | Status: DC | PRN
  Administered 2017-08-02: 40 mg via INTRAVENOUS

## 2017-08-02 MED ORDER — DEXAMETHASONE SODIUM PHOSPHATE 10 MG/ML IJ SOLN
INTRAMUSCULAR | Status: DC | PRN
  Administered 2017-08-02: 10 mg via INTRAVENOUS

## 2017-08-02 MED ORDER — FENTANYL CITRATE (PF) 100 MCG/2ML IJ SOLN
25.0000 ug | INTRAMUSCULAR | Status: DC | PRN
  Administered 2017-08-02 (×3): 50 ug via INTRAVENOUS

## 2017-08-02 MED ORDER — ONDANSETRON HCL 4 MG/2ML IJ SOLN: 4.0000 mg | Freq: Four times a day (QID) | INTRAMUSCULAR | Status: DC | PRN

## 2017-08-02 MED ORDER — FENTANYL CITRATE (PF) 250 MCG/5ML IJ SOLN
INTRAMUSCULAR | Status: AC
  Filled 2017-08-02: qty 5

## 2017-08-02 MED ORDER — HYDRALAZINE HCL 20 MG/ML IJ SOLN: 10.0000 mg | INTRAMUSCULAR | Status: DC | PRN

## 2017-08-02 MED ORDER — METRONIDAZOLE IN NACL 5-0.79 MG/ML-% IV SOLN
500.0000 mg | Freq: Three times a day (TID) | INTRAVENOUS | Status: AC
  Administered 2017-08-02: 500 mg via INTRAVENOUS
  Filled 2017-08-02: qty 100

## 2017-08-02 MED ORDER — LACTATED RINGERS IV SOLN
INTRAVENOUS | Status: DC | PRN
  Administered 2017-08-02 (×2): via INTRAVENOUS

## 2017-08-02 MED ORDER — FENTANYL CITRATE (PF) 100 MCG/2ML IJ SOLN
INTRAMUSCULAR | Status: AC
  Administered 2017-08-02: 50 ug via INTRAVENOUS
  Filled 2017-08-02: qty 2

## 2017-08-02 MED ORDER — OXYCODONE HCL 5 MG/5ML PO SOLN: 5.0000 mg | Freq: Once | ORAL | Status: DC | PRN

## 2017-08-02 MED ORDER — METRONIDAZOLE IN NACL 5-0.79 MG/ML-% IV SOLN: 500.0000 mg | Freq: Three times a day (TID) | INTRAVENOUS | Status: DC

## 2017-08-02 MED ORDER — AMLODIPINE BESYLATE 10 MG PO TABS
10.0000 mg | ORAL_TABLET | Freq: Every evening | ORAL | Status: DC
  Administered 2017-08-02 – 2017-08-05 (×4): 10 mg via ORAL
  Filled 2017-08-02 (×4): qty 1

## 2017-08-02 SURGICAL SUPPLY — 68 items
BLADE CLIPPER SURG (BLADE) IMPLANT
CANISTER SUCT 3000ML PPV (MISCELLANEOUS) ×3 IMPLANT
CHLORAPREP W/TINT 26ML (MISCELLANEOUS) ×3 IMPLANT
COVER MAYO STAND STRL (DRAPES) ×6 IMPLANT
COVER SURGICAL LIGHT HANDLE (MISCELLANEOUS) ×6 IMPLANT
DRAIN CHANNEL 19F RND (DRAIN) ×3 IMPLANT
DRAPE HALF SHEET 40X57 (DRAPES) ×3 IMPLANT
DRAPE LAPAROSCOPIC ABDOMINAL (DRAPES) ×3 IMPLANT
DRAPE UTILITY XL STRL (DRAPES) ×3 IMPLANT
DRAPE WARM FLUID 44X44 (DRAPE) ×3 IMPLANT
DRSG OPSITE POSTOP 3X4 (GAUZE/BANDAGES/DRESSINGS) ×3 IMPLANT
DRSG OPSITE POSTOP 4X10 (GAUZE/BANDAGES/DRESSINGS) ×3 IMPLANT
DRSG OPSITE POSTOP 4X8 (GAUZE/BANDAGES/DRESSINGS) IMPLANT
ELECT BLADE 6.5 EXT (BLADE) ×3 IMPLANT
ELECT CAUTERY BLADE 6.4 (BLADE) ×6 IMPLANT
ELECT REM PT RETURN 9FT ADLT (ELECTROSURGICAL) ×3
ELECTRODE REM PT RTRN 9FT ADLT (ELECTROSURGICAL) ×1 IMPLANT
EVACUATOR SILICONE 100CC (DRAIN) ×3 IMPLANT
GAUZE SPONGE 4X4 12PLY STRL (GAUZE/BANDAGES/DRESSINGS) ×3 IMPLANT
GEL ULTRASOUND 20GR AQUASONIC (MISCELLANEOUS) IMPLANT
GLOVE BIO SURGEON STRL SZ 6.5 (GLOVE) ×4 IMPLANT
GLOVE BIO SURGEON STRL SZ7 (GLOVE) ×33 IMPLANT
GLOVE BIO SURGEONS STRL SZ 6.5 (GLOVE) ×2
GLOVE BIOGEL PI IND STRL 6.5 (GLOVE) ×3 IMPLANT
GLOVE BIOGEL PI IND STRL 7.0 (GLOVE) ×8 IMPLANT
GLOVE BIOGEL PI IND STRL 7.5 (GLOVE) ×4 IMPLANT
GLOVE BIOGEL PI INDICATOR 6.5 (GLOVE) ×6
GLOVE BIOGEL PI INDICATOR 7.0 (GLOVE) ×16
GLOVE BIOGEL PI INDICATOR 7.5 (GLOVE) ×8
GLOVE ECLIPSE 6.5 STRL STRAW (GLOVE) ×9 IMPLANT
GOWN STRL REUS W/ TWL LRG LVL3 (GOWN DISPOSABLE) ×9 IMPLANT
GOWN STRL REUS W/ TWL XL LVL3 (GOWN DISPOSABLE) ×1 IMPLANT
GOWN STRL REUS W/TWL 2XL LVL3 (GOWN DISPOSABLE) ×6 IMPLANT
GOWN STRL REUS W/TWL LRG LVL3 (GOWN DISPOSABLE) ×18
GOWN STRL REUS W/TWL XL LVL3 (GOWN DISPOSABLE) ×2
KIT BASIN OR (CUSTOM PROCEDURE TRAY) ×3 IMPLANT
KIT ROOM TURNOVER OR (KITS) ×3 IMPLANT
LEGGING LITHOTOMY PAIR STRL (DRAPES) ×3 IMPLANT
LIGASURE IMPACT 36 18CM CVD LR (INSTRUMENTS) IMPLANT
NS IRRIG 1000ML POUR BTL (IV SOLUTION) ×15 IMPLANT
PACK GENERAL/GYN (CUSTOM PROCEDURE TRAY) ×3 IMPLANT
PAD ARMBOARD 7.5X6 YLW CONV (MISCELLANEOUS) ×6 IMPLANT
PENCIL BUTTON HOLSTER BLD 10FT (ELECTRODE) ×3 IMPLANT
SPECIMEN JAR MEDIUM (MISCELLANEOUS) ×9 IMPLANT
SPONGE LAP 18X18 X RAY DECT (DISPOSABLE) ×9 IMPLANT
STAPLER CIRC CVD 29MM 37CM (STAPLE) ×3 IMPLANT
STAPLER CUT CVD 40MM GREEN (STAPLE) ×3 IMPLANT
STAPLER VISISTAT 35W (STAPLE) ×6 IMPLANT
SUCTION POOLE TIP (SUCTIONS) ×3 IMPLANT
SURGILUBE 2OZ TUBE FLIPTOP (MISCELLANEOUS) ×6 IMPLANT
SUT ETHILON 2 0 FS 18 (SUTURE) ×3 IMPLANT
SUT NOVA NAB GS-21 0 18 T12 DT (SUTURE) ×3 IMPLANT
SUT PDS AB 1 TP1 96 (SUTURE) ×6 IMPLANT
SUT PROLENE 2 0 CT2 30 (SUTURE) IMPLANT
SUT PROLENE 2 0 KS (SUTURE) IMPLANT
SUT PROLENE 2 0 SH 30 (SUTURE) ×3 IMPLANT
SUT SILK 2 0 SH CR/8 (SUTURE) ×3 IMPLANT
SUT SILK 2 0 TIES 10X30 (SUTURE) ×3 IMPLANT
SUT SILK 3 0 SH CR/8 (SUTURE) ×3 IMPLANT
SUT SILK 3 0 TIES 10X30 (SUTURE) ×3 IMPLANT
SYR BULB IRRIGATION 50ML (SYRINGE) ×3 IMPLANT
TOWEL OR 17X24 6PK STRL BLUE (TOWEL DISPOSABLE) ×3 IMPLANT
TOWEL OR 17X26 10 PK STRL BLUE (TOWEL DISPOSABLE) ×3 IMPLANT
TRAY FOLEY W/METER SILVER 16FR (SET/KITS/TRAYS/PACK) ×3 IMPLANT
TRAY PROCTOSCOPIC FIBER OPTIC (SET/KITS/TRAYS/PACK) ×3 IMPLANT
TUBE CONNECTING 12'X1/4 (SUCTIONS) ×2
TUBE CONNECTING 12X1/4 (SUCTIONS) ×4 IMPLANT
YANKAUER SUCT BULB TIP NO VENT (SUCTIONS) ×3 IMPLANT

## 2017-08-02 NOTE — Anesthesia Postprocedure Evaluation (Signed)
Anesthesia Post Note  Patient: [name]  Procedure(s) Performed: COLOSTOMY REVERSAL (N/A Abdomen)     Patient location during evaluation: PACU Anesthesia Type: General Level of consciousness: awake, awake and alert and oriented Pain management: pain level controlled Vital Signs Assessment: post-procedure vital signs reviewed and stable Respiratory status: spontaneous breathing, nonlabored ventilation and respiratory function stable Cardiovascular status: blood pressure returned to baseline Postop Assessment: no headache Anesthetic complications: no    Last Vitals:  Vitals:   08/02/17 1715 08/02/17 1747  BP:  (!) 136/56  Pulse: 93 100  Resp: 15   Temp: 36.7 C   SpO2: 93% 93%    Last Pain:  Vitals:   08/02/17 1817  TempSrc:   PainSc: 6                  Kaslyn Richburg COKER

## 2017-08-02 NOTE — Op Note (Signed)
Preop diagnosis: Perforated sigmoid colon status post Hartman's procedure Postop diagnosis: Same Procedure performed: Colostomy reversal with stapled anastomosis and rigid sigmoidoscopy Surgeon:Bellamia Ferch K. Asst.: Dr. Jonita Albee Anesthesia: Gen. ETT Indications: This is a 56 year old female who is status post left total knee replacement on 03/12/17. Unfortunately she developed severe constipation and presented 4 days later with perforated diverticulitis. She went to the operating room emergently and had Hartman's procedure. The patient has recovered and her wound is now healed. She presents now for colostomy reversal.  Description of procedure: Patient brought to the operating room placed in a supine position on the operative table. After an adequate level of general anesthesia was obtained, her legs were placed in lithotomy position in yellowfin stirrups.  The stoma was closed with a Pershing suture of 0 silk. Her perineum was prepped with Betadine and her abdomen was prepped with ChloraPrep. We draped sterile fashion. A timeout was taken to ensure the proper patient and proper procedure. We excised her previous scar with a #10 blade and cautery. We dissected down to the subcutaneous tissues to the underlying fascia. In the lower abdomen, the patient has several areas of what appear to be sterile fluid collections right above the fascia. This has resulted in significant thickening of the fascia. We excised the granulation tissue around these fluid collections. We were able entered the peritoneal cavity. The omentum was densely adherent to the undersurface of the abdominal wall. We were able to dissect this away bluntly. We open the fascia long length of the incision. We mobilized the omentum away from the anterior abdominal wall. The Balfour retractor was then placed to provide exposure. We dissected laterally on the left until we were able to palpate the colostomy.  We then dissected down into the  pelvis. We mobilized the small bowel out of the pelvis. There is a loop of small bowel that is adherent to the staple line of the distal stump of the distal sigmoid colon. We are able mobilize this away. We packed the small bowel away in the upper part of the abdomen using the Balfour retractor. We then mobilized the distal stump by dividing some the lateral attachments. We open the presacral space to allow for mobilization. We then made an elliptical incision around the old colostomy. We dissected down in the subcutaneous tissues cautery. Again there were a couple small sterile fluid collections around the fascia that had caused some dense scarring. We were finally able to dissect the stoma away from the surrounding fascia. We mobilized this internally and took down some the lateral attachments. This allowed mobilization of the end of the colostomy down to the pelvis. We amputated the old colostomy back to healthy appearing descending colon. A pursestring suture of 2-0 Prolene was placed around the edge of the colon. EEA sizers were used and we selected a 29 mm EEA stapler. The anvil was placed in the descending colon and the pursestring suture was used to tie the colon around the anvil. I then went below and dilated the anus up to 3 fingers. Patient has several hard mucus balls and the distal stump. We evacuated these manually and with suction. I then tried to pass EEA sizer proximally. There seems to be some fairly dense scarring causing a mild stricture at about 10 cm. We were unable to pass the sizer or the rigid sigmoidoscope pass this area of stricture. We made a decision to resect some of the distal stump. I changed gown and gloves and scrubbed back  in. We used the LigaSure to divide the mesorectum and miso sigmoid colon down to the presacral space. A green Contour stapler was then used to divide the colon just above the peritoneal reflection at the upper rectum. This was sent for pathologic examination. Our  anvil reached easily down into the pelvis without any undue tension. Dr. Marcello Moores then went below and passed the EEA stapler easily to the end of the rectal stump. The spike of the EEA stapler was advanced through the edge of the staple line. This was connected to the anvil and stapler was closed. We held stapled close for a couple of minutes for hemostasis. The stapler was then fired after we made sure that no surrounding structures were occluded in the anastomosis. The stapler was then removed. Rigid sigmoidoscope was then inserted. There is no bleeding from the staple line. We filled the pelvis with saline and insufflated air into the rectum. There is a tiny airleak on the anterior left side of the anastomosis. We placed 3 separate sutures of 3-0 silk to reinforce this area staple line. We again air tested the anastomosis and there is no sign of leak. The rigid sigmoidoscope was removed. We irrigated the pelvis thoroughly and inspected for hemostasis. We removed all of the sponges. A 19 [place] Blake drain was passed through a stab incision in the right lower quadrant and then the drain was placed just anterior to our anastomosis in the pelvis. The drain was sutured in place with 2-0 nylon. We made sure all of our sponges were removed and then we changed gowns, gloves, and instruments in accordance with the colon SSI protocol. We closed the fascia of the ostomy site with 0 Novafil interrupted sutures. The midline fascia was reapproximated with double-stranded #1 PDS suture. Subcutaneous tissues were irrigated. Staples were used to close the skin of the midline incision. We placed 4 loose staples in the old ostomy site. A dry dressing was applied. The patient was then extubated and brought to recovery room in stable condition. All sponge, instrument, and needle counts are correct.  Imogene Burn. Georgette Dover, MD, [facility] Surgery  General/ Trauma Surgery  08/02/2017 11:47 AM

## 2017-08-02 NOTE — H&P (Signed)
History of Present Illness  The patient is a 56 year old female presenting for a post-operative visit. The patient is a 56 year old female presenting for a post-operative visit. Patient is a 56 y.o. female who presented to [facility] on 03/16/17 with LLQ pain four days after left knee replacement by Dr. Noemi Chapel. Workup showed perforated diverticulitis. She was taken to the OR emergently and and underwent Hartmann's procedure. Wound vac was placed over midline abdominal wound. Patient tolerated procedure and was admitted to med-surg. WOC was consulted for management of wound vac and colostomy. POD#1 patient was tolerating clear liquids. POD#3 patient was passing flatus and mobilizing with therapies. POD#5 patient began to have stool output in her colostomy and she was advanced to a soft diet. Patient remained on IV zosyn and vancomycin postoperatively for treatment of intraabdominal infection. On POD#5 patient was noted to have an elevated WBC and low grade fever, workup for source of infection was negative. POD#6 the WBC was coming back down and the patient was afebrile. Vancomycin was stopped 03/21/17 due to elevated creatinine. Zosyn was converted to PO augmentin on 03/23/17 and should be continued for 10 days. Patient is tolerating PO intake, voiding well, mobilizing with therapies, pain is well controlled. Patient was discharged in inpatient rehab on 03/23/17 and home on 03/30/17.   Her appetite is good. Her ostomy is functioning well with good hydration and occasional Miralax. She is not using any pain medication. She is in very good spirits. She is eager to return to work at the [employer].  Her midline wound has healed completely and has no further drainage. She occasionally has some skin irritation around her stoma that oozes with bag changes.   Problem List/Past Medical  WOUND INFECTION AFTER SURGERY (T81.4XXA) PERFORATED SIGMOID COLON (K63.1)  Past Surgical History  Knee Surgery  Left. Tonsillectomy  Diagnostic Studies History  Colonoscopy 1-5 years ago  Allergies  Cephalexin *CEPHALOSPORINS* Codeine Phosphate *ANALGESICS - OPIOID* Allergies Reconciled  Medication History AmLODIPine Besylate (10MG  Tablet, Oral) Active. Benazepril-Hydrochlorothiazide (20-25MG  Tablet, Oral) Active. Levothyroxine Sodium (88MCG Tablet, Oral) Active. Aspirin EC (325MG  Tablet DR, Oral) Active. Medications Reconciled  Social History  Alcohol use Occasional alcohol use. Caffeine use Coffee. No drug use Tobacco use Former smoker.  Family History  Arthritis Mother, Sister. Thyroid problems Daughter, Sister.  Pregnancy / Birth History  Age at menarche 38 years. Age of menopause <43 Gravida 3 Maternal age 15-20 Para 2  Other Problems  Arthritis Cancer Heart murmur High blood pressure Thyroid Disease    Vitals  Weight: 230.6 lb Height: 63in Body Surface Area: 2.05 m Body Mass Index: 40.85 kg/m  Temp.: 97.52F  Pulse: 106 (Regular)  P.OX: 97% (Room air) BP: 124/82 (Sitting, Left Arm, Standard)      Physical Exam   The physical exam findings are as follows: Note:WDWN in NAD Eyes: Pupils equal, round; sclera anicteric HENT: Oral mucosa moist; good dentition Neck: No masses palpated, no thyromegaly Lungs: CTA bilaterally; normal respiratory effort CV: Regular rate and rhythm; no murmurs; extremities well-perfused with no edema Abd: +bowel sounds, soft, non-tender, no palpable organomegaly; healed midline incision LLQ ostomy pink; thick brown stool in bag; no sign of prolapse or hernia Skin: Warm, dry; no sign of jaundice Psychiatric - alert and oriented x 4; calm mood and affect    Assessment & Plan   PERFORATED SIGMOID COLON (K63.1)  Current Plans Schedule for Surgery - colostomy reversal. The surgical procedure has been discussed with the patient. Potential risks,  benefits, alternative  treatments, and expected outcomes have been explained. All of the patient's questions at this time have been answered. The likelihood of reaching the patient's treatment goal is good. The patient understand the proposed surgical procedure and wishes to proceed. Note:Will schedule ostomy reversal in early October  Myka Lukins K. Georgette Dover, MD, [facility] Surgery  General/ Trauma Surgery  08/02/2017 7:15 AM

## 2017-08-02 NOTE — Anesthesia Preprocedure Evaluation (Signed)
Anesthesia Evaluation  Patient identified by MRN, date of birth, ID band Patient awake    Reviewed: Allergy & Precautions, NPO status , Patient's Chart, lab work & pertinent test results  Airway Mallampati: II  TM Distance: >3 FB Neck ROM: Full    Dental  (+) Edentulous Upper, Dental Advisory Given   Pulmonary former smoker,    breath sounds clear to auscultation       Cardiovascular hypertension,  Rhythm:Regular Rate:Normal     Neuro/Psych    GI/Hepatic   Endo/Other    Renal/GU      Musculoskeletal   Abdominal (+) + obese,   Peds  Hematology   Anesthesia Other Findings   Reproductive/Obstetrics                             Anesthesia Physical Anesthesia Plan  ASA: III  Anesthesia Plan: General   Post-op Pain Management:    Induction: Intravenous  PONV Risk Score and Plan: 1 and Ondansetron and Dexamethasone  Airway Management Planned: Oral ETT  Additional Equipment:   Intra-op Plan:   Post-operative Plan: Extubation in OR  Informed Consent: I have reviewed the patients History and Physical, chart, labs and discussed the procedure including the risks, benefits and alternatives for the proposed anesthesia with the patient or authorized representative who has indicated his/her understanding and acceptance.   Dental advisory given  Plan Discussed with: CRNA and Anesthesiologist  Anesthesia Plan Comments:         Anesthesia Quick Evaluation

## 2017-08-02 NOTE — Transfer of Care (Signed)
Immediate Anesthesia Transfer of Care Note  Patient: [name]  Procedure(s) Performed: COLOSTOMY REVERSAL (N/A Abdomen)  Patient Location: PACU  Anesthesia Type:General  Level of Consciousness: awake, alert , oriented and sedated  Airway & Oxygen Therapy: Patient Spontanous Breathing and Patient connected to nasal cannula oxygen  Post-op Assessment: Report given to RN, Post -op Vital signs reviewed and stable and Patient moving all extremities  Post vital signs: Reviewed and stable  Last Vitals:  Vitals:   08/02/17 0558  BP: (!) 178/67  Pulse: 96  Resp: 20  Temp: 36.7 C  SpO2: 99%    Last Pain:  Vitals:   08/02/17 0558  TempSrc: Oral      Patients Stated Pain Goal: 3 (11/29/41 8887)  Complications: No apparent anesthesia complications

## 2017-08-02 NOTE — Anesthesia Procedure Notes (Signed)
Procedure Name: Intubation Date/Time: 08/02/2017 7:47 AM Performed by: Scheryl Darter Pre-anesthesia Checklist: Patient identified, Emergency Drugs available, Suction available and Patient being monitored Patient Re-evaluated:Patient Re-evaluated prior to induction Oxygen Delivery Method: Circle System Utilized Preoxygenation: Pre-oxygenation with 100% oxygen Induction Type: IV induction Ventilation: Mask ventilation without difficulty Laryngoscope Size: Miller and 2 Grade View: Grade I Tube type: Oral Tube size: 7.0 mm Number of attempts: 1 Airway Equipment and Method: Stylet and Oral airway Placement Confirmation: ETT inserted through vocal cords under direct vision,  positive ETCO2 and breath sounds checked- equal and bilateral Secured at: 21 cm Tube secured with: Tape Dental Injury: Teeth and Oropharynx as per pre-operative assessment

## 2017-08-02 NOTE — Progress Notes (Signed)
Patient arrived to 6n24 alert and oriented, IV fluids, foley intact pain mild, VSS. Patient noted to have a midline incision with clean honeycomb dressing and two lap sites with gauze that were clean and intact. One JP drain noted on right side. Oriented to room and staff, family at beside will continue to monitor.

## 2017-08-03 ENCOUNTER — Encounter (HOSPITAL_COMMUNITY): Payer: Self-pay | Admitting: Surgery

## 2017-08-03 LAB — BASIC METABOLIC PANEL
Anion gap: 6 (ref 5–15)
BUN: 7 mg/dL (ref 6–20)
CALCIUM: 8.3 mg/dL — AB (ref 8.9–10.3)
CO2: 25 mmol/L (ref 22–32)
CREATININE: 0.57 mg/dL (ref 0.44–1.00)
Chloride: 104 mmol/L (ref 101–111)
GFR calc non Af Amer: >60 mL/min (ref >60)
GLUCOSE: 132 mg/dL — AB (ref 65–99)
Potassium: 3.6 mmol/L (ref 3.5–5.1)
Sodium: 135 mmol/L (ref 135–145)

## 2017-08-03 LAB — CBC
HEMATOCRIT: 33.2 % — AB (ref 36.0–46.0)
Hemoglobin: 10.7 g/dL — ABNORMAL LOW (ref 12.0–15.0)
MCH: 29.6 pg (ref 26.0–34.0)
MCHC: 32.2 g/dL (ref 30.0–36.0)
MCV: 92 fL (ref 78.0–100.0)
PLATELETS: 209 10*3/uL (ref 150–400)
RBC: 3.61 MIL/uL — ABNORMAL LOW (ref 3.87–5.11)
RDW: 15.8 % — AB (ref 11.5–15.5)
WBC: 13.6 10*3/uL — ABNORMAL HIGH (ref 4.0–10.5)

## 2017-08-03 MED ORDER — INFLUENZA VAC SPLIT QUAD 0.5 ML IM SUSY
0.5000 mL | PREFILLED_SYRINGE | INTRAMUSCULAR | Status: AC
  Administered 2017-08-04: 0.5 mL via INTRAMUSCULAR
  Filled 2017-08-03: qty 0.5

## 2017-08-03 MED ORDER — KETOROLAC TROMETHAMINE 30 MG/ML IJ SOLN
30.0000 mg | Freq: Four times a day (QID) | INTRAMUSCULAR | Status: DC
  Administered 2017-08-03 – 2017-08-06 (×11): 30 mg via INTRAVENOUS
  Filled 2017-08-03 (×11): qty 1

## 2017-08-03 MED ORDER — SIMETHICONE 80 MG PO CHEW
80.0000 mg | CHEWABLE_TABLET | Freq: Four times a day (QID) | ORAL | Status: DC | PRN
  Administered 2017-08-03 – 2017-08-05 (×6): 80 mg via ORAL
  Filled 2017-08-03 (×6): qty 1

## 2017-08-03 NOTE — Progress Notes (Signed)
1 Day Post-Op   Subjective/Chief Complaint: Sore mostly on left side near old stoma site Mild nausea Passing flatus already Using only PO pain meds   Objective: Vital signs in last 24 hours: Temp:  [98 F (36.7 C)-98.9 F (37.2 C)] 98.2 F (36.8 C) (10/05 0615) Pulse Rate:  [82-100] 89 (10/05 0615) Resp:  [12-22] 18 (10/05 0615) BP: (104-142)/(45-69) 104/45 (10/05 0615) SpO2:  [91 %-97 %] 96 % (10/05 0615) FiO2 (%):  [2 %] 2 % (10/04 2215) Last BM Date: 08/01/17  Intake/Output from previous day: 10/04 0701 - 10/05 0700 In: 0973 [P.O.:1240; I.V.:1600; IV Piggyback:500] Out: 3910 [Urine:3550; Drains:110; Blood:250] Intake/Output this shift: No intake/output data recorded.  General appearance: alert, cooperative and no distress Resp: clear to auscultation bilaterally Cardio: regular rate and rhythm, S1, S2 normal, no murmur, click, rub or gallop GI: soft, tender along incision/ LLQ ostomy site; drain with serosanguinous output Midline incision c/d/i with honeycomb; LLQ incision - staple line - minimal drainage  Lab Results:   Recent Labs  08/02/17 1944 08/03/17 0447  WBC 14.9* 13.6*  HGB 11.8* 10.7*  HCT 36.4 33.2*  PLT 231 209   BMET  Recent Labs  08/02/17 1944 08/03/17 0447  NA  --  135  K  --  3.6  CL  --  104  CO2  --  25  GLUCOSE  --  132*  BUN  --  7  CREATININE 0.70 0.57  CALCIUM  --  8.3*   PT/INR No results for input(s): LABPROT, INR in the last 72 hours. ABG No results for input(s): PHART, HCO3 in the last 72 hours.  Invalid input(s): PCO2, PO2  Studies/Results: No results found.  Anti-infectives: Anti-infectives    Start     Dose/Rate Route Frequency Ordered Stop   08/02/17 2000  ciprofloxacin (CIPRO) IVPB 400 mg     400 mg 200 mL/hr over 60 Minutes Intravenous Every 12 hours 08/02/17 1602 08/02/17 2209   08/02/17 1630  metroNIDAZOLE (FLAGYL) IVPB 500 mg     500 mg 100 mL/hr over 60 Minutes Intravenous Every 8 hours 08/02/17 1604  08/02/17 1746   08/02/17 1615  metroNIDAZOLE (FLAGYL) IVPB 500 mg  Status:  Discontinued     500 mg 100 mL/hr over 60 Minutes Intravenous Every 8 hours 08/02/17 1602 08/02/17 1604   08/02/17 0600  metroNIDAZOLE (FLAGYL) IVPB 500 mg     500 mg 100 mL/hr over 60 Minutes Intravenous To [facility]Stay [facility] 08/01/17 1258 08/02/17 0808   08/02/17 0600  ciprofloxacin (CIPRO) IVPB 400 mg     400 mg 200 mL/hr over 60 Minutes Intravenous On call to O.R. 08/01/17 1258 08/02/17 0741      Assessment/Plan: s/p Procedure(s): COLOSTOMY REVERSAL (N/A)  08/02/17 - Tracey Hermance Advance diet  Leave drain in place for now Abdominal binder - encourage ambulation Dry dressing changes to LLQ site Foley out  - voiding Probable discharge Monday  LOS: 1 day    Kateleen Encarnacion K. 08/03/2017

## 2017-08-04 LAB — BASIC METABOLIC PANEL
ANION GAP: 8 (ref 5–15)
BUN: 12 mg/dL (ref 6–20)
CALCIUM: 8.1 mg/dL — AB (ref 8.9–10.3)
CO2: 25 mmol/L (ref 22–32)
CREATININE: 0.65 mg/dL (ref 0.44–1.00)
Chloride: 106 mmol/L (ref 101–111)
Glucose, Bld: 97 mg/dL (ref 65–99)
Potassium: 3.6 mmol/L (ref 3.5–5.1)
SODIUM: 139 mmol/L (ref 135–145)

## 2017-08-04 LAB — CBC
HEMATOCRIT: 30.8 % — AB (ref 36.0–46.0)
Hemoglobin: 9.9 g/dL — ABNORMAL LOW (ref 12.0–15.0)
MCH: 30.1 pg (ref 26.0–34.0)
MCHC: 32.1 g/dL (ref 30.0–36.0)
MCV: 93.6 fL (ref 78.0–100.0)
PLATELETS: 202 10*3/uL (ref 150–400)
RBC: 3.29 MIL/uL — ABNORMAL LOW (ref 3.87–5.11)
RDW: 16.2 % — AB (ref 11.5–15.5)
WBC: 12.7 10*3/uL — AB (ref 4.0–10.5)

## 2017-08-04 NOTE — Progress Notes (Signed)
Patient ID: [name], female   DOB: December 04, 1960, 56 y.o.   MRN: [id number]  [facility] Surgery Progress Note  2 Days Post-Op  Subjective: CC-  States that her abdomen is sore but pain well controlled. No n/v. Tolerating full liquids. Passing flatus, no BM.   Objective: Vital signs in last 24 hours: Temp:  [97.6 F (36.4 C)-98.6 F (37 C)] 97.6 F (36.4 C) (10/06 0514) Pulse Rate:  [77-88] 77 (10/06 0514) Resp:  [18] 18 (10/06 0514) BP: (115-126)/(47-62) 126/51 (10/06 0514) SpO2:  [94 %-100 %] 94 % (10/06 0514) Last BM Date: 08/01/17  Intake/Output from previous day: 10/05 0701 - 10/06 0700 In: 3085 [P.O.:860; I.V.:2225] Out: 540 [Urine:425; Drains:115] Intake/Output this shift: No intake/output data recorded.  PE: Gen:  Alert, NAD, pleasant HEENT: EOM's intact, pupils equal and round Card:  RRR, no M/G/R heard Pulm:  CTAB, no W/R/R, effort normal Abd: Soft, ND, +BS, midline incision C/D/I with staples intact and scant amount bloody drainage on honeycomb dressing, LLQ incision cdi with staples intact and no erythema or drainage, RLQ drain with serosanguinous drainage Psych: A&Ox3   Lab Results:   Recent Labs  08/03/17 0447 08/04/17 0044  WBC 13.6* 12.7*  HGB 10.7* 9.9*  HCT 33.2* 30.8*  PLT 209 202   BMET  Recent Labs  08/03/17 0447 08/04/17 0044  NA 135 139  K 3.6 3.6  CL 104 106  CO2 25 25  GLUCOSE 132* 97  BUN 7 12  CREATININE 0.57 0.65  CALCIUM 8.3* 8.1*   PT/INR No results for input(s): LABPROT, INR in the last 72 hours. CMP     Component Value Date/Time   NA 139 08/04/2017 0044   K 3.6 08/04/2017 0044   CL 106 08/04/2017 0044   CO2 25 08/04/2017 0044   GLUCOSE 97 08/04/2017 0044   BUN 12 08/04/2017 0044   CREATININE 0.65 08/04/2017 0044   CALCIUM 8.1 (L) 08/04/2017 0044   PROT 5.8 (L) 03/24/2017 0420   ALBUMIN 1.8 (L) 03/24/2017 0420   AST 25 03/24/2017 0420   ALT 33 03/24/2017 0420   ALKPHOS 64 03/24/2017 0420   BILITOT 0.4  03/24/2017 0420   GFRNONAA >60 08/04/2017 0044   GFRAA >60 08/04/2017 0044   Lipase     Component Value Date/Time   LIPASE 16 03/16/2017 2347       Studies/Results: No results found.  Anti-infectives: Anti-infectives    Start     Dose/Rate Route Frequency Ordered Stop   08/02/17 2000  ciprofloxacin (CIPRO) IVPB 400 mg     400 mg 200 mL/hr over 60 Minutes Intravenous Every 12 hours 08/02/17 1602 08/02/17 2209   08/02/17 1630  metroNIDAZOLE (FLAGYL) IVPB 500 mg     500 mg 100 mL/hr over 60 Minutes Intravenous Every 8 hours 08/02/17 1604 08/02/17 1746   08/02/17 1615  metroNIDAZOLE (FLAGYL) IVPB 500 mg  Status:  Discontinued     500 mg 100 mL/hr over 60 Minutes Intravenous Every 8 hours 08/02/17 1602 08/02/17 1604   08/02/17 0600  metroNIDAZOLE (FLAGYL) IVPB 500 mg     500 mg 100 mL/hr over 60 Minutes Intravenous To [facility] 08/01/17 1258 08/02/17 0808   08/02/17 0600  ciprofloxacin (CIPRO) IVPB 400 mg     400 mg 200 mL/hr over 60 Minutes Intravenous On call to O.R. 08/01/17 1258 08/02/17 0741       Assessment/Plan HTN HLD Hypothyroidism Anemia - Hg 9.9 stable, monitor  Perforated sigmoid colon status post  Hartman's procedure S/p Colostomy reversal with stapled anastomosis and rigid sigmoidoscopy 10/4 Dr. Georgette Farmer - POD 2 - on Entereg - drain with 115cc/24hr serosanguinous fluid - WBC trending down 12.7, afebrile - passing flatus, no BM  ID - abx perioperative, none currently FEN - IVF, FLD VTE - SCDs, lovenox Foley - out  Plan - Continue full liquids and Entereg until more return in bowel function. Encourage ambulation today. Continue drain. Labs in AM.   LOS: 2 days    [name] , [facility] 08/04/2017, 8:31 AM Pager: [phone] Consults: [phone] Mon-Fri 7:00 am-4:30 pm Sat-Sun 7:00 am-11:30 am

## 2017-08-05 LAB — CBC
HEMATOCRIT: 32 % — AB (ref 36.0–46.0)
HEMOGLOBIN: 10.1 g/dL — AB (ref 12.0–15.0)
MCH: 29.6 pg (ref 26.0–34.0)
MCHC: 31.6 g/dL (ref 30.0–36.0)
MCV: 93.8 fL (ref 78.0–100.0)
Platelets: 201 10*3/uL (ref 150–400)
RBC: 3.41 MIL/uL — AB (ref 3.87–5.11)
RDW: 16.4 % — ABNORMAL HIGH (ref 11.5–15.5)
WBC: 8.7 10*3/uL (ref 4.0–10.5)

## 2017-08-05 LAB — BASIC METABOLIC PANEL
Anion gap: 9 (ref 5–15)
BUN: 6 mg/dL (ref 6–20)
CHLORIDE: 105 mmol/L (ref 101–111)
CO2: 25 mmol/L (ref 22–32)
Calcium: 8.1 mg/dL — ABNORMAL LOW (ref 8.9–10.3)
Creatinine, Ser: 0.52 mg/dL (ref 0.44–1.00)
GFR calc non Af Amer: >60 mL/min (ref >60)
Glucose, Bld: 89 mg/dL (ref 65–99)
POTASSIUM: 3.6 mmol/L (ref 3.5–5.1)
SODIUM: 139 mmol/L (ref 135–145)

## 2017-08-05 NOTE — Progress Notes (Signed)
Patient ID: [name], female   DOB: Sep 06, 1961, 56 y.o.   MRN: [id number]  [facility] Surgery Progress Note  3 Days Post-Op  Subjective: CC-  Sitting up in chair. Ambulated in halls multiple times yesterday. Passing flatus, no BM. Tolerating full liquids. Denies n/v.  Objective: Vital signs in last 24 hours: Temp:  [97.8 F (36.6 C)-98.5 F (36.9 C)] 97.8 F (36.6 C) (10/07 0422) Pulse Rate:  [81-91] 81 (10/07 0422) Resp:  [16-18] 16 (10/07 0422) BP: (105-117)/(44-52) 117/49 (10/07 0422) SpO2:  [97 %-98 %] 97 % (10/07 0422) Last BM Date: 08/01/17  Intake/Output from previous day: 10/06 0701 - 10/07 0700 In: 3160 [P.O.:960; I.V.:2200] Out: 55 [Drains:55] Intake/Output this shift: Total I/O In: -  Out: 5 [Drains:5]  PE: Gen:  Alert, NAD, pleasant HEENT: EOM's intact, pupils equal and round Pulm:  CTAB, no W/R/R, effort normal Abd: Soft, ND, +BS, midline incision C/D/I with staples intact and honeycomb dressing in place with some dried blood, LLQ incision cdi with staples intact and no erythema or drainage, RLQ drain with serosanguinous drainage Psych: A&Ox3  Lab Results:   Recent Labs  08/04/17 0044 08/05/17 0427  WBC 12.7* 8.7  HGB 9.9* 10.1*  HCT 30.8* 32.0*  PLT 202 201   BMET  Recent Labs  08/04/17 0044 08/05/17 0427  NA 139 139  K 3.6 3.6  CL 106 105  CO2 25 25  GLUCOSE 97 89  BUN 12 6  CREATININE 0.65 0.52  CALCIUM 8.1* 8.1*   PT/INR No results for input(s): LABPROT, INR in the last 72 hours. CMP     Component Value Date/Time   NA 139 08/05/2017 0427   K 3.6 08/05/2017 0427   CL 105 08/05/2017 0427   CO2 25 08/05/2017 0427   GLUCOSE 89 08/05/2017 0427   BUN 6 08/05/2017 0427   CREATININE 0.52 08/05/2017 0427   CALCIUM 8.1 (L) 08/05/2017 0427   PROT 5.8 (L) 03/24/2017 0420   ALBUMIN 1.8 (L) 03/24/2017 0420   AST 25 03/24/2017 0420   ALT 33 03/24/2017 0420   ALKPHOS 64 03/24/2017 0420   BILITOT 0.4 03/24/2017 0420   GFRNONAA  >60 08/05/2017 0427   GFRAA >60 08/05/2017 0427   Lipase     Component Value Date/Time   LIPASE 16 03/16/2017 2347       Studies/Results: No results found.  Anti-infectives: Anti-infectives    Start     Dose/Rate Route Frequency Ordered Stop   08/02/17 2000  ciprofloxacin (CIPRO) IVPB 400 mg     400 mg 200 mL/hr over 60 Minutes Intravenous Every 12 hours 08/02/17 1602 08/02/17 2209   08/02/17 1630  metroNIDAZOLE (FLAGYL) IVPB 500 mg     500 mg 100 mL/hr over 60 Minutes Intravenous Every 8 hours 08/02/17 1604 08/02/17 1746   08/02/17 1615  metroNIDAZOLE (FLAGYL) IVPB 500 mg  Status:  Discontinued     500 mg 100 mL/hr over 60 Minutes Intravenous Every 8 hours 08/02/17 1602 08/02/17 1604   08/02/17 0600  metroNIDAZOLE (FLAGYL) IVPB 500 mg     500 mg 100 mL/hr over 60 Minutes Intravenous To [facility]Stay Surgical 08/01/17 1258 08/02/17 0808   08/02/17 0600  ciprofloxacin (CIPRO) IVPB 400 mg     400 mg 200 mL/hr over 60 Minutes Intravenous On call to O.R. 08/01/17 1258 08/02/17 0741       Assessment/Plan HTN HLD Hypothyroidism Anemia - Hg 10.1 stable  Perforated sigmoid colon status post Hartman's procedure  S/p Colostomy  reversal with stapled anastomosis and rigid sigmoidoscopy 10/4 Dr. Georgette Dover - POD 3 - on Entereg - drain with 55cc/24hr serosanguinous fluid - WBC trending down 8.7, afebrile - passing flatus, no BM  ID - abx perioperative, none currently FEN - decrease IVF, soft diet VTE - SCDs, lovenox Foley - out  Plan - Tolerating full liquids and passing flatus, no BM. Discussed with MD, will advance to soft diet. Continue Entereg and drain. Mobilize.   LOS: 3 days    Wellington Hampshire , [facility] 08/05/2017, 8:28 AM Pager: [phone] Consults: [phone] Mon-Fri 7:00 am-4:30 pm Sat-Sun 7:00 am-11:30 am

## 2017-08-06 MED ORDER — OXYCODONE HCL 5 MG PO TABS: 5.0000 mg | ORAL_TABLET | ORAL | 0 refills | Status: DC | PRN

## 2017-08-06 NOTE — Discharge Summary (Signed)
Physician Discharge Summary  Patient ID: [name] MRN: [id number] DOB/AGE: 1961/05/23 56 y.o.  Admit date: 08/02/2017 Discharge date: 08/06/2017  Admission Diagnoses:  Sigmoid colon perforation s/p Hartman's procedure  Discharge Diagnoses: s/p colostomy reversal Active Problems:   Perforation of sigmoid colon [facility])   Discharged Condition: good  Hospital Course: Open colostomy reversal 08/02/17.  Stapled EEA anastomosis.  WBC returned to normal.  Drainage remained serosanguinous.  Began having bowel movements on POD #3.  Pain controlled with Toradol.  Ready for discharge.   Consults: None  Significant Diagnostic Studies: labs:  Lab Results  Component Value Date   WBC 8.7 08/05/2017   HGB 10.1 (L) 08/05/2017   HCT 32.0 (L) 08/05/2017   MCV 93.8 08/05/2017   PLT 201 08/05/2017   Lab Results  Component Value Date   CREATININE 0.52 08/05/2017   BUN 6 08/05/2017   NA 139 08/05/2017   K 3.6 08/05/2017   CL 105 08/05/2017   CO2 25 08/05/2017     Treatments: surgery: colostomy reversal 08/02/17  Discharge Exam: Blood pressure (!) 117/57, pulse 88, temperature 98 F (36.7 C), resp. rate 17, SpO2 97 %. General appearance: alert, cooperative and no distress GI: soft, incisional tenderness; drain removed today without difficulty Incisions c/d/i  Disposition: 01-Home or Self Care  Discharge Instructions    Call MD for:  persistant nausea and vomiting    Complete by:  As directed    Call MD for:  redness, tenderness, or signs of infection (pain, swelling, redness, odor or green/yellow discharge around incision site)    Complete by:  As directed    Call MD for:  severe uncontrolled pain    Complete by:  As directed    Call MD for:  temperature >100.4    Complete by:  As directed    Diet general    Complete by:  As directed    Driving Restrictions    Complete by:  As directed    Do not drive while taking pain medications   Increase activity slowly    Complete by:  As  directed    May shower / Bathe    Complete by:  As directed      Allergies as of 08/06/2017      Reactions   Cephalexin Rash   Contrast Media [iodinated Diagnostic Agents] Nausea And Vomiting   Iodine-131 Nausea And Vomiting      Medication List    STOP taking these medications   amoxicillin-clavulanate 875-125 MG tablet Commonly known as:  AUGMENTIN   levofloxacin 500 MG tablet Commonly known as:  LEVAQUIN   polyethylene glycol packet Commonly known as:  MIRALAX / GLYCOLAX     TAKE these medications   acetaminophen 325 MG tablet Commonly known as:  TYLENOL Take 1-2 tablets (325-650 mg total) by mouth every 6 (six) hours as needed for mild pain.   albuterol 108 (90 Base) MCG/ACT inhaler Commonly known as:  PROVENTIL HFA;VENTOLIN HFA Inhale 2 puffs into the lungs every 4 (four) hours as needed for wheezing or shortness of breath.   amLODipine 10 MG tablet Commonly known as:  NORVASC Take 1 tablet (10 mg total) by mouth every evening.   aspirin EC 81 MG tablet Take 81 mg by mouth daily.   benazepril 20 MG tablet Commonly known as:  LOTENSIN Take 1 tablet (20 mg total) by mouth daily.   benazepril-hydrochlorthiazide 20-25 MG tablet Commonly known as:  LOTENSIN HCT Take 1 tablet by mouth daily.  cetirizine 10 MG tablet Commonly known as:  ZYRTEC Take 10 mg by mouth daily.   diazepam 2 MG tablet Commonly known as:  VALIUM Take 2 mg by mouth as needed.   diphenhydrAMINE 25 mg capsule Commonly known as:  BENADRYL Take 25 mg by mouth at bedtime.   iron polysaccharides 150 MG capsule Commonly known as:  NIFEREX Take 1 capsule (150 mg total) by mouth 2 (two) times daily before lunch and supper.   levothyroxine 88 MCG tablet Commonly known as:  SYNTHROID, LEVOTHROID Take 88 mcg by mouth daily before breakfast.   methocarbamol 500 MG tablet Commonly known as:  ROBAXIN Take 0.5-1 tablets (250-500 mg total) by mouth every 8 (eight) hours as needed for muscle  spasms.   multivitamin Tabs tablet Take 1 tablet by mouth daily.   ondansetron 4 MG disintegrating tablet Commonly known as:  ZOFRAN-ODT Take 1 tablet (4 mg total) by mouth every 6 (six) hours as needed for nausea.   oxyCODONE 5 MG immediate release tablet Commonly known as:  Oxy IR/ROXICODONE Take 1 tablet (5 mg total) by mouth every 4 (four) hours as needed for moderate pain.   PHILLIPS COLON HEALTH PO Take 1 tablet by mouth daily.   pravastatin 20 MG tablet Commonly known as:  PRAVACHOL Take 1 tablet (20 mg total) by mouth every evening.      Follow-up Information    Donnie Mesa, MD. Schedule an appointment as soon as possible for a visit in 1 week(s).   Specialty:  General Surgery Contact information: [address] [place] [address] [phone]           Signed: Maia Petties. 08/06/2017, 8:23 AM

## 2017-08-06 NOTE — Discharge Instructions (Signed)
[facility]      [facility] [phone]  OPEN ABDOMINAL SURGERY: POST OP INSTRUCTIONS  Always review your discharge instruction sheet given to you by the facility where your surgery was performed.  IF YOU HAVE DISABILITY OR FAMILY LEAVE FORMS, YOU MUST BRING THEM TO THE OFFICE FOR PROCESSING.  PLEASE DO NOT GIVE THEM TO YOUR DOCTOR.  1. A prescription for pain medication may be given to you upon discharge.  Take your pain medication as prescribed, if needed.  If narcotic pain medicine is not needed, then you may take acetaminophen (Tylenol) or ibuprofen (Advil) as needed. 2. Take your usually prescribed medications unless otherwise directed. 3. If you need a refill on your pain medication, please contact your pharmacy. They will contact our office to request authorization.  Prescriptions will not be filled after 5pm or on week-ends. 4. You should follow a light diet the first few days after arrival home, such as soup and crackers, pudding, etc.unless your doctor has advised otherwise. A high-fiber, low fat diet can be resumed as tolerated.   Be sure to include lots of fluids daily. Most patients will experience some swelling and bruising on the chest and neck area.  Ice packs will help.  Swelling and bruising can take several days to resolve 5. Most patients will experience some swelling and bruising in the area of the incision. Ice pack will help. Swelling and bruising can take several days to resolve..  6. It is common to experience some constipation if taking pain medication after surgery.  Increasing fluid intake and taking a stool softener will usually help or prevent this problem from occurring.  A mild laxative (Milk of Magnesia or Miralax) should be taken according to package directions if there are no bowel movements after 48 hours. 7.  You may have steri-strips (small skin tapes) in place directly over the incision.  These strips should be left on the skin for 7-10 days.  If your  surgeon used skin glue on the incision, you may shower in 24 hours.  The glue will flake off over the next 2-3 weeks.  Any sutures or staples will be removed at the office during your follow-up visit. You may find that a light gauze bandage over your incision may keep your staples from being rubbed or pulled. You may shower and replace the bandage daily. 8. ACTIVITIES:  You may resume regular (light) daily activities beginning the next day--such as daily self-care, walking, climbing stairs--gradually increasing activities as tolerated.  You may have sexual intercourse when it is comfortable.  Refrain from any heavy lifting or straining until approved by your doctor. a. You may drive when you no longer are taking prescription pain medication, you can comfortably wear a seatbelt, and you can safely maneuver your car and apply brakes b. Return to Work: ___________________________________ 9. You should see your doctor in the office for a follow-up appointment approximately two weeks after your surgery.  Make sure that you call for this appointment within a day or two after you arrive home to insure a convenient appointment time. OTHER INSTRUCTIONS:  _____________________________________________________________ _____________________________________________________________  WHEN TO CALL YOUR DOCTOR: 1. Fever over 101.0 2. Inability to urinate 3. Nausea and/or vomiting 4. Extreme swelling or bruising 5. Continued bleeding from incision. 6. Increased pain, redness, or drainage from the incision. 7. Difficulty swallowing or breathing 8. Muscle cramping or spasms. 9. Numbness or tingling in hands or feet or around lips.  The clinic staff is available to   answer your questions during regular business hours.  Please don't hesitate to call and ask to speak to one of the nurses if you have concerns.  For further questions, please visit [email or web address]   

## 2017-08-06 NOTE — Progress Notes (Signed)
Pt is feeling a lot better. Tolerating soft diet. Had a BM last night. Denies pain. Discharge instructions given to pt. Discharged to home accompanied by spouse.

## 2017-10-26 DIAGNOSIS — Z9221 Personal history of antineoplastic chemotherapy: Secondary | ICD-10-CM | POA: Diagnosis not present

## 2017-10-26 DIAGNOSIS — Z9484 Stem cells transplant status: Secondary | ICD-10-CM | POA: Diagnosis not present

## 2017-10-26 DIAGNOSIS — Z08 Encounter for follow-up examination after completed treatment for malignant neoplasm: Secondary | ICD-10-CM | POA: Diagnosis not present

## 2017-10-26 DIAGNOSIS — C8100 Nodular lymphocyte predominant Hodgkin lymphoma, unspecified site: Secondary | ICD-10-CM | POA: Diagnosis not present

## 2017-10-26 DIAGNOSIS — Z8571 Personal history of Hodgkin lymphoma: Secondary | ICD-10-CM | POA: Diagnosis not present

## 2017-10-30 IMAGING — NM NM MISC PROCEDURE
3 series · 18 of 18 positions shown · non-contrast
Comparison: none

[Series 1: rest-mc_(id)_sa · 6.4mm · 6.40mm/px · 6 of 64 frames shown]
[frame 6/64]
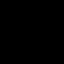
[frame 16/64]
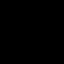
[frame 27/64]
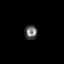
[frame 38/64]
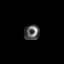
[frame 48/64]
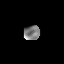
[frame 59/64]
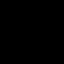

[Series 1: stress-gsp_(id)_sa · 6.4mm · 6.40mm/px · 6 of 512 frames shown]
[frame 43/512]
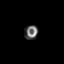
[frame 128/512]
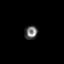
[frame 214/512]
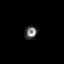
[frame 299/512]
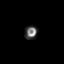
[frame 384/512]
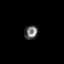
[frame 470/512]
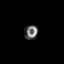

[Series 1: stress-sum-em_(id)_sa · 6.4mm · 6.40mm/px · 6 of 64 frames shown]
[frame 6/64]
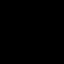
[frame 16/64]
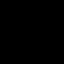
[frame 27/64]
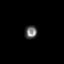
[frame 38/64]
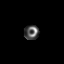
[frame 48/64]
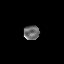
[frame 59/64]
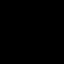

[18 of 18 positions shown; findings below may reference images not displayed]

Canned report from images found in remote index.

Refer to host system for actual result text.

## 2017-12-10 IMAGING — DX DG CHEST 2V
2 series · 2 of 2 positions shown · non-contrast
Comparison: March 22, 2017.

CLINICAL DATA: Fever

EXAM:
CHEST  2 VIEW

[chest lat]
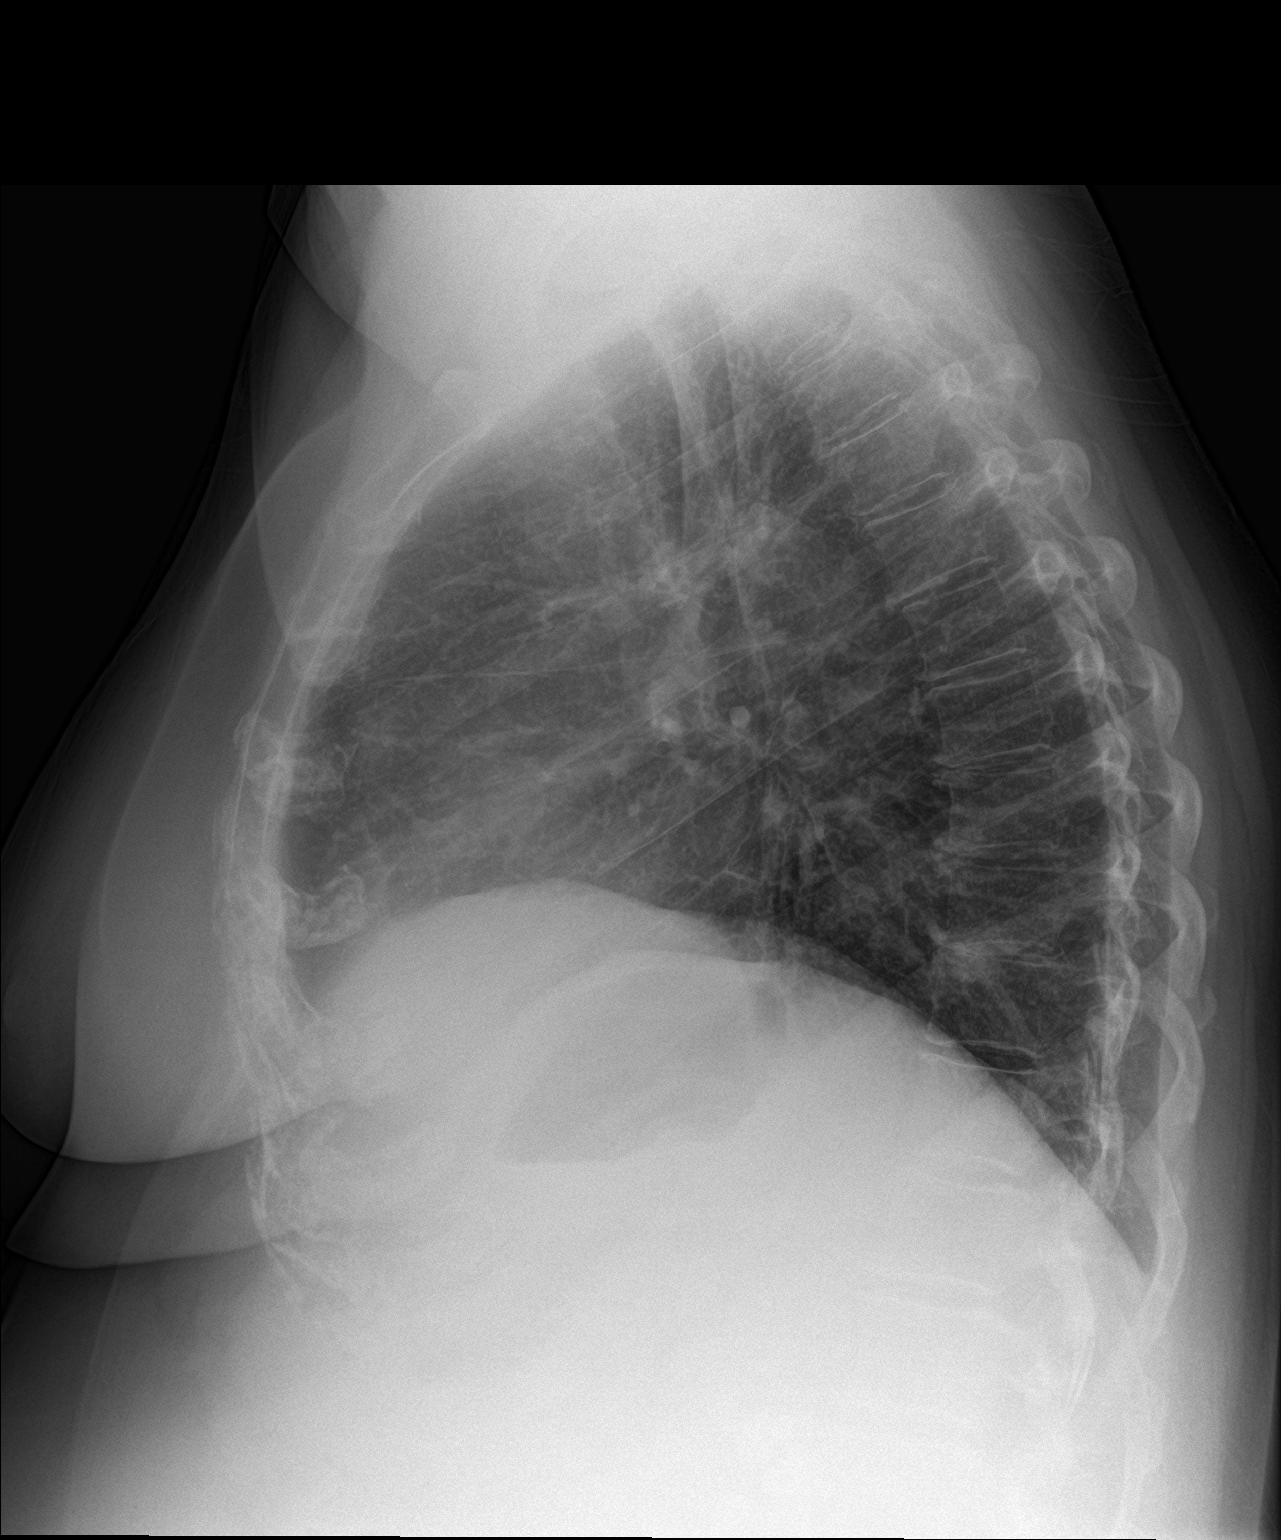

[chest pa]
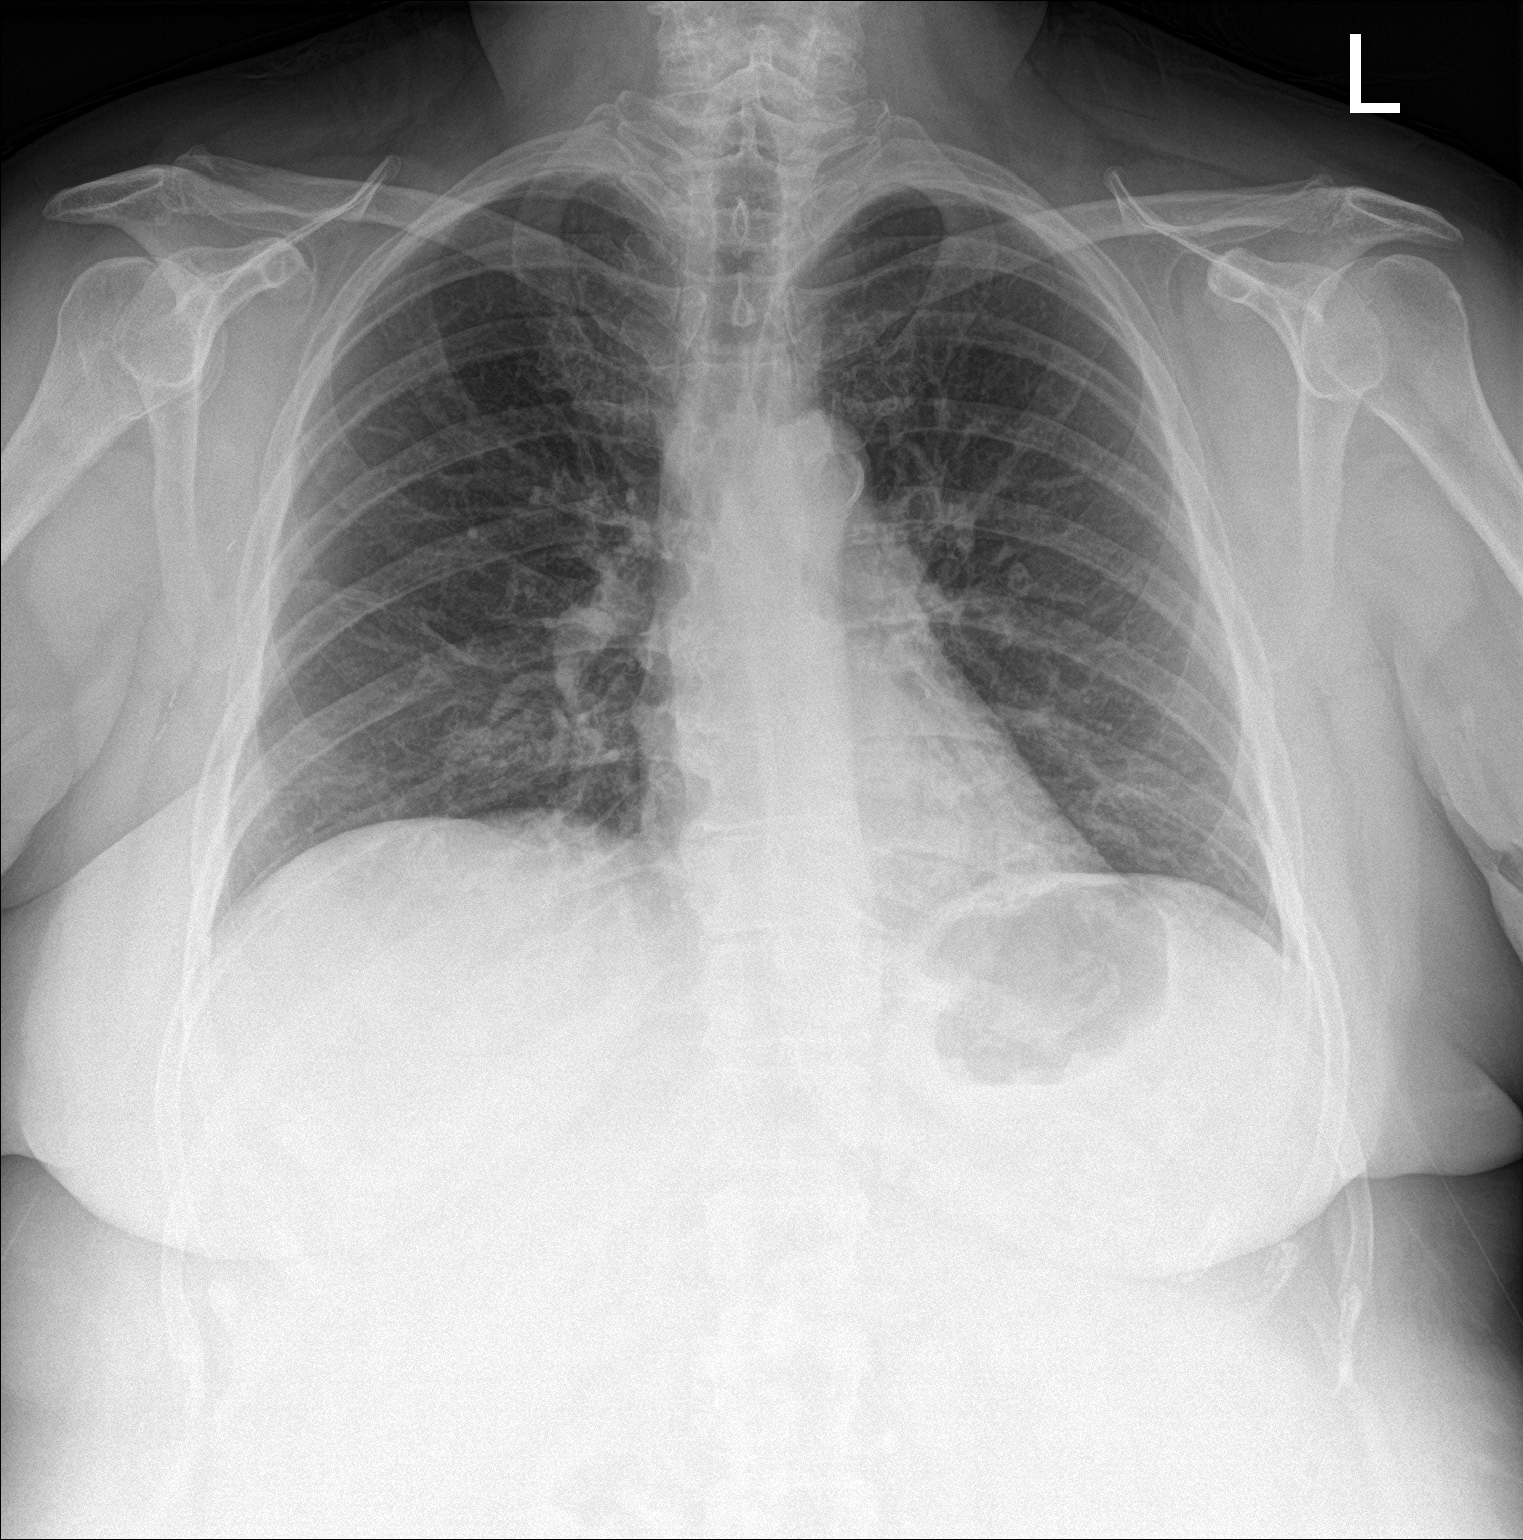

[2 of 2 positions shown; findings below may reference images not displayed]

FINDINGS: There has been clearing of most of the infiltrate seen recently in
the right lower lobe. Slight atelectatic change remains in the right
lower lobe. Lungs elsewhere are clear. Heart size and pulmonary
vascularity are normal. No adenopathy. There is aortic
atherosclerosis. There is degenerative change in the lower thoracic
region. There are surgical clips in the right axillary region.
IMPRESSION: Most of the infiltrate on the right is clear with mild atelectasis
remaining in the right lower lobe. No new opacity. Staples cardiac
silhouette. There is aortic atherosclerosis.

## 2018-02-06 DIAGNOSIS — E039 Hypothyroidism, unspecified: Secondary | ICD-10-CM | POA: Diagnosis not present

## 2018-02-06 DIAGNOSIS — R350 Frequency of micturition: Secondary | ICD-10-CM | POA: Diagnosis not present

## 2018-02-06 DIAGNOSIS — N342 Other urethritis: Secondary | ICD-10-CM | POA: Diagnosis not present

## 2018-06-25 DIAGNOSIS — E890 Postprocedural hypothyroidism: Secondary | ICD-10-CM | POA: Diagnosis not present

## 2018-06-25 DIAGNOSIS — Z86010 Personal history of colonic polyps: Secondary | ICD-10-CM | POA: Diagnosis not present

## 2018-06-25 DIAGNOSIS — Z8571 Personal history of Hodgkin lymphoma: Secondary | ICD-10-CM | POA: Diagnosis not present

## 2018-06-25 DIAGNOSIS — Z1389 Encounter for screening for other disorder: Secondary | ICD-10-CM | POA: Diagnosis not present

## 2018-06-25 DIAGNOSIS — E782 Mixed hyperlipidemia: Secondary | ICD-10-CM | POA: Diagnosis not present

## 2018-07-01 ENCOUNTER — Other Ambulatory Visit: Payer: Self-pay

## 2018-07-01 ENCOUNTER — Emergency Department (HOSPITAL_COMMUNITY): Payer: 59

## 2018-07-01 ENCOUNTER — Encounter (HOSPITAL_COMMUNITY): Payer: Self-pay | Admitting: Emergency Medicine

## 2018-07-01 ENCOUNTER — Emergency Department (HOSPITAL_COMMUNITY)
Admission: EM | Admit: 2018-07-01 | Discharge: 2018-07-02 | Disposition: A | Discharge status: Home / Self Care | Payer: 59 | Attending: Emergency Medicine | Admitting: Emergency Medicine

## 2018-07-01 DIAGNOSIS — Z87891 Personal history of nicotine dependence: Secondary | ICD-10-CM | POA: Insufficient documentation

## 2018-07-01 DIAGNOSIS — Z7982 Long term (current) use of aspirin: Secondary | ICD-10-CM | POA: Insufficient documentation

## 2018-07-01 DIAGNOSIS — R05 Cough: Secondary | ICD-10-CM | POA: Diagnosis not present

## 2018-07-01 DIAGNOSIS — R0602 Shortness of breath: Secondary | ICD-10-CM | POA: Diagnosis not present

## 2018-07-01 DIAGNOSIS — J45909 Unspecified asthma, uncomplicated: Secondary | ICD-10-CM | POA: Insufficient documentation

## 2018-07-01 DIAGNOSIS — R079 Chest pain, unspecified: Secondary | ICD-10-CM | POA: Diagnosis not present

## 2018-07-01 DIAGNOSIS — Z7902 Long term (current) use of antithrombotics/antiplatelets: Secondary | ICD-10-CM | POA: Diagnosis not present

## 2018-07-01 DIAGNOSIS — J189 Pneumonia, unspecified organism: Secondary | ICD-10-CM | POA: Diagnosis not present

## 2018-07-01 DIAGNOSIS — J181 Lobar pneumonia, unspecified organism: Secondary | ICD-10-CM | POA: Insufficient documentation

## 2018-07-01 DIAGNOSIS — E039 Hypothyroidism, unspecified: Secondary | ICD-10-CM | POA: Insufficient documentation

## 2018-07-01 DIAGNOSIS — R042 Hemoptysis: Secondary | ICD-10-CM

## 2018-07-01 DIAGNOSIS — I10 Essential (primary) hypertension: Secondary | ICD-10-CM | POA: Diagnosis not present

## 2018-07-01 DIAGNOSIS — Z79899 Other long term (current) drug therapy: Secondary | ICD-10-CM | POA: Insufficient documentation

## 2018-07-01 DIAGNOSIS — Z96652 Presence of left artificial knee joint: Secondary | ICD-10-CM | POA: Diagnosis not present

## 2018-07-01 NOTE — ED Triage Notes (Signed)
Patient complaining of coughing up blood x 2 since 0200 today.

## 2018-07-02 ENCOUNTER — Emergency Department (HOSPITAL_COMMUNITY): Payer: 59

## 2018-07-02 DIAGNOSIS — R05 Cough: Secondary | ICD-10-CM | POA: Diagnosis not present

## 2018-07-02 LAB — BASIC METABOLIC PANEL
Anion gap: 10 (ref 5–15)
BUN: 17 mg/dL (ref 6–20)
CALCIUM: 9.7 mg/dL (ref 8.9–10.3)
CO2: 25 mmol/L (ref 22–32)
Chloride: 104 mmol/L (ref 98–111)
Creatinine, Ser: 0.75 mg/dL (ref 0.44–1.00)
GFR calc Af Amer: >60 mL/min (ref >60)
GLUCOSE: 130 mg/dL — AB (ref 70–99)
POTASSIUM: 3.9 mmol/L (ref 3.5–5.1)
Sodium: 139 mmol/L (ref 135–145)

## 2018-07-02 LAB — CBC WITH DIFFERENTIAL/PLATELET
Basophils Absolute: 0 10*3/uL (ref 0.0–0.1)
Basophils Relative: 0 %
Eosinophils Absolute: 0.1 10*3/uL (ref 0.0–0.7)
Eosinophils Relative: 1 %
HCT: 40.5 % (ref 36.0–46.0)
Hemoglobin: 13.6 g/dL (ref 12.0–15.0)
LYMPHS ABS: 2.3 10*3/uL (ref 0.7–4.0)
LYMPHS PCT: 18 %
MCH: 32.9 pg (ref 26.0–34.0)
MCHC: 33.6 g/dL (ref 30.0–36.0)
MCV: 97.8 fL (ref 78.0–100.0)
MONO ABS: 0.8 10*3/uL (ref 0.1–1.0)
Monocytes Relative: 6 %
Neutro Abs: 9.4 10*3/uL — ABNORMAL HIGH (ref 1.7–7.7)
Neutrophils Relative %: 75 %
Platelets: 295 10*3/uL (ref 150–400)
RBC: 4.14 MIL/uL (ref 3.87–5.11)
RDW: 13.9 % (ref 11.5–15.5)
WBC: 12.6 10*3/uL — ABNORMAL HIGH (ref 4.0–10.5)

## 2018-07-02 LAB — TROPONIN I: Troponin I: <0.03 ng/mL (ref <0.03)

## 2018-07-02 LAB — D-DIMER, QUANTITATIVE: D-Dimer, Quant: 1.12 ug/mL-FEU — ABNORMAL HIGH (ref 0.00–0.50)

## 2018-07-02 LAB — BRAIN NATRIURETIC PEPTIDE: B Natriuretic Peptide: 22 pg/mL (ref 0.0–100.0)

## 2018-07-02 MED ORDER — IOPAMIDOL (ISOVUE-370) INJECTION 76%
100.0000 mL | Freq: Once | INTRAVENOUS | Status: AC | PRN
  Administered 2018-07-02: 100 mL via INTRAVENOUS

## 2018-07-02 MED ORDER — DOXYCYCLINE HYCLATE 100 MG PO CAPS: 100.0000 mg | ORAL_CAPSULE | Freq: Two times a day (BID) | ORAL | 0 refills | Status: DC

## 2018-07-02 MED ORDER — DOXYCYCLINE HYCLATE 100 MG PO TABS
100.0000 mg | ORAL_TABLET | Freq: Once | ORAL | Status: AC
  Administered 2018-07-02: 100 mg via ORAL
  Filled 2018-07-02: qty 1

## 2018-07-02 MED ORDER — AMOXICILLIN-POT CLAVULANATE 875-125 MG PO TABS: 1.0000 | ORAL_TABLET | Freq: Two times a day (BID) | ORAL | 0 refills | Status: DC

## 2018-07-02 MED ORDER — ONDANSETRON HCL 4 MG/2ML IJ SOLN
4.0000 mg | Freq: Once | INTRAMUSCULAR | Status: AC
  Administered 2018-07-02: 4 mg via INTRAVENOUS
  Filled 2018-07-02: qty 2

## 2018-07-02 MED ORDER — HYDROCORTISONE NA SUCCINATE PF 100 MG IJ SOLR: 100.0000 mg | Freq: Once | INTRAMUSCULAR | Status: DC

## 2018-07-02 MED ORDER — AMOXICILLIN-POT CLAVULANATE 875-125 MG PO TABS
1.0000 | ORAL_TABLET | Freq: Once | ORAL | Status: AC
  Administered 2018-07-02: 1 via ORAL
  Filled 2018-07-02: qty 1

## 2018-07-02 NOTE — Discharge Instructions (Addendum)
Your coughing up blood is likely from pneumonia.  Take the antibiotics as prescribed and follow-up with your doctor.  She had a repeat chest x-ray in 3 to 4 weeks to make sure this is resolving.  Return to the ED with chest pain, shortness of breath, worsening coughing up blood or any other concerns.

## 2018-07-02 NOTE — ED Notes (Signed)
Pt ambulated around nurse's station with oxygen saturation of 100% without difficulty ambulating or dizziness.

## 2018-07-02 NOTE — ED Provider Notes (Signed)
[facility] EMERGENCY DEPARTMENT Provider Note   CSN: [id number] Arrival date & time: 07/01/18  2219     History   Chief Complaint Chief Complaint  Patient presents with  . Hemoptysis    HPI [name] is a 57 y.o. female.  Former smoker with history of asthma presenting with 2 episodes of hemoptysis within the past 24 hours.  Reports around 2 AM yesterday she had a coughing spell and coughed up several spots of blood mixed with clear mucus.  This happened again tonight when she was trying to go to bed.  She did well throughout the day.  She denies any shortness of breath, chest pain or fever.  He does not take any blood thinners.  She states she is on no chronic medications for her asthma.  No abdominal pain, nausea or vomiting.  No chest pain.  She is very anxious.  The history is provided by the patient.    Past Medical History:  Diagnosis Date  . Anxiety   . Cancer ([place])    hodgkins lymphoma, 1992 Diagnosed  . H/O radioactive iodine thyroid ablation 1980's  . Heart murmur   . History of kidney stones   . Hypertension   . Hypothyroidism    "thyroid is dead"  . Primary localized osteoarthritis of left knee   . S/P total knee replacement using cement, left 03/19/2017    Patient Active Problem List   Diagnosis Date Noted  . Perforation of sigmoid colon ([place]) 08/02/2017  . Pneumonitis 03/30/2017  . Fever   . Debility 03/28/2017  . FUO (fever of unknown origin)   . Leukocytosis   . Hypokalemia   . Acute blood loss anemia   . Benign essential HTN   . History of total left knee replacement   . Perforated bowel ([place]) 03/23/2017  . S/P total knee replacement using cement, left 03/12/2017 by Dr Noemi Chapel 03/19/2017  . Perforated diverticulum   . History of lymphoma   . Hyperglycemia   . Post-op pain   . Perforation of sigmoid colon due to diverticulitis 03/17/2017  . Primary localized osteoarthritis of left knee   . Hypothyroidism   . Hypertension   . Cancer ([place])     . OSTEOARTHRITIS, KNEE, RIGHT 03/30/2010    Past Surgical History:  Procedure Laterality Date  . AXILLARY LYMPH NODE DISSECTION Right 1992  . BONE MARROW TRANSPLANT    . COLON RESECTION SIGMOID N/A 03/17/2017   Procedure: COLON RESECTION SIGMOID AND CREATION OF COLOSTOMY;  Surgeon: Donnie Mesa, MD;  Location: [place];  Service: General;  Laterality: N/A;  . COLONOSCOPY N/A 09/07/2015   Procedure: COLONOSCOPY;  Surgeon: Aviva Signs, MD;  [facility];  Service: Gastroenterology;  Laterality: N/A;  . COLOSTOMY REVERSAL N/A 08/02/2017   Procedure: COLOSTOMY REVERSAL;  Surgeon: Donnie Mesa, MD;  Location: [place];  Service: General;  Laterality: N/A;  . [place]  . TONSILLECTOMY  1965  . TOTAL KNEE ARTHROPLASTY Left 03/12/2017   Procedure: LEFT TOTAL KNEE ARTHROPLASTY;  Surgeon: Elsie Saas, MD;  Location: [place];  Service: Orthopedics;  Laterality: Left;     OB History   None      Home Medications    Prior to Admission medications   Medication Sig Start Date End Date Taking? Authorizing Provider  acetaminophen (TYLENOL) 325 MG tablet Take 1-2 tablets (325-650 mg total) by mouth every 6 (six) hours as needed for mild pain. 03/30/17   Love, Ivan Anchors,  PA-C  albuterol (PROVENTIL HFA;VENTOLIN HFA) 108 (90 Base) MCG/ACT inhaler Inhale 2 puffs into the lungs every 4 (four) hours as needed for wheezing or shortness of breath. 07/22/17   Fransico Meadow, PA-C  amLODipine (NORVASC) 10 MG tablet Take 1 tablet (10 mg total) by mouth every evening. 03/30/17   Love, Ivan Anchors, PA-C  aspirin EC 81 MG tablet Take 81 mg by mouth daily.    [provider]  benazepril (LOTENSIN) 20 MG tablet Take 1 tablet (20 mg total) by mouth daily. Patient not taking: Reported on 07/19/2017 03/30/17   Love, Ivan Anchors, PA-C  benazepril-hydrochlorthiazide (LOTENSIN HCT) 20-25 MG tablet Take 1 tablet by mouth daily. 06/05/17   [provider]  cetirizine (ZYRTEC) 10 MG tablet  Take 10 mg by mouth daily.    [provider]  diazepam (VALIUM) 2 MG tablet Take 2 mg by mouth as needed. 05/21/17   [provider]  diphenhydrAMINE (BENADRYL) 25 mg capsule Take 25 mg by mouth at bedtime.    [provider]  iron polysaccharides (NIFEREX) 150 MG capsule Take 1 capsule (150 mg total) by mouth 2 (two) times daily before lunch and supper. Patient not taking: Reported on 07/19/2017 03/30/17   Love, Ivan Anchors, PA-C  levothyroxine (SYNTHROID, LEVOTHROID) 88 MCG tablet Take 88 mcg by mouth daily before breakfast.    [provider]  methocarbamol (ROBAXIN) 500 MG tablet Take 0.5-1 tablets (250-500 mg total) by mouth every 8 (eight) hours as needed for muscle spasms. Patient not taking: Reported on 07/19/2017 03/30/17   Love, Ivan Anchors, PA-C  multivitamin (PROSIGHT) TABS tablet Take 1 tablet by mouth daily. 03/31/17   Love, Ivan Anchors, PA-C  ondansetron (ZOFRAN-ODT) 4 MG disintegrating tablet Take 1 tablet (4 mg total) by mouth every 6 (six) hours as needed for nausea. Patient not taking: Reported on 07/19/2017 03/30/17   Love, Ivan Anchors, PA-C  oxyCODONE (OXY IR/ROXICODONE) 5 MG immediate release tablet Take 1 tablet (5 mg total) by mouth every 4 (four) hours as needed for moderate pain. 08/06/17   Donnie Mesa, MD  pravastatin (PRAVACHOL) 20 MG tablet Take 1 tablet (20 mg total) by mouth every evening. 03/30/17   Love, Ivan Anchors, PA-C  Probiotic Product (PHILLIPS COLON HEALTH PO) Take 1 tablet by mouth daily.    [provider]    Family History Family History  Problem Relation Age of Onset  . Breast cancer Mother   . Diabetes Father     Social History Social History   Tobacco Use  . Smoking status: Former Smoker    Last attempt to quit: 05/08/1990    Years since quitting: 28.1  . Smokeless tobacco: Never Used  Substance Use Topics  . Alcohol use: No  . Drug use: No     Allergies   Cephalexin; Contrast media [iodinated diagnostic agents];  and Iodine-131   Review of Systems Review of Systems  Constitutional: Positive for appetite change. Negative for fatigue and fever.  HENT: Negative for congestion and rhinorrhea.   Respiratory: Positive for cough. Negative for chest tightness and shortness of breath.   Cardiovascular: Positive for chest pain.  Gastrointestinal: Negative for abdominal pain, nausea and vomiting.  Genitourinary: Negative for dysuria and hematuria.  Musculoskeletal: Negative for arthralgias, back pain and myalgias.  Neurological: Negative for dizziness, weakness and headaches.    all other systems are negative except as noted in the HPI and PMH.    Physical Exam Updated Vital Signs BP 139/60  Pulse (!) 103   Temp 98.8 F (37.1 C)   Resp 17   Ht 5\' 3"  (1.6 m)   Wt 107 kg   SpO2 97%   BMI 41.81 kg/m   Physical Exam  Constitutional: She is oriented to person, place, and time. She appears well-developed and well-nourished. No distress.  anxious  HENT:  Head: Normocephalic and atraumatic.  Mouth/Throat: Oropharynx is clear and moist. No oropharyngeal exudate.  Eyes: Pupils are equal, round, and reactive to light. Conjunctivae and EOM are normal.  Neck: Normal range of motion. Neck supple.  No meningismus.  Cardiovascular: Normal rate, normal heart sounds and intact distal pulses.  No murmur heard. tachycardic  Pulmonary/Chest: Effort normal and breath sounds normal. No respiratory distress. She has no wheezes. She exhibits no tenderness.  Abdominal: Soft. There is no tenderness. There is no rebound and no guarding.  Musculoskeletal: Normal range of motion. She exhibits no edema or tenderness.  Neurological: She is alert and oriented to person, place, and time. No cranial nerve deficit. She exhibits normal muscle tone. Coordination normal.  No ataxia on finger to nose bilaterally. No pronator drift. 5/5 strength throughout. CN 2-12 intact.Equal grip strength. Sensation intact.   Skin: Skin is  warm.  Psychiatric: She has a normal mood and affect. Her behavior is normal.  Nursing note and vitals reviewed.    ED Treatments / Results  Labs (all labs ordered are listed, but only abnormal results are displayed) Labs Reviewed  CBC WITH DIFFERENTIAL/PLATELET - Abnormal; Notable for the following components:      Result Value   WBC 12.6 (*)    Neutro Abs 9.4 (*)    All other components within normal limits  BASIC METABOLIC PANEL - Abnormal; Notable for the following components:   Glucose, Bld 130 (*)    All other components within normal limits  D-DIMER, QUANTITATIVE (NOT AT [facility]) - Abnormal; Notable for the following components:   D-Dimer, Quant 1.12 (*)    All other components within normal limits  CULTURE, BLOOD (ROUTINE X 2)  CULTURE, BLOOD (ROUTINE X 2)  TROPONIN I  BRAIN NATRIURETIC PEPTIDE    EKG None  Radiology Dg Chest 2 View  Result Date: 07/01/2018 CLINICAL DATA:  Cough. EXAM: CHEST - 2 VIEW COMPARISON:  July 22, 2017 FINDINGS: Right middle lobe opacity. The heart, hila, mediastinum, lungs, and pleura are otherwise unremarkable. IMPRESSION: Right middle lobe infiltrate. Recommend follow-up to resolution. No other acute abnormalities. Electronically Signed   By: Dorise Bullion III M.D   On: 07/01/2018 23:28   Ct Angio Chest Pe W And/or Wo Contrast  Result Date: 07/02/2018 CLINICAL DATA:  57 year old female with cough. Concern for pulmonary embolism. EXAM: CT ANGIOGRAPHY CHEST WITH CONTRAST TECHNIQUE: Multidetector CT imaging of the chest was performed using the standard protocol during bolus administration of intravenous contrast. Multiplanar CT image reconstructions and MIPs were obtained to evaluate the vascular anatomy. CONTRAST:  166mL ISOVUE-370 IOPAMIDOL (ISOVUE-370) INJECTION 76% COMPARISON:  Chest radiograph dated 07/01/2018 FINDINGS: Cardiovascular: There is no cardiomegaly or pericardial effusion. Mild atherosclerotic calcification of the aortic arch.  The origins of the great vessels of the aortic arch are patent. No CT evidence of pulmonary embolism. Mediastinum/Nodes: There is no hilar adenopathy. Top-normal subcarinal lymph node measuring 11 mm in short axis. No mediastinal fluid collection. The esophagus is grossly unremarkable. Lungs/Pleura: There is a patchy area of consolidative change in the right middle lobe. The left lung is clear. There is no pleural effusion or  pneumothorax. The central airways are patent. Upper Abdomen: There is diffuse fatty infiltration of the liver. The visualized upper abdomen is otherwise unremarkable. Musculoskeletal: No chest wall abnormality. No acute or significant osseous findings. Review of the MIP images confirms the above findings. IMPRESSION: 1. No CT evidence of pulmonary embolism. 2. Right middle lobe consolidation most consistent with pneumonia. Clinical correlation and follow-up to resolution is recommended. Electronically Signed   By: Anner Crete M.D.   On: 07/02/2018 03:12    Procedures Procedures (including critical care time)  Medications Ordered in ED Medications - No data to display   Initial Impression / Assessment and Plan / ED Course  I have reviewed the triage vital signs and the nursing notes.  Pertinent labs & imaging results that were available during my care of the patient were reviewed by me and considered in my medical decision making (see chart for details).    2 episodes of hemoptysis since yesterday.  Denies chest pain or shortness of breath.  She appears well in no distress.  Oropharynx is clear.  Her vitals are stable.  Chest x-ray shows likely right middle lobe pneumonia.  This is likely source of her hemoptysis. Hemoglobin is stable.  D-dimer positive.  Patient has listed contrast allergy to IV contrast.  However she states her allergy is nausea and vomiting.  No difficulty breathing or swallowing or lip or tongue swelling.  She will be premedicated with Zofran.   Tolerated CT well.  There is no pulmonary embolism.  CT confirms right middle lobe pneumonia. She is ambulatory without hypoxia.  She will be given antibiotics and PCP follow-up.  Advised to have repeat chest x-ray in 1 month to assure clearing.  Return precautions discussed. Final Clinical Impressions(s) / ED Diagnoses   Final diagnoses:  Community acquired pneumonia of right middle lobe of lung ([place])  Hemoptysis    ED Discharge Orders         Ordered    amoxicillin-clavulanate (AUGMENTIN) 875-125 MG tablet  Every 12 hours     07/02/18 0416    doxycycline (VIBRAMYCIN) 100 MG capsule  2 times daily     07/02/18 0416           Ernestine Rohman, Annie Main, MD 07/02/18 [phone]

## 2018-07-07 LAB — CULTURE, BLOOD (ROUTINE X 2)
CULTURE: NO GROWTH
Culture: NO GROWTH
Special Requests: ADEQUATE
Special Requests: ADEQUATE

## 2018-10-25 DIAGNOSIS — C8100 Nodular lymphocyte predominant Hodgkin lymphoma, unspecified site: Secondary | ICD-10-CM | POA: Diagnosis not present

## 2018-10-25 DIAGNOSIS — Z9221 Personal history of antineoplastic chemotherapy: Secondary | ICD-10-CM | POA: Diagnosis not present

## 2018-10-25 DIAGNOSIS — Z8571 Personal history of Hodgkin lymphoma: Secondary | ICD-10-CM | POA: Diagnosis not present

## 2018-10-25 DIAGNOSIS — Z9484 Stem cells transplant status: Secondary | ICD-10-CM | POA: Diagnosis not present

## 2018-10-25 DIAGNOSIS — Z08 Encounter for follow-up examination after completed treatment for malignant neoplasm: Secondary | ICD-10-CM | POA: Diagnosis not present

## 2018-11-05 DIAGNOSIS — M1711 Unilateral primary osteoarthritis, right knee: Secondary | ICD-10-CM | POA: Diagnosis not present

## 2018-11-05 DIAGNOSIS — Z96652 Presence of left artificial knee joint: Secondary | ICD-10-CM | POA: Diagnosis not present

## 2018-11-18 DIAGNOSIS — C8170 Other classical Hodgkin lymphoma, unspecified site: Secondary | ICD-10-CM | POA: Diagnosis not present

## 2018-11-18 DIAGNOSIS — Z1389 Encounter for screening for other disorder: Secondary | ICD-10-CM | POA: Diagnosis not present

## 2018-11-18 DIAGNOSIS — E039 Hypothyroidism, unspecified: Secondary | ICD-10-CM | POA: Diagnosis not present

## 2018-12-31 DIAGNOSIS — Z23 Encounter for immunization: Secondary | ICD-10-CM | POA: Diagnosis not present

## 2019-01-16 DIAGNOSIS — D123 Benign neoplasm of transverse colon: Secondary | ICD-10-CM | POA: Diagnosis not present

## 2019-01-16 DIAGNOSIS — D125 Benign neoplasm of sigmoid colon: Secondary | ICD-10-CM | POA: Diagnosis not present

## 2019-01-16 DIAGNOSIS — D124 Benign neoplasm of descending colon: Secondary | ICD-10-CM | POA: Diagnosis not present

## 2019-01-16 DIAGNOSIS — Z86010 Personal history of colonic polyps: Secondary | ICD-10-CM | POA: Diagnosis not present

## 2019-04-03 ENCOUNTER — Other Ambulatory Visit: Payer: Self-pay

## 2019-04-03 ENCOUNTER — Other Ambulatory Visit (HOSPITAL_COMMUNITY): Payer: Self-pay | Admitting: Family Medicine

## 2019-04-03 ENCOUNTER — Ambulatory Visit (HOSPITAL_COMMUNITY)
Admission: RE | Admit: 2019-04-03 | Discharge: 2019-04-03 | Disposition: A | Discharge status: Home / Self Care | Payer: BC Managed Care – PPO | Source: Ambulatory Visit | Attending: Family Medicine | Admitting: Family Medicine

## 2019-04-03 DIAGNOSIS — M7989 Other specified soft tissue disorders: Secondary | ICD-10-CM

## 2019-04-03 DIAGNOSIS — Z6841 Body Mass Index (BMI) 40.0 and over, adult: Secondary | ICD-10-CM | POA: Diagnosis not present

## 2019-04-03 DIAGNOSIS — A46 Erysipelas: Secondary | ICD-10-CM | POA: Diagnosis not present

## 2019-04-03 DIAGNOSIS — R600 Localized edema: Secondary | ICD-10-CM | POA: Diagnosis not present

## 2019-04-03 DIAGNOSIS — Z1389 Encounter for screening for other disorder: Secondary | ICD-10-CM | POA: Diagnosis not present

## 2019-04-08 DIAGNOSIS — M1711 Unilateral primary osteoarthritis, right knee: Secondary | ICD-10-CM | POA: Diagnosis not present

## 2019-04-08 DIAGNOSIS — Z96652 Presence of left artificial knee joint: Secondary | ICD-10-CM | POA: Diagnosis not present

## 2019-06-24 DIAGNOSIS — M1711 Unilateral primary osteoarthritis, right knee: Secondary | ICD-10-CM | POA: Diagnosis not present

## 2019-06-24 DIAGNOSIS — Z96652 Presence of left artificial knee joint: Secondary | ICD-10-CM | POA: Diagnosis not present

## 2019-07-30 DIAGNOSIS — Z23 Encounter for immunization: Secondary | ICD-10-CM | POA: Diagnosis not present

## 2019-07-30 DIAGNOSIS — Z8571 Personal history of Hodgkin lymphoma: Secondary | ICD-10-CM | POA: Diagnosis not present

## 2019-07-30 DIAGNOSIS — E890 Postprocedural hypothyroidism: Secondary | ICD-10-CM | POA: Diagnosis not present

## 2019-07-30 DIAGNOSIS — Z01810 Encounter for preprocedural cardiovascular examination: Secondary | ICD-10-CM | POA: Diagnosis not present

## 2019-07-30 DIAGNOSIS — R7309 Other abnormal glucose: Secondary | ICD-10-CM | POA: Diagnosis not present

## 2019-07-30 DIAGNOSIS — I10 Essential (primary) hypertension: Secondary | ICD-10-CM | POA: Diagnosis not present

## 2019-07-30 DIAGNOSIS — Z6841 Body Mass Index (BMI) 40.0 and over, adult: Secondary | ICD-10-CM | POA: Diagnosis not present

## 2019-07-30 DIAGNOSIS — K5780 Diverticulitis of intestine, part unspecified, with perforation and abscess without bleeding: Secondary | ICD-10-CM | POA: Diagnosis not present

## 2019-07-30 DIAGNOSIS — E7849 Other hyperlipidemia: Secondary | ICD-10-CM | POA: Diagnosis not present

## 2019-08-12 ENCOUNTER — Other Ambulatory Visit: Payer: Self-pay

## 2019-08-12 DIAGNOSIS — Z20822 Contact with and (suspected) exposure to covid-19: Secondary | ICD-10-CM

## 2019-08-12 DIAGNOSIS — Z20828 Contact with and (suspected) exposure to other viral communicable diseases: Secondary | ICD-10-CM | POA: Diagnosis not present

## 2019-08-14 LAB — NOVEL CORONAVIRUS, NAA: SARS-CoV-2, NAA: NOT DETECTED

## 2019-08-26 DIAGNOSIS — Z111 Encounter for screening for respiratory tuberculosis: Secondary | ICD-10-CM | POA: Diagnosis not present

## 2019-09-02 DIAGNOSIS — M1711 Unilateral primary osteoarthritis, right knee: Secondary | ICD-10-CM | POA: Diagnosis not present

## 2019-09-02 DIAGNOSIS — Z96652 Presence of left artificial knee joint: Secondary | ICD-10-CM | POA: Diagnosis not present

## 2019-09-30 DIAGNOSIS — M1711 Unilateral primary osteoarthritis, right knee: Secondary | ICD-10-CM | POA: Diagnosis not present

## 2019-09-30 DIAGNOSIS — M4726 Other spondylosis with radiculopathy, lumbar region: Secondary | ICD-10-CM | POA: Diagnosis not present

## 2019-12-08 ENCOUNTER — Ambulatory Visit: Admit: 2019-12-08 | Payer: PRIVATE HEALTH INSURANCE | Primary: Internal Medicine

## 2019-12-18 ENCOUNTER — Ambulatory Visit: Admit: 2019-12-18 | Payer: PRIVATE HEALTH INSURANCE | Primary: Internal Medicine

## 2019-12-26 ENCOUNTER — Ambulatory Visit: Admit: 2019-12-26 | Payer: PRIVATE HEALTH INSURANCE | Primary: Internal Medicine

## 2020-01-06 MED ORDER — ALPRAZOLAM 0.5 MG TABLET
0.5 | ORAL_TABLET | Freq: Every evening | ORAL | 2 refills | 25.00000 days | Status: AC | PRN
Start: 2020-01-06 — End: 2020-04-16

## 2020-01-08 DIAGNOSIS — Z23 Encounter for immunization: Secondary | ICD-10-CM | POA: Diagnosis not present

## 2020-01-16 DIAGNOSIS — F419 Anxiety disorder, unspecified: Secondary | ICD-10-CM | POA: Diagnosis not present

## 2020-01-16 DIAGNOSIS — E7849 Other hyperlipidemia: Secondary | ICD-10-CM | POA: Diagnosis not present

## 2020-01-16 DIAGNOSIS — R7309 Other abnormal glucose: Secondary | ICD-10-CM | POA: Diagnosis not present

## 2020-01-16 DIAGNOSIS — E782 Mixed hyperlipidemia: Secondary | ICD-10-CM | POA: Diagnosis not present

## 2020-01-16 DIAGNOSIS — I10 Essential (primary) hypertension: Secondary | ICD-10-CM | POA: Diagnosis not present

## 2020-01-16 DIAGNOSIS — Z6841 Body Mass Index (BMI) 40.0 and over, adult: Secondary | ICD-10-CM | POA: Diagnosis not present

## 2020-01-28 MED ORDER — FISH OIL 340 MG-1,000 MG CAPSULE
340-1000 | ORAL_CAPSULE | Freq: Every day | ORAL | 2 refills | 90.00000 days | Status: AC
Start: 2020-01-28 — End: ?

## 2020-01-28 MED ORDER — ASCORBIC ACID 100 MG-ELDERBERRY FRUIT 50 MG CHEWABLE TABLET
100-50 | ORAL_TABLET | Freq: Two times a day (BID) | ORAL | 12 refills | 90.00000 days | Status: AC
Start: 2020-01-28 — End: 2021-06-17

## 2020-02-04 MED ORDER — FLUTICASONE PROPIONATE 50 MCG/ACTUATION NASAL SPRAY,SUSPENSION
50 | Freq: Every day | NASAL | 13 refills | 30.00000 days | Status: AC
Start: 2020-02-04 — End: 2020-12-09

## 2020-02-07 DIAGNOSIS — Z23 Encounter for immunization: Secondary | ICD-10-CM | POA: Diagnosis not present

## 2020-03-01 MED ORDER — ATORVASTATIN 10 MG TABLET
10 | ORAL_TABLET | ORAL | 2 refills | 90.00000 days | Status: AC
Start: 2020-03-01 — End: 2020-07-12

## 2020-03-09 ENCOUNTER — Inpatient Hospital Stay: Admit: 2020-03-09 | Discharge: 2020-03-09 | Payer: BLUE CROSS/BLUE SHIELD | Primary: Internal Medicine

## 2020-03-09 DIAGNOSIS — R300 Dysuria: Secondary | ICD-10-CM

## 2020-03-09 LAB — URINALYSIS WITH CULTURE REFLEX      (BH LMW YH)
BKR BILIRUBIN, UA: NEGATIVE
BKR BLOOD, UA: NEGATIVE
BKR GLUCOSE, UA: NEGATIVE
BKR KETONES, UA: NEGATIVE
BKR LEUKOCYTE ESTERASE, UA: POSITIVE — AB
BKR NITRITE, UA: NEGATIVE
BKR PH, UA: 7 (ref 5.5–7.5)
BKR PROTEIN, UA: NEGATIVE
BKR SPECIFIC GRAVITY, UA: 1.009 (ref 1.005–1.030)
BKR UROBILINOGEN, UA: <2 EU/dL (ref <=2.0)

## 2020-03-09 LAB — URINE MICROSCOPIC     (BH GH LMW YH)
BKR HYALINE CASTS, UA INSTRUMENT (NUMERIC): 0 /LPF (ref 0–3)
BKR RBC/HPF INSTRUMENT: 0 /HPF (ref 0–2)
BKR WBC/HPF INSTRUMENT: 11 /HPF — ABNORMAL HIGH (ref 0–5)

## 2020-03-09 LAB — UA REFLEX CULTURE

## 2020-03-10 LAB — URINE CULTURE: BKR URINE CULTURE, ROUTINE: 7000 — AB

## 2020-03-10 MED ORDER — NITROFURANTOIN MONOHYDRATE/MACROCRYSTALS 100 MG CAPSULE
100 | ORAL_CAPSULE | Freq: Two times a day (BID) | ORAL | 1 refills | 5.00000 days | Status: AC
Start: 2020-03-10 — End: ?

## 2020-04-05 MED ORDER — CODEINE 10 MG-GUAIFENESIN 100 MG/5 ML ORAL LIQUID
10-100 | ORAL | 2 refills | 8.00000 days | Status: AC
Start: 2020-04-05 — End: 2020-04-16

## 2020-04-12 MED ORDER — AMOXICILLIN 875 MG TABLET
875 | ORAL_TABLET | Freq: Two times a day (BID) | ORAL | 1 refills | 10.00000 days | Status: AC
Start: 2020-04-12 — End: ?

## 2020-04-16 MED ORDER — ALPRAZOLAM 0.5 MG TABLET
0.5 | ORAL_TABLET | Freq: Every evening | ORAL | 2 refills | 25.00000 days | Status: AC | PRN
Start: 2020-04-16 — End: 2020-10-08

## 2020-05-25 MED ORDER — AMOXICILLIN 875 MG TABLET
875 | ORAL_TABLET | Freq: Two times a day (BID) | ORAL | 1 refills | 10.00000 days | Status: AC
Start: 2020-05-25 — End: ?

## 2020-06-02 DIAGNOSIS — Z111 Encounter for screening for respiratory tuberculosis: Secondary | ICD-10-CM | POA: Diagnosis not present

## 2020-06-09 ENCOUNTER — Inpatient Hospital Stay: Admit: 2020-06-09 | Discharge: 2020-06-09 | Payer: BLUE CROSS/BLUE SHIELD | Primary: Internal Medicine

## 2020-06-09 DIAGNOSIS — Z0000 Encounter for general adult medical examination without abnormal findings: Secondary | ICD-10-CM

## 2020-06-09 LAB — URINALYSIS WITH CULTURE REFLEX      (BH LMW YH)
BKR BACTERIA, UA: NEGATIVE /HPF — AB (ref 8.8–10.2)
BKR BILIRUBIN, UA: NEGATIVE
BKR BLOOD, UA: NEGATIVE % (ref 3.0–11.0)
BKR GLUCOSE, UA: NEGATIVE
BKR KETONES, UA: NEGATIVE
BKR LEUKOCYTE ESTERASE, UA: POSITIVE — AB
BKR NITRITE, UA: NEGATIVE x 1000/??L (ref 0.0–0.4)
BKR PH, UA: 7 (ref 5.5–7.5)
BKR PROTEIN, UA: NEGATIVE
BKR SPECIFIC GRAVITY, UA: 1.005 mg/dL (ref 1.005–1.030)
BKR UROBILINOGEN, UA: <2 EU/dL (ref <=2.0)

## 2020-06-09 LAB — COMPREHENSIVE METABOLIC PANEL
BKR A/G RATIO: 1.6 (ref 1.0–2.2)
BKR ALANINE AMINOTRANSFERASE (ALT): 29 U/L (ref 10–35)
BKR ALBUMIN: 4.6 g/dL (ref 3.6–4.9)
BKR ALKALINE PHOSPHATASE: 74 U/L — AB (ref 9–122)
BKR ANION GAP: 12 (ref 7–17)
BKR ASPARTATE AMINOTRANSFERASE (AST): 21 U/L (ref 10–35)
BKR AST/ALT RATIO: 0.7
BKR BILIRUBIN TOTAL: 0.8 mg/dL (ref <=1.2)
BKR BLOOD UREA NITROGEN: 15 mg/dL (ref 8–23)
BKR BUN / CREAT RATIO: 23.1 /HPF — ABNORMAL HIGH (ref 8.0–23.0)
BKR CALCIUM: 9.7 mg/dL (ref 8.8–10.2)
BKR CHLORIDE: 103 mmol/L (ref 98–107)
BKR CO2: 26 mmol/L — ABNORMAL LOW (ref 20–30)
BKR CREATININE: 0.65 mg/dL (ref 0.40–1.30)
BKR EGFR (AFR AMER): >60 mL/min/{1.73_m2} (ref >60)
BKR EGFR (NON AFRICAN AMERICAN): >60 mL/min/{1.73_m2} (ref >60)
BKR GLOBULIN: 2.9 g/dL
BKR GLUCOSE: 99 mg/dL (ref 70–100)
BKR POTASSIUM: 4.2 mmol/L (ref 3.3–5.1)
BKR PROTEIN TOTAL: 7.5 g/dL (ref 6.6–8.7)
BKR SODIUM: 141 mmol/L (ref 136–144)

## 2020-06-09 LAB — CBC WITH AUTO DIFFERENTIAL
BKR WAM ABSOLUTE IMMATURE GRANULOCYTES: 0 x 1000/??L (ref 0.0–0.4)
BKR WAM ABSOLUTE LYMPHOCYTE COUNT: 1.5 x 1000/??L (ref 0.5–5.4)
BKR WAM ABSOLUTE NRBC: 0 x 1000/ÂµL
BKR WAM ANALYZER ANC: 2.6 x 1000/ÂµL (ref 2.2–7.2)
BKR WAM BASOPHIL ABSOLUTE COUNT: 0 x 1000/??L (ref 0.0–0.2)
BKR WAM BASOPHILS: 0.9 % (ref 0.0–2.0)
BKR WAM EOSINOPHIL ABSOLUTE COUNT: 0.2 x 1000/ÂµL (ref 0.0–0.4)
BKR WAM EOSINOPHILS: 3.6 % (ref 0.0–4.0)
BKR WAM HEMATOCRIT: 43.5 % (ref 36.0–48.0)
BKR WAM HEMOGLOBIN: 13.7 g/dL (ref 12.0–15.0)
BKR WAM IMMATURE GRANULOCYTES: 0.2 % (ref 0.0–0.4)
BKR WAM LYMPHOCYTES: 31.3 % (ref 10.0–50.0)
BKR WAM MCHC: 31.5 g/dL — ABNORMAL LOW (ref 33.0–37.0)
BKR WAM MCV: 89.3 fL (ref 81.0–99.0)
BKR WAM MONOCYTE ABSOLUTE COUNT: 0.5 x 1000/ÂµL (ref 0.1–1.2)
BKR WAM MONOCYTES: 9.8 % (ref 3.0–11.0)
BKR WAM MPV: 11.1 fL (ref 8.0–12.0)
BKR WAM NEUTROPHILS: 54.2 % (ref 45.0–90.0)
BKR WAM NUCLEATED RED BLOOD CELLS: 0 % (ref 0.0–0.0)
BKR WAM PLATELETS: 296 x1000/??L (ref 120–450)
BKR WAM RDW-CV: 13 % (ref 11.5–14.5)
BKR WAM RED BLOOD CELL COUNT: 4.9 M/??L (ref 3.5–5.5)
BKR WAM WHITE BLOOD CELL COUNT: 4.7 x1000/??L — ABNORMAL LOW (ref 4.8–10.8)

## 2020-06-09 LAB — HEMOGLOBIN A1C
BKR ESTIMATED AVERAGE GLUCOSE: 123 mg/dL
BKR HEMOGLOBIN A1C: 5.9 % — ABNORMAL HIGH (ref 4.0–5.6)

## 2020-06-09 LAB — LIPID PANEL
BKR CHOLESTEROL/HDL RATIO: 4 (ref 0.0–5.0)
BKR CHOLESTEROL: 197 mg/dL
BKR HDL CHOLESTEROL: 49 mg/dL (ref >=40)
BKR LDL CHOLESTEROL CALCULATED: 117 mg/dL — ABNORMAL HIGH
BKR TRIGLYCERIDES: 154 mg/dL — ABNORMAL HIGH

## 2020-06-09 LAB — URINE MICROSCOPIC     (BH GH LMW YH)
BKR HYALINE CASTS, UA INSTRUMENT (NUMERIC): 0 /LPF (ref 0–3)
BKR RBC/HPF INSTRUMENT: 1 /HPF (ref 0–2)
BKR WBC/HPF INSTRUMENT: 1 /HPF (ref 0–5)

## 2020-06-09 LAB — UA REFLEX CULTURE

## 2020-06-09 LAB — TSH W/REFLEX TO FT4     (BH GH LMW Q YH): BKR THYROID STIMULATING HORMONE: 2.2 u[IU]/mL

## 2020-06-12 LAB — URINE CULTURE

## 2020-06-14 DIAGNOSIS — J302 Other seasonal allergic rhinitis: Secondary | ICD-10-CM | POA: Diagnosis not present

## 2020-06-14 DIAGNOSIS — J069 Acute upper respiratory infection, unspecified: Secondary | ICD-10-CM | POA: Diagnosis not present

## 2020-07-06 DIAGNOSIS — Z6839 Body mass index (BMI) 39.0-39.9, adult: Secondary | ICD-10-CM | POA: Diagnosis not present

## 2020-07-06 DIAGNOSIS — E782 Mixed hyperlipidemia: Secondary | ICD-10-CM | POA: Diagnosis not present

## 2020-07-06 DIAGNOSIS — I10 Essential (primary) hypertension: Secondary | ICD-10-CM | POA: Diagnosis not present

## 2020-07-06 DIAGNOSIS — E890 Postprocedural hypothyroidism: Secondary | ICD-10-CM | POA: Diagnosis not present

## 2020-07-06 DIAGNOSIS — E7849 Other hyperlipidemia: Secondary | ICD-10-CM | POA: Diagnosis not present

## 2020-07-06 DIAGNOSIS — R7309 Other abnormal glucose: Secondary | ICD-10-CM | POA: Diagnosis not present

## 2020-07-12 MED ORDER — ATORVASTATIN 10 MG TABLET
10 | ORAL_TABLET | ORAL | 4 refills | 90.00000 days | Status: AC
Start: 2020-07-12 — End: 2021-06-20

## 2020-07-19 ENCOUNTER — Inpatient Hospital Stay: Admit: 2020-07-19 | Discharge: 2020-07-19 | Payer: BLUE CROSS/BLUE SHIELD | Primary: Internal Medicine

## 2020-07-19 DIAGNOSIS — Z20822 Contact with and (suspected) exposure to covid-19: Secondary | ICD-10-CM

## 2020-07-19 DIAGNOSIS — J069 Acute upper respiratory infection, unspecified: Secondary | ICD-10-CM

## 2020-07-19 LAB — SARS COV-2 (COVID-19) RNA- REFERENCE LAB (BH GH LMW Q YH): BKR SARS-COV-2 RNA (COVID-19) (YH): NEGATIVE

## 2020-08-17 DIAGNOSIS — M25561 Pain in right knee: Secondary | ICD-10-CM | POA: Diagnosis not present

## 2020-08-18 ENCOUNTER — Encounter: Admit: 2020-08-18 | Payer: PRIVATE HEALTH INSURANCE | Attending: Obstetrics and Gynecology | Primary: Internal Medicine

## 2020-08-18 ENCOUNTER — Ambulatory Visit: Admit: 2020-08-18 | Payer: BLUE CROSS/BLUE SHIELD | Attending: Obstetrics and Gynecology | Primary: Internal Medicine

## 2020-08-18 DIAGNOSIS — Z9889 Other specified postprocedural states: Secondary | ICD-10-CM

## 2020-08-18 DIAGNOSIS — K5790 Diverticulosis of intestine, part unspecified, without perforation or abscess without bleeding: Secondary | ICD-10-CM

## 2020-08-18 DIAGNOSIS — E785 Hyperlipidemia, unspecified: Secondary | ICD-10-CM

## 2020-08-18 DIAGNOSIS — G2581 Restless legs syndrome: Secondary | ICD-10-CM

## 2020-08-18 DIAGNOSIS — T7840XA Allergy, unspecified, initial encounter: Secondary | ICD-10-CM

## 2020-08-18 DIAGNOSIS — Z01419 Encounter for gynecological examination (general) (routine) without abnormal findings: Secondary | ICD-10-CM

## 2020-08-18 DIAGNOSIS — K648 Other hemorrhoids: Secondary | ICD-10-CM

## 2020-08-18 NOTE — Progress Notes
BJY:NWGNF Carmela Piechowski is a 59 y.o. G41P1102 female who presents to this practice for an annual exam.No LMP recorded. Patient is postmenopausal.Patient History?	Problem List: has Hyperlipidemia; Colon adenoma; Cervical strain, acute, initial encounter; and Muscle tension dysphonia on their problem list. ?	Past Surgical History: has a past surgical history that includes b/l bunionectomy; carpal tunnel ; b/l; Cesarean section; repair ? perforated ulcer at birth; and Breast biopsy (Right, 8/14).?	Past Medical History: has a past medical history of Allergy, Diverticulosis, H/O colonoscopy (09/03/2015), Hemorrhoids, internal, Hyperlipidemia, and Restless legs.?	Family History: family history includes Coronary Artery Disease in her brother and mother; Heart attack in her brother; Heart disease in her brother, father, and mother; High cholesterol in her mother; Hypertension in her mother; Kidney failure in her father; Lung cancer in her father; No Known Problems in her son and son; Throat cancer in her father.?	Allergies:is allergic to kiwi and sulfa (sulfonamide antibiotics). ?	Medications: ?	Current Outpatient Medications: ?	?  ALPRAZolam, 0.5 mg, Oral, Nightly PRN?	?  ascorbic acid-elderberry fruit, 1 tablet, Oral, BID?	?  atorvastatin, TAKE 1 TABLET BY MOUTH EVERY DAY?	?  calcium carbonate-vitamin D3, Take by mouth.?	?  fluticasone propionate, 2 spray, each nostril, Daily?	?  multivitamin, 1 capsule, Oral, Daily?	?  Fish OiL, 1 capsule, Oral, Daily ?	Social History: reports that she has quit smoking. She has never used smokeless tobacco. She reports current alcohol use. She reports that she does not use drugs. ?	Concerns for Domestic Violence: NoneGYN HistoryContraceptive History                   Menstrual Cycle  No LMP recorded. Patient is postmenopausal.                                 Sexual History OB History Gravida Para Term Preterm AB Living 2 2 1 1   2  SAB TAB Ectopic Molar Multiple Live Births           2  # Outcome Date GA Lbr Len/2nd Weight Sex Delivery Anes PTL Lv 2 Term 1991 [redacted]w[redacted]d   M VBAC   LIV 1 Preterm 1990 [redacted]w[redacted]d   M CS-Unspec   LIV General: Patient denies fevers, chills, sweats, loss of appetite, fatigue, malaise. Eyes: Patient denies blurring, double vision, vision loss, eye pain, light sensitivity. Ears/Nose/Throat: Patient denies earache, ringing in ears, decreased hearing, nasal congestion, difficulty swallowing. Cardiovascular: Patient denies chest pains, palpitations, fainting spells, shortness of breath, ankle swelling.  Respiratory: Patient denies cough, wheezing.  Skin: Patient denies rash, itching, suspicious lesions. GI: Patient denies nausea, vomiting, diarrhea, constipation, change in bowel habits, abdominal pain, bloody or black stools.  GU: Patient denies pain with urination, blood in urine, frequent urination, loss of bladder control.  Musculoskeletal: Patient denies back pain, joint pain, joint swelling, muscle cramps, muscle weakness, stiffness, arthritis. Neurological: Patient denies transient paralysis, weakness, numbness, tingling sensation, seizures, tremors, headaches.  Psychiatric: Patient denies depression, anxiety, memory loss, suicidal thoughts, hallucinations, paranoia. Endocrine: Patient denies cold intolerance, heat intolerance, increased thirst, increased appetite, large quantities of urine. Heme/Lymphatic: Patient denies abnormal bruising, bleeding, enlarged lymph nodes. Allergy/Immunologic: Patient denies persistent infections, HIV exposures. Female/Reproductive: Patient denies irregular menses, heavy bleeding, dysmenorrhea, spotting between periods, breast tenderness, breast lump, breast enlargement, nipple discharge.  Objective: ?	BP 114/72  - Ht 5' 1 (1.549 m)  - Wt 59.9 kg  - BMI 24.94 kg/m? Physical Exam ?	General:  Appears well, no distress	?	Lungs: Good breath sounds; no rales or rhonchi.?	Thyroid:normal to inspection and palpation?	Heart:  Rhythm regular.  Normal S1 and S2.  No murmurs, gallops, or rubs.	?	Breast Exam:Normal appearance, no masses or tenderness?	Abdomen: soft, nontender, no masses palpated, no hepatosplenomegaly well healed scars	?	External Genitalia: General appearance; normal	?	Uterus:Normal size, contour, non-tender?	Vagina: Normal appearance	?	Cervix::Cervix has normal appearance pap sent			  ?	Adnexa: Normal	?	Rectal: no masses		?	Patient consented for cultures: NoHealth Maintenance: Colonoscopy:  RecommendedDexa Bone Density:  N/AMammogram:  Ordered testBreast [place]: Ordered todayPAP:  Completed todayPCP: Tortorello, JosephAssessment / Plan: Almedia Cordell is a 59 y.o. G82P1102 female who presents without complaints.Current Plans?	Sure Path Pap - Routine ?	UA without Microscopy (in house) Routine (if approved by patient) ?	Occult Blood Feces Screen- Routine (if approved by patient) ?	Annual Exam - Routine ?	Mammogram - Routine ?	Bone Density, if needed - Routine ?	Instructed to schedule colonoscopy, if needed ?	Dicussed the importance of a well-balanced healthy diet and regular exercise?	The patient was reminded to return in one year for a complete annual GYN examPatient Education:?	Specific topics reviewed: see aboveTime 30 minutesMiriam Kayleigh Broadwell, MD

## 2020-08-24 LAB — SUREPATH IMAGING PAP AND HPV MRNA E6/E7 RFL GENOTYPES 16,18/45 (Q)
BKR WAM MCH (PG): 28.1 pg (ref 25.0–35.0)
COMMENT:: >60 mL/min/1.73m2 (ref >60)
HPV MRNA E6/E7, SUREPATH VIAL: NOT DETECTED
INTERPRETATION/RESULT:: 0 % (ref 0.0–0.0)

## 2020-09-07 ENCOUNTER — Ambulatory Visit: Admit: 2020-09-07 | Payer: PRIVATE HEALTH INSURANCE | Attending: Family | Primary: Internal Medicine

## 2020-09-07 ENCOUNTER — Inpatient Hospital Stay: Admit: 2020-09-07 | Discharge: 2020-09-07 | Payer: BLUE CROSS/BLUE SHIELD | Primary: Internal Medicine

## 2020-09-07 DIAGNOSIS — R197 Diarrhea, unspecified: Secondary | ICD-10-CM

## 2020-09-07 DIAGNOSIS — Z20822 Contact with and (suspected) exposure to covid-19: Secondary | ICD-10-CM

## 2020-09-27 ENCOUNTER — Inpatient Hospital Stay: Admit: 2020-09-27 | Discharge: 2020-09-27 | Payer: BLUE CROSS/BLUE SHIELD | Primary: Internal Medicine

## 2020-09-27 ENCOUNTER — Encounter: Admit: 2020-09-27 | Payer: PRIVATE HEALTH INSURANCE | Attending: Family | Primary: Internal Medicine

## 2020-09-27 DIAGNOSIS — Z0000 Encounter for general adult medical examination without abnormal findings: Secondary | ICD-10-CM

## 2020-09-27 DIAGNOSIS — Z20822 Contact with and (suspected) exposure to covid-19: Secondary | ICD-10-CM

## 2020-09-28 LAB — SARS COV-2 (COVID-19) RNA- REFERENCE LAB (BH GH LMW Q YH): BKR SARS-COV-2 RNA (COVID-19) (YH): NEGATIVE

## 2020-10-08 MED ORDER — ALPRAZOLAM 0.5 MG TABLET
0.5 | ORAL_TABLET | Freq: Every evening | ORAL | 2 refills | 25.00000 days | Status: AC | PRN
Start: 2020-10-08 — End: 2021-01-11

## 2020-12-09 MED ORDER — FLUTICASONE PROPIONATE 50 MCG/ACTUATION NASAL SPRAY,SUSPENSION
50 | Freq: Every day | NASAL | 1 refills | 30.00000 days | Status: AC
Start: 2020-12-09 — End: ?

## 2021-01-04 DIAGNOSIS — M1711 Unilateral primary osteoarthritis, right knee: Secondary | ICD-10-CM | POA: Diagnosis not present

## 2021-01-05 DIAGNOSIS — J069 Acute upper respiratory infection, unspecified: Secondary | ICD-10-CM | POA: Diagnosis not present

## 2021-01-11 ENCOUNTER — Ambulatory Visit: Admit: 2021-01-11 | Payer: PRIVATE HEALTH INSURANCE | Attending: Internal Medicine | Primary: Internal Medicine

## 2021-01-11 ENCOUNTER — Encounter: Admit: 2021-01-11 | Payer: PRIVATE HEALTH INSURANCE | Attending: Internal Medicine | Primary: Internal Medicine

## 2021-01-11 DIAGNOSIS — Z20822 Contact with and (suspected) exposure to covid-19: Secondary | ICD-10-CM

## 2021-01-11 DIAGNOSIS — Z20828 Contact with and (suspected) exposure to other viral communicable diseases: Secondary | ICD-10-CM

## 2021-01-11 LAB — SARS COV-2 (COVID-19) RNA- REFERENCE LAB (BH GH LMW Q YH): BKR SARS-COV-2 RNA (COVID-19) (YH): NEGATIVE

## 2021-01-11 MED ORDER — ACYCLOVIR 5 % TOPICAL OINTMENT
5 | Freq: Every day | TOPICAL | 1 refills | 15.00000 days | Status: AC
Start: 2021-01-11 — End: 2021-06-17

## 2021-01-11 MED ORDER — ALPRAZOLAM 0.5 MG TABLET
0.5 | ORAL_TABLET | Freq: Every evening | ORAL | 2 refills | 25.00000 days | Status: AC | PRN
Start: 2021-01-11 — End: 2021-04-13

## 2021-02-18 MED ORDER — MELOXICAM 15 MG TABLET
15 | ORAL_TABLET | Freq: Every day | ORAL | 3 refills | 30.00000 days | Status: AC
Start: 2021-02-18 — End: 2021-06-17

## 2021-03-05 ENCOUNTER — Ambulatory Visit: Admit: 2021-03-05 | Payer: BLUE CROSS/BLUE SHIELD | Primary: Internal Medicine

## 2021-03-05 DIAGNOSIS — Z20828 Contact with and (suspected) exposure to other viral communicable diseases: Secondary | ICD-10-CM

## 2021-03-06 DIAGNOSIS — U071 COVID-19: Secondary | ICD-10-CM

## 2021-03-06 LAB — SARS COV-2 (COVID-19) RNA-~~LOC~~ LABS (BH GH LMW YH): BKR SARS-COV-2 RNA (COVID-19) (YH): POSITIVE — AB

## 2021-03-21 DIAGNOSIS — M19072 Primary osteoarthritis, left ankle and foot: Secondary | ICD-10-CM | POA: Diagnosis not present

## 2021-03-21 DIAGNOSIS — M79671 Pain in right foot: Secondary | ICD-10-CM | POA: Diagnosis not present

## 2021-03-21 DIAGNOSIS — M19071 Primary osteoarthritis, right ankle and foot: Secondary | ICD-10-CM | POA: Diagnosis not present

## 2021-03-21 DIAGNOSIS — M7671 Peroneal tendinitis, right leg: Secondary | ICD-10-CM | POA: Diagnosis not present

## 2021-04-13 MED ORDER — ALPRAZOLAM 0.5 MG TABLET
0.5 | ORAL_TABLET | Freq: Every evening | ORAL | 2 refills | 25.00000 days | Status: AC | PRN
Start: 2021-04-13 — End: 2021-07-08

## 2021-04-26 DIAGNOSIS — M1711 Unilateral primary osteoarthritis, right knee: Secondary | ICD-10-CM | POA: Diagnosis not present

## 2021-04-26 DIAGNOSIS — M19071 Primary osteoarthritis, right ankle and foot: Secondary | ICD-10-CM | POA: Diagnosis not present

## 2021-04-30 MED ORDER — MECLIZINE 25 MG TABLET
25 | ORAL_TABLET | Freq: Three times a day (TID) | ORAL | 1 refills | 10.00000 days | Status: AC | PRN
Start: 2021-04-30 — End: 2022-12-22

## 2021-05-24 ENCOUNTER — Inpatient Hospital Stay: Admit: 2021-05-24 | Discharge: 2021-05-24 | Payer: BLUE CROSS/BLUE SHIELD | Primary: Internal Medicine

## 2021-05-24 DIAGNOSIS — Z20822 Contact with and (suspected) exposure to covid-19: Secondary | ICD-10-CM

## 2021-05-24 DIAGNOSIS — J069 Acute upper respiratory infection, unspecified: Secondary | ICD-10-CM

## 2021-05-25 LAB — SARS COV-2 (COVID-19) RNA- REFERENCE LAB (BH GH LMW Q YH): BKR SARS-COV-2 RNA (COVID-19) (YH): NEGATIVE

## 2021-06-16 ENCOUNTER — Inpatient Hospital Stay: Admit: 2021-06-16 | Discharge: 2021-06-16 | Payer: BLUE CROSS/BLUE SHIELD | Primary: Internal Medicine

## 2021-06-16 DIAGNOSIS — Z0000 Encounter for general adult medical examination without abnormal findings: Secondary | ICD-10-CM

## 2021-06-16 LAB — COMPREHENSIVE METABOLIC PANEL
BKR A/G RATIO: 2.2 (ref 1.0–2.2)
BKR ALANINE AMINOTRANSFERASE (ALT): 30 U/L (ref 10–35)
BKR ALBUMIN: 5.1 g/dL — ABNORMAL HIGH (ref 3.6–4.9)
BKR ALKALINE PHOSPHATASE: 73 U/L (ref 9–122)
BKR ANION GAP: 13 (ref 7–17)
BKR ASPARTATE AMINOTRANSFERASE (AST): 25 U/L (ref 10–35)
BKR AST/ALT RATIO: 0.8
BKR BILIRUBIN TOTAL: 0.8 mg/dL (ref <=1.2)
BKR BLOOD UREA NITROGEN: 16 mg/dL (ref 8–23)
BKR BUN / CREAT RATIO: 24.2 — ABNORMAL HIGH (ref 8.0–23.0)
BKR CALCIUM: 9.9 mg/dL (ref 8.8–10.2)
BKR CHLORIDE: 101 mmol/L (ref 98–107)
BKR CO2: 29 mmol/L — ABNORMAL LOW (ref 6.4–8.2)
BKR EGFR, CREATININE (CKD-EPI 2021): >60 mL/min/{1.73_m2} (ref >=60)
BKR GLOBULIN: 2.3 g/dL — ABNORMAL HIGH (ref 5.5–7.5)
BKR GLUCOSE: 92 mg/dL (ref 70–100)
BKR PROTEIN TOTAL: 7.4 g/dL (ref 6.6–8.7)
BKR SODIUM: 143 mmol/L (ref 136–144)

## 2021-06-16 LAB — CBC WITH AUTO DIFFERENTIAL
BKR POTASSIUM: 45.1 % — ABNORMAL HIGH (ref 35.00–45.00)
BKR WAM ABSOLUTE IMMATURE GRANULOCYTES.: 0.01 x 1000/??L (ref 0.00–0.30)
BKR WAM ABSOLUTE LYMPHOCYTE COUNT.: 1.53 x 1000/ÂµL (ref 0.60–3.70)
BKR WAM ABSOLUTE NRBC (2 DEC): 0 x 1000/??L (ref 0.00–1.00)
BKR WAM ANALYZER ANC: 2.87 x 1000/ÂµL (ref 2.00–7.60)
BKR WAM BASOPHIL ABSOLUTE COUNT.: 0.03 x 1000/??L (ref 0.00–1.00)
BKR WAM BASOPHILS: 0.6 % (ref 0.0–1.4)
BKR WAM EOSINOPHIL ABSOLUTE COUNT.: 0.17 x 1000/ÂµL (ref 0.00–1.00)
BKR WAM EOSINOPHILS: 3.3 % — ABNORMAL HIGH (ref 0.0–5.0)
BKR WAM HEMATOCRIT (2 DEC): 45.1 % — ABNORMAL HIGH (ref 35.00–45.00)
BKR WAM HEMOGLOBIN: 14.2 g/dL — ABNORMAL HIGH (ref 11.7–15.5)
BKR WAM IMMATURE GRANULOCYTES: 0.2 % (ref 0.0–1.0)
BKR WAM LYMPHOCYTES: 29.7 % (ref 17.0–50.0)
BKR WAM MCH (PG): 28.9 pg (ref 27.0–33.0)
BKR WAM MCHC: 31.5 g/dL (ref 31.0–36.0)
BKR WAM MCV: 91.7 fL — ABNORMAL HIGH (ref 80.0–100.0)
BKR WAM MONOCYTE ABSOLUTE COUNT.: 0.54 x 1000/??L (ref 0.00–1.00)
BKR WAM MONOCYTES: 10.5 % (ref 4.0–12.0)
BKR WAM MPV: 11 fL (ref 8.0–12.0)
BKR WAM NEUTROPHILS: 55.7 % (ref 39.0–72.0)
BKR WAM NUCLEATED RED BLOOD CELLS: 0 % (ref 0.0–1.0)
BKR WAM PLATELETS: 289 x1000/??L (ref 150–420)
BKR WAM RDW-CV: 12.9 % (ref 11.0–15.0)
BKR WAM RED BLOOD CELL COUNT.: 4.92 M/??L (ref 4.00–6.00)
BKR WAM WHITE BLOOD CELL COUNT: 5.2 x1000/??L (ref 5–15)

## 2021-06-16 LAB — LIPID PANEL
BKR CHOLESTEROL/HDL RATIO: 4 (ref 0.0–5.0)
BKR CHOLESTEROL: 200 mg/dL — ABNORMAL HIGH
BKR HDL CHOLESTEROL: 50 mg/dL (ref >=40)
BKR LDL CHOLESTEROL SAMPSON CALCULATED: 128 mg/dL — ABNORMAL HIGH
BKR TRIGLYCERIDES: 121 mg/dL

## 2021-06-16 LAB — URINALYSIS WITH CULTURE REFLEX      (BH LMW YH)
BKR BILIRUBIN, UA: NEGATIVE
BKR BLOOD, UA: NEGATIVE
BKR GLUCOSE, UA: NEGATIVE
BKR KETONES, UA: NEGATIVE
BKR LEUKOCYTE ESTERASE, UA: NEGATIVE
BKR NITRITE, UA: NEGATIVE
BKR PH, UA: 7 (ref 5.5–7.5)
BKR PROTEIN, UA: NEGATIVE
BKR SPECIFIC GRAVITY, UA: 1.014 (ref 1.005–1.030)
BKR UROBILINOGEN, UA: <2 EU/dL (ref <=2.0)

## 2021-06-16 LAB — UA REFLEX CULTURE

## 2021-06-16 LAB — TSH W/REFLEX TO FT4     (BH GH LMW Q YH): BKR THYROID STIMULATING HORMONE: 2.03 u[IU]/mL

## 2021-06-17 ENCOUNTER — Ambulatory Visit: Payer: Self-pay

## 2021-06-20 MED ORDER — ATORVASTATIN 10 MG TABLET
10 | ORAL_TABLET | Freq: Every day | ORAL | 4 refills | 90.00000 days | Status: AC
Start: 2021-06-20 — End: 2022-06-22

## 2021-07-05 ENCOUNTER — Inpatient Hospital Stay: Admit: 2021-07-05 | Discharge: 2021-07-05 | Payer: BLUE CROSS/BLUE SHIELD | Primary: Internal Medicine

## 2021-07-05 DIAGNOSIS — R300 Dysuria: Secondary | ICD-10-CM

## 2021-07-05 LAB — URINALYSIS WITH CULTURE REFLEX      (BH LMW YH)
BKR BILIRUBIN, UA: NEGATIVE
BKR BLOOD, UA: NEGATIVE x 1000/??L (ref 0.00–0.30)
BKR GLUCOSE, UA: NEGATIVE x 1000/??L (ref 0.00–1.00)
BKR KETONES, UA: NEGATIVE mL/min/1.73m2 (ref >=60–1.00)
BKR LEUKOCYTE ESTERASE, UA: NEGATIVE
BKR NITRITE, UA: NEGATIVE
BKR PH, UA: 8 — ABNORMAL HIGH (ref 5.5–7.5)
BKR PROTEIN, UA: NEGATIVE
BKR SPECIFIC GRAVITY, UA: 1.008 (ref 1.005–1.030)
BKR UROBILINOGEN, UA: <2 EU/dL (ref <=2.0)

## 2021-07-05 LAB — UA REFLEX CULTURE

## 2021-07-08 MED ORDER — ALPRAZOLAM 0.5 MG TABLET
0.5 | ORAL_TABLET | Freq: Every evening | ORAL | 2 refills | 25.00000 days | Status: AC | PRN
Start: 2021-07-08 — End: 2021-09-28

## 2021-07-18 DIAGNOSIS — Z1389 Encounter for screening for other disorder: Secondary | ICD-10-CM | POA: Diagnosis not present

## 2021-07-18 DIAGNOSIS — Z1331 Encounter for screening for depression: Secondary | ICD-10-CM | POA: Diagnosis not present

## 2021-07-18 DIAGNOSIS — Z6841 Body Mass Index (BMI) 40.0 and over, adult: Secondary | ICD-10-CM | POA: Diagnosis not present

## 2021-07-18 DIAGNOSIS — R7309 Other abnormal glucose: Secondary | ICD-10-CM | POA: Diagnosis not present

## 2021-07-18 DIAGNOSIS — E039 Hypothyroidism, unspecified: Secondary | ICD-10-CM | POA: Diagnosis not present

## 2021-07-18 DIAGNOSIS — I10 Essential (primary) hypertension: Secondary | ICD-10-CM | POA: Diagnosis not present

## 2021-07-18 DIAGNOSIS — C8170 Other classical Hodgkin lymphoma, unspecified site: Secondary | ICD-10-CM | POA: Diagnosis not present

## 2021-07-18 DIAGNOSIS — F419 Anxiety disorder, unspecified: Secondary | ICD-10-CM | POA: Diagnosis not present

## 2021-07-18 DIAGNOSIS — E782 Mixed hyperlipidemia: Secondary | ICD-10-CM | POA: Diagnosis not present

## 2021-07-27 MED ORDER — CLOTRIMAZOLE-BETAMETHASONE 1 %-0.05 % TOPICAL CREAM
1-0.05 | Freq: Two times a day (BID) | TOPICAL | 1 refills | 25.00000 days | Status: AC
Start: 2021-07-27 — End: 2022-04-17

## 2021-08-11 ENCOUNTER — Inpatient Hospital Stay: Admit: 2021-08-11 | Discharge: 2021-08-11 | Payer: BLUE CROSS/BLUE SHIELD | Primary: Internal Medicine

## 2021-08-11 DIAGNOSIS — Z20822 Contact with and (suspected) exposure to covid-19: Secondary | ICD-10-CM

## 2021-08-11 LAB — SARS COV-2 (COVID-19) RNA- REFERENCE LAB (BH GH LMW Q YH): BKR SARS-COV-2 RNA (COVID-19) (YH): NEGATIVE

## 2021-08-12 DIAGNOSIS — Z23 Encounter for immunization: Secondary | ICD-10-CM | POA: Diagnosis not present

## 2021-08-23 DIAGNOSIS — M1711 Unilateral primary osteoarthritis, right knee: Secondary | ICD-10-CM | POA: Diagnosis not present

## 2021-09-05 DIAGNOSIS — J22 Unspecified acute lower respiratory infection: Secondary | ICD-10-CM | POA: Diagnosis not present

## 2021-09-05 DIAGNOSIS — R059 Cough, unspecified: Secondary | ICD-10-CM | POA: Diagnosis not present

## 2021-09-28 MED ORDER — ALPRAZOLAM 0.5 MG TABLET
0.5 | ORAL_TABLET | Freq: Every evening | ORAL | 2 refills | 25.00000 days | Status: AC | PRN
Start: 2021-09-28 — End: 2022-01-13

## 2021-10-12 MED ORDER — AMOXICILLIN 875 MG TABLET
875 | ORAL_TABLET | Freq: Two times a day (BID) | ORAL | 1 refills | 10.00000 days | Status: AC
Start: 2021-10-12 — End: ?

## 2021-10-14 ENCOUNTER — Encounter: Admit: 2021-10-14 | Payer: BLUE CROSS/BLUE SHIELD | Attending: Obstetrics and Gynecology | Primary: Internal Medicine

## 2021-10-14 DIAGNOSIS — J329 Chronic sinusitis, unspecified: Secondary | ICD-10-CM | POA: Diagnosis not present

## 2021-11-04 ENCOUNTER — Ambulatory Visit: Admit: 2021-11-04 | Payer: PRIVATE HEALTH INSURANCE | Attending: Obstetrics and Gynecology | Primary: Internal Medicine

## 2021-11-04 ENCOUNTER — Encounter: Admit: 2021-11-04 | Payer: PRIVATE HEALTH INSURANCE | Attending: Obstetrics and Gynecology | Primary: Internal Medicine

## 2021-11-04 DIAGNOSIS — K5790 Diverticulosis of intestine, part unspecified, without perforation or abscess without bleeding: Secondary | ICD-10-CM

## 2021-11-04 DIAGNOSIS — R922 Inconclusive mammogram: Secondary | ICD-10-CM

## 2021-11-04 DIAGNOSIS — K648 Other hemorrhoids: Secondary | ICD-10-CM

## 2021-11-04 DIAGNOSIS — Z01419 Encounter for gynecological examination (general) (routine) without abnormal findings: Secondary | ICD-10-CM

## 2021-11-04 DIAGNOSIS — G2581 Restless legs syndrome: Secondary | ICD-10-CM

## 2021-11-04 DIAGNOSIS — T7840XA Allergy, unspecified, initial encounter: Secondary | ICD-10-CM

## 2021-11-04 DIAGNOSIS — Z9889 Other specified postprocedural states: Secondary | ICD-10-CM

## 2021-11-04 DIAGNOSIS — E785 Hyperlipidemia, unspecified: Secondary | ICD-10-CM

## 2021-11-04 NOTE — Progress Notes
ZOX:WRUEA Autumn [name] is a 61 y.o. G51P1102 female who presents to this practice for an annual exam.No LMP recorded. Patient is postmenopausal. She has no complaints.Patient History?	Problem List: has Hyperlipidemia and Colon adenoma on their problem list. ?	Past Surgical History: has a past surgical history that includes b/l bunionectomy; carpal tunnel ; b/l; Cesarean section; repair ? perforated ulcer at birth; and Breast biopsy (Right, 8/14).?	Past Medical History: has a past medical history of Allergy, Diverticulosis, H/O colonoscopy (09/03/2015), Hemorrhoids, internal, Hyperlipidemia, and Restless legs.?	Family History: family history includes Coronary Artery Disease in her brother and mother; Heart attack in her brother; Heart disease in her brother, father, and mother; High cholesterol in her mother; Hypertension in her mother; Kidney failure in her father; Lung cancer in her father; No Known Problems in her son and son; Throat cancer in her father.?	Allergies:is allergic to kiwi and sulfa (sulfonamide antibiotics). ?	Medications: ?	Current Outpatient Medications: ?	?  ALPRAZolam, 0.5 mg, Oral, Nightly PRN?	?  atorvastatin, 10 mg, Oral, Daily?	?  calcium carbonate-vitamin D3, Take by mouth.?	?  fluticasone propionate, 2 spray, each nostril, Daily?	?  meclizine, 25 mg, Oral, TID PRN?	?  multivitamin, 1 capsule, Oral, Daily?	?  Fish OiL, 1 capsule, Oral, Daily?	?  clotrimazole-betamethasone, Apply topically 2 (two) times daily. (Patient not taking: Reported on 11/04/2021) ?	Social History: reports that she has quit smoking. She has never used smokeless tobacco. She reports current alcohol use. She reports that she does not use drugs. ?	Concerns for Domestic Violence: NoneGYN HistoryContraceptive History                   Menstrual Cycle  No LMP recorded. Patient is postmenopausal.                                 Sexual History OB History Gravida Para Term Preterm AB Living 2 2 1 1   2  SAB IAB Ectopic Molar Multiple Live Births           2  # Outcome Date GA Lbr Len/2nd Weight Sex Delivery Anes PTL Lv 2 Term 1991 [redacted]w[redacted]d   M VBAC   LIV 1 Preterm 1990 [redacted]w[redacted]d   M CS-Unspec   LIV General: Patient denies fevers, chills, sweats, loss of appetite, fatigue, malaise. Eyes: Patient denies blurring, double vision, vision loss, eye pain, light sensitivity. Ears/Nose/Throat: Patient denies earache, ringing in ears, decreased hearing, nasal congestion, difficulty swallowing. Cardiovascular: Patient denies chest pains, palpitations, fainting spells, shortness of breath, ankle swelling.  Respiratory: Patient denies cough, wheezing.  Skin: Patient denies rash, itching, suspicious lesions. GI: Patient denies nausea, vomiting, diarrhea, constipation, change in bowel habits, abdominal pain, bloody or black stools.  GU: Patient denies pain with urination, blood in urine, frequent urination, loss of bladder control.  Musculoskeletal: Patient denies back pain, joint pain, joint swelling, muscle cramps, muscle weakness, stiffness, arthritis. Neurological: Patient denies transient paralysis, weakness, numbness, tingling sensation, seizures, tremors, headaches.  Psychiatric: Patient denies depression, anxiety, memory loss, suicidal thoughts, hallucinations, paranoia. Endocrine: Patient denies cold intolerance, heat intolerance, increased thirst, increased appetite, large quantities of urine. Heme/Lymphatic: Patient denies abnormal bruising, bleeding, enlarged lymph nodes. Allergy/Immunologic: Patient denies persistent infections, HIV exposures. Female/Reproductive: Patient denies irregular menses, heavy bleeding, dysmenorrhea, spotting between periods, breast tenderness, breast lump, breast enlargement, nipple discharge.  Objective: ?	BP 108/64  - Wt 60.6 kg  - BMI 25.24 kg/m?  Physical Exam ?	General:  Appears well, no distress	?	Lungs: Good breath sounds; no rales or rhonchi.?	Thyroid:normal to inspection and palpation?	Heart:  Rhythm regular.  Normal S1 and S2.  No murmurs, gallops, or rubs.	?	Breast Exam:Normal appearance, no masses or tenderness?	Abdomen: soft, nontender, no masses palpated, no hepatosplenomegaly	 well healed scars?	External Genitalia: General appearance; normal	?	Uterus:Normal size, contour, non-tender?	Vagina: Normal appearance	?	Cervix::Cervix has normal appearance pap sent			  ?	Adnexa: Normal	?	Rectal: deferred		?	Patient consented for cultures: N/AHealth Maintenance: Colonoscopy:  Up to dateDexa Bone Density:  N/AMammogram:  Up to dateBreast [place]: Up to datePAP:  Completed todayPCP: Tortorello, JosephAssessment / Plan: Autumn [name] is a 61 y.o. G30P1102 female who presents today without complaints.Current Plans?	Sure Path Pap - Routine ?	UA without Microscopy (in house) Routine (if approved by patient) ?	Occult Blood Feces Screen- Routine (if approved by patient) ?	Annual Exam - Routine ?	Mammogram - Routine ?	Bone Density, if needed - Routine ?	Instructed to schedule colonoscopy, if needed ?	Dicussed the importance of a well-balanced healthy diet and regular exercise?	The patient was reminded to return in one year for a complete annual GYN examPatient Education:?	Specific topics reviewed: see aboveTime 30 minutesMiriam Lizbeth Feijoo, MD

## 2021-11-29 ENCOUNTER — Telehealth: Admit: 2021-11-29 | Payer: PRIVATE HEALTH INSURANCE | Attending: Obstetrics and Gynecology | Primary: Internal Medicine

## 2021-11-29 NOTE — Telephone Encounter
Patient called stating that she had a annual with Dr. Brigitte Pulse and that a breast exam was never done. Patient was very adamant that it was not completed and that she wanted to have one done. This Clinical research associate advised patient that Dr Brigitte Pulse was out of the office and that I could get her seen next week. Patient agreed and is booked for an appt on 12/09/21 for an appt for a breast exam. About an hour later, patient called back requesting to speak to the office manager as she wanted to make sure that she would not get charged for the visit. This writer could not guarantee that she wouldn't get charged. This Clinical research associate wrote down patients information and advised that I would turn her information over to the office manager but that she may not receive a call back until next week as previously stated that Dr Brigitte Pulse was out of the office. This Clinical research associate asked patient if she would like to reschedule appt until she heard back from office manager and provider, patient declined.

## 2021-12-09 ENCOUNTER — Ambulatory Visit: Admit: 2021-12-09 | Payer: PRIVATE HEALTH INSURANCE | Attending: Obstetrics and Gynecology | Primary: Internal Medicine

## 2021-12-09 DIAGNOSIS — R922 Inconclusive mammogram: Secondary | ICD-10-CM

## 2021-12-09 NOTE — Progress Notes
HPI:   Autumn [name] is a 61 y.o. female who presents to this practice for a breast examNo LMP recorded. Patient is postmenopausal.Patient History?	 Problem List: has Hyperlipidemia and Colon adenoma on their problem list. ?	Past Surgical History: has a past surgical history that includes b/l bunionectomy; carpal tunnel ; b/l; Cesarean section; repair ? perforated ulcer at birth; and Breast biopsy (Right, 8/14).?	Past Medical History: has a past medical history of Allergy, Diverticulosis, H/O colonoscopy (09/03/2015), Hemorrhoids, internal, Hyperlipidemia, and Restless legs.?	Family History: family history includes Coronary Artery Disease in her brother and mother; Heart attack in her brother; Heart disease in her brother, father, and mother; High cholesterol in her mother; Hypertension in her mother; Kidney failure in her father; Lung cancer in her father; No Known Problems in her son and son; Throat cancer in her father.?	Allergies:is allergic to kiwi and sulfa (sulfonamide antibiotics). ?	Medications: ?	Current Outpatient Medications: ?	?  ALPRAZolam, 0.5 mg, Oral, Nightly PRN?	?  atorvastatin, 10 mg, Oral, Daily?	?  calcium carbonate-vitamin D3, Take by mouth.?	?  clotrimazole-betamethasone, Apply topically 2 (two) times daily. (Patient not taking: Reported on 11/04/2021)?	?  fluticasone propionate, 2 spray, each nostril, Daily?	?  meclizine, 25 mg, Oral, TID PRN?	?  multivitamin, 1 capsule, Oral, Daily?	?  Fish OiL, 1 capsule, Oral, Daily ?	Social History: reports that she has quit smoking. She has never used smokeless tobacco. She reports current alcohol use. She reports that she does not use drugs. ?	 OB History Gravida Para Term Preterm AB Living 2 2 1 1   2  SAB IAB Ectopic Molar Multiple Live Births           2  # Outcome Date GA Lbr Len/2nd Weight Sex Delivery Anes PTL Lv 2 Term 1991 [redacted]w[redacted]d   M VBAC   LIV 1 Preterm 1990 [redacted]w[redacted]d   M CS-Unspec LIV Review of Systems  Objective:  BP (!) 140/88  (  lbs.)OBGyn Exam  Breasts: no masses, no discharge, no adenopathyAssessment / Plan:  Breast discomfort.

## 2022-01-13 ENCOUNTER — Encounter: Admit: 2022-01-13 | Payer: PRIVATE HEALTH INSURANCE | Attending: Internal Medicine | Primary: Internal Medicine

## 2022-01-13 MED ORDER — ALPRAZOLAM 0.5 MG TABLET
0.5 | ORAL_TABLET | Freq: Every evening | ORAL | 2 refills | 25.00000 days | Status: AC | PRN
Start: 2022-01-13 — End: 2022-07-11

## 2022-01-24 DIAGNOSIS — F419 Anxiety disorder, unspecified: Secondary | ICD-10-CM | POA: Diagnosis not present

## 2022-01-24 DIAGNOSIS — E7849 Other hyperlipidemia: Secondary | ICD-10-CM | POA: Diagnosis not present

## 2022-01-24 DIAGNOSIS — E6609 Other obesity due to excess calories: Secondary | ICD-10-CM | POA: Diagnosis not present

## 2022-01-24 DIAGNOSIS — Z6837 Body mass index (BMI) 37.0-37.9, adult: Secondary | ICD-10-CM | POA: Diagnosis not present

## 2022-01-24 DIAGNOSIS — J45909 Unspecified asthma, uncomplicated: Secondary | ICD-10-CM | POA: Diagnosis not present

## 2022-01-24 DIAGNOSIS — C8170 Other classical Hodgkin lymphoma, unspecified site: Secondary | ICD-10-CM | POA: Diagnosis not present

## 2022-01-24 DIAGNOSIS — E782 Mixed hyperlipidemia: Secondary | ICD-10-CM | POA: Diagnosis not present

## 2022-01-24 DIAGNOSIS — E890 Postprocedural hypothyroidism: Secondary | ICD-10-CM | POA: Diagnosis not present

## 2022-01-24 DIAGNOSIS — R7309 Other abnormal glucose: Secondary | ICD-10-CM | POA: Diagnosis not present

## 2022-01-24 DIAGNOSIS — J302 Other seasonal allergic rhinitis: Secondary | ICD-10-CM | POA: Diagnosis not present

## 2022-01-24 DIAGNOSIS — M1991 Primary osteoarthritis, unspecified site: Secondary | ICD-10-CM | POA: Diagnosis not present

## 2022-01-24 DIAGNOSIS — Z8571 Personal history of Hodgkin lymphoma: Secondary | ICD-10-CM | POA: Diagnosis not present

## 2022-01-24 DIAGNOSIS — I10 Essential (primary) hypertension: Secondary | ICD-10-CM | POA: Diagnosis not present

## 2022-03-08 DIAGNOSIS — E890 Postprocedural hypothyroidism: Secondary | ICD-10-CM | POA: Diagnosis not present

## 2022-04-02 ENCOUNTER — Ambulatory Visit
Admission: EM | Admit: 2022-04-02 | Discharge: 2022-04-02 | Disposition: A | Discharge status: Home / Self Care | Payer: BC Managed Care – PPO

## 2022-04-02 DIAGNOSIS — H1033 Unspecified acute conjunctivitis, bilateral: Secondary | ICD-10-CM | POA: Diagnosis not present

## 2022-04-02 MED ORDER — OFLOXACIN 0.3 % OP SOLN: 1.0000 [drp] | Freq: Four times a day (QID) | OPHTHALMIC | 0 refills | Status: DC

## 2022-04-02 NOTE — ED Triage Notes (Signed)
Pt states that her right eye started itching and running last night  Pt states that her left eye had crust in it this morning   Denies Fever

## 2022-04-02 NOTE — ED Provider Notes (Signed)
[facility]    CSN: [id number] Arrival date & time: 04/02/22  1020      History   Chief Complaint Chief Complaint  Patient presents with   Conjunctivitis    HPI [name] is a 61 y.o. female.   Presenting today with 1 day history of bilateral eye irritation, redness and thick drainage this morning matting her eyes shut.  Denies visual change, injury to the eye, fever, chills, headache, nausea, vomiting.  Trying warm compresses with no relief.  No known sick contacts recently.  Does work in childcare.   Past Medical History:  Diagnosis Date   Anxiety    Cancer ([place])    hodgkins lymphoma, 1992 Diagnosed   H/O radioactive iodine thyroid ablation 1980's   Heart murmur    History of kidney stones    Hypertension    Hypothyroidism    "thyroid is dead"   Primary localized osteoarthritis of left knee    S/P total knee replacement using cement, left 03/19/2017    Patient Active Problem List   Diagnosis Date Noted   Perforation of sigmoid colon ([place]) 08/02/2017   Pneumonitis 03/30/2017   Fever    Debility 03/28/2017   FUO (fever of unknown origin)    Leukocytosis    Hypokalemia    Acute blood loss anemia    Benign essential HTN    History of total left knee replacement    Perforated bowel ([place]) 03/23/2017   S/P total knee replacement using cement, left 03/12/2017 by Dr Noemi Chapel 03/19/2017   Perforated diverticulum    History of lymphoma    Hyperglycemia    Post-op pain    Perforation of sigmoid colon due to diverticulitis 03/17/2017   Primary localized osteoarthritis of left knee    Hypothyroidism    Hypertension    Cancer ([place])    OSTEOARTHRITIS, KNEE, RIGHT 03/30/2010    Past Surgical History:  Procedure Laterality Date   AXILLARY LYMPH NODE DISSECTION Right 1992   BONE MARROW TRANSPLANT     COLON RESECTION SIGMOID N/A 03/17/2017   Procedure: COLON RESECTION SIGMOID AND CREATION OF COLOSTOMY;  Surgeon: Donnie Mesa, MD;  Location: [place];   Service: General;  Laterality: N/A;   COLONOSCOPY N/A 09/07/2015   Procedure: COLONOSCOPY;  Surgeon: Aviva Signs, MD;  [facility];  Service: Gastroenterology;  Laterality: N/A;   COLOSTOMY REVERSAL N/A 08/02/2017   Procedure: COLOSTOMY REVERSAL;  Surgeon: Donnie Mesa, MD;  Location: [place];  Service: General;  Laterality: N/A;   [place] ARTHROPLASTY Left 03/12/2017   Procedure: LEFT TOTAL KNEE ARTHROPLASTY;  Surgeon: Elsie Saas, MD;  Location: [place];  Service: Orthopedics;  Laterality: Left;    OB History   No obstetric history on file.      Home Medications    Prior to Admission medications   Medication Sig Start Date End Date Taking? Authorizing Provider  ofloxacin (OCUFLOX) 0.3 % ophthalmic solution Place 1 drop into both eyes 4 (four) times daily. 04/02/22  Yes Volney American, PA-C  acetaminophen (TYLENOL) 325 MG tablet Take 1-2 tablets (325-650 mg total) by mouth every 6 (six) hours as needed for mild pain. 03/30/17   Love, Ivan Anchors, PA-C  albuterol (PROVENTIL HFA;VENTOLIN HFA) 108 (90 Base) MCG/ACT inhaler Inhale 2 puffs into the lungs every 4 (four) hours as needed for wheezing or shortness of breath. 07/22/17   Fransico Meadow, PA-C  amLODipine (  NORVASC) 10 MG tablet Take 1 tablet (10 mg total) by mouth every evening. 03/30/17   Love, Ivan Anchors, PA-C  amoxicillin-clavulanate (AUGMENTIN) 875-125 MG tablet Take 1 tablet by mouth every 12 (twelve) hours. 07/02/18   Rancour, Annie Main, MD  aspirin EC 81 MG tablet Take 81 mg by mouth daily.    [provider]  benazepril (LOTENSIN) 20 MG tablet Take 1 tablet (20 mg total) by mouth daily. Patient not taking: Reported on 07/19/2017 03/30/17   Love, Ivan Anchors, PA-C  benazepril-hydrochlorthiazide (LOTENSIN HCT) 20-25 MG tablet Take 1 tablet by mouth daily. 06/05/17   [provider]  cetirizine (ZYRTEC) 10 MG tablet Take 10 mg by mouth daily.    [provider]  diazepam (VALIUM) 2 MG tablet Take 2 mg by mouth as needed. 05/21/17   [provider]  diphenhydrAMINE (BENADRYL) 25 mg capsule Take 25 mg by mouth at bedtime.    [provider]  doxycycline (VIBRAMYCIN) 100 MG capsule Take 1 capsule (100 mg total) by mouth 2 (two) times daily. 07/02/18   Rancour, Annie Main, MD  iron polysaccharides (NIFEREX) 150 MG capsule Take 1 capsule (150 mg total) by mouth 2 (two) times daily before lunch and supper. Patient not taking: Reported on 07/19/2017 03/30/17   Love, Ivan Anchors, PA-C  levothyroxine (SYNTHROID, LEVOTHROID) 88 MCG tablet Take 88 mcg by mouth daily before breakfast.    [provider]  methocarbamol (ROBAXIN) 500 MG tablet Take 0.5-1 tablets (250-500 mg total) by mouth every 8 (eight) hours as needed for muscle spasms. Patient not taking: Reported on 07/19/2017 03/30/17   Flora Lipps  [facility] 10 MG/0.5ML Pen SMARTSIG:10 Milligram(s) SUB-Q Once a Week 03/31/22   [provider]  multivitamin (PROSIGHT) TABS tablet Take 1 tablet by mouth daily. 03/31/17   Love, Ivan Anchors, PA-C  ondansetron (ZOFRAN-ODT) 4 MG disintegrating tablet Take 1 tablet (4 mg total) by mouth every 6 (six) hours as needed for nausea. Patient not taking: Reported on 07/19/2017 03/30/17   Love, Ivan Anchors, PA-C  oxyCODONE (OXY IR/ROXICODONE) 5 MG immediate release tablet Take 1 tablet (5 mg total) by mouth every 4 (four) hours as needed for moderate pain. 08/06/17   Donnie Mesa, MD  pravastatin (PRAVACHOL) 20 MG tablet Take 1 tablet (20 mg total) by mouth every evening. 03/30/17   Love, Ivan Anchors, PA-C  Probiotic Product (PHILLIPS COLON HEALTH PO) Take 1 tablet by mouth daily.    [provider]    Family History Family History  Problem Relation Age of Onset   Breast cancer Mother    Diabetes Father     Social History Social History   Tobacco Use   Smoking status: Former    Types: Cigarettes    Quit date: 05/08/1990    Years  since quitting: 31.9   Smokeless tobacco: Never  Vaping Use   Vaping Use: Never used  Substance Use Topics   Alcohol use: No   Drug use: No     Allergies   Cephalexin, Contrast media [iodinated contrast media], and Iodine-131   Review of Systems Review of Systems Per HPI  Physical Exam Triage Vital Signs ED Triage Vitals  Enc Vitals Group     BP 04/02/22 1109 115/69     Pulse Rate 04/02/22 1109 77     Resp 04/02/22 1109 18     Temp 04/02/22 1109 98.6 F (37 C)     Temp Source 04/02/22 1109 Oral  SpO2 04/02/22 1109 96 %     Weight --      Height --      Head Circumference --      Peak Flow --      Pain Score 04/02/22 1106 0     Pain Loc --      Pain Edu? --      Excl. in [place]? --    No data found.  Updated Vital Signs BP 115/69 (BP Location: Right Arm)   Pulse 77   Temp 98.6 F (37 C) (Oral)   Resp 18   SpO2 96%   Visual Acuity Right Eye Distance:   Left Eye Distance:   Bilateral Distance:    Right Eye Near:   Left Eye Near:    Bilateral Near:     Physical Exam Vitals and nursing note reviewed.  Constitutional:      Appearance: Normal appearance. She is not ill-appearing.  HENT:     Head: Atraumatic.     Nose: Nose normal.     Mouth/Throat:     Mouth: Mucous membranes are moist.  Eyes:     Extraocular Movements: Extraocular movements intact.     Pupils: Pupils are equal, round, and reactive to light.     Comments: Lateral conjunctival erythema, drainage.  Cardiovascular:     Rate and Rhythm: Normal rate and regular rhythm.     Heart sounds: Normal heart sounds.  Pulmonary:     Effort: Pulmonary effort is normal.     Breath sounds: Normal breath sounds.  Musculoskeletal:        General: Normal range of motion.     Cervical back: Normal range of motion and neck supple.  Skin:    General: Skin is warm and dry.  Neurological:     Mental Status: She is alert and oriented to person, place, and time.  Psychiatric:        Mood and Affect:  Mood normal.        Thought Content: Thought content normal.        Judgment: Judgment normal.     UC Treatments / Results  Labs (all labs ordered are listed, but only abnormal results are displayed) Labs Reviewed - No data to display  EKG   Radiology No results found.  Procedures Procedures (including critical care time)  Medications Ordered in UC Medications - No data to display  Initial Impression / Assessment and Plan / UC Course  I have reviewed the triage vital signs and the nursing notes.  Pertinent labs & imaging results that were available during my care of the patient were reviewed by me and considered in my medical decision making (see chart for details).      Treat with Ocuflox drops, warm compresses, good hand hygiene.  Return for worsening symptoms.  Final Clinical Impressions(s) / UC Diagnoses   Final diagnoses:  Acute bacterial conjunctivitis of both eyes   Discharge Instructions   None    ED Prescriptions     Medication Sig Dispense Auth. Provider   ofloxacin (OCUFLOX) 0.3 % ophthalmic solution Place 1 drop into both eyes 4 (four) times daily. 5 mL Volney American, [place]      PDMP not reviewed this encounter.   Volney American, [place] 04/02/22 1137

## 2022-04-10 ENCOUNTER — Telehealth: Admit: 2022-04-10 | Payer: PRIVATE HEALTH INSURANCE | Attending: Internal Medicine | Primary: Internal Medicine

## 2022-04-12 ENCOUNTER — Encounter: Admit: 2022-04-12 | Payer: PRIVATE HEALTH INSURANCE | Primary: Internal Medicine

## 2022-04-12 ENCOUNTER — Encounter: Admit: 2022-04-12 | Payer: PRIVATE HEALTH INSURANCE | Attending: Internal Medicine | Primary: Internal Medicine

## 2022-04-12 ENCOUNTER — Inpatient Hospital Stay: Admit: 2022-04-12 | Discharge: 2022-04-12 | Payer: PRIVATE HEALTH INSURANCE | Primary: Internal Medicine

## 2022-04-12 ENCOUNTER — Ambulatory Visit: Admit: 2022-04-12 | Payer: PRIVATE HEALTH INSURANCE | Attending: Internal Medicine | Primary: Internal Medicine

## 2022-04-12 DIAGNOSIS — Z0000 Encounter for general adult medical examination without abnormal findings: Secondary | ICD-10-CM

## 2022-04-12 LAB — URINALYSIS WITH CULTURE REFLEX      (BH LMW YH)
BKR BILIRUBIN, UA: NEGATIVE
BKR BLOOD, UA: NEGATIVE
BKR GLUCOSE, UA: NEGATIVE
BKR KETONES, UA: NEGATIVE
BKR NITRITE, UA: NEGATIVE
BKR PH, UA: 6 (ref 5.5–7.5)
BKR PROTEIN, UA: NEGATIVE
BKR SPECIFIC GRAVITY, UA: 1.009 (ref 1.005–1.030)
BKR UROBILINOGEN, UA (MG/DL): <2 mg/dL (ref <=2.0)

## 2022-04-12 LAB — CBC WITH AUTO DIFFERENTIAL
BKR BUN / CREAT RATIO: 54.3 % (ref 39.0–72.0)
BKR WAM ABSOLUTE IMMATURE GRANULOCYTES.: 0.01 x 1000/??L (ref 0.00–0.30)
BKR WAM ABSOLUTE LYMPHOCYTE COUNT.: 1.41 x 1000/??L (ref 0.60–3.70)
BKR WAM ABSOLUTE NRBC (2 DEC): 0 x 1000/??L (ref 0.00–1.00)
BKR WAM ANALYZER ANC: 2.44 x 1000/??L (ref 2.00–7.60)
BKR WAM BASOPHIL ABSOLUTE COUNT.: 0.03 x 1000/??L (ref 0.00–1.00)
BKR WAM BASOPHILS: 0.7 % (ref 0.0–1.4)
BKR WAM EOSINOPHIL ABSOLUTE COUNT.: 0.14 x 1000/ÂµL (ref 0.00–1.00)
BKR WAM EOSINOPHILS: 3.1 % (ref 0.0–5.0)
BKR WAM HEMATOCRIT (2 DEC): 41.5 % (ref 35.00–45.00)
BKR WAM HEMOGLOBIN: 13.8 g/dL (ref 11.7–15.5)
BKR WAM IMMATURE GRANULOCYTES: 0.2 % (ref 0.0–1.0)
BKR WAM LYMPHOCYTES: 31.3 % (ref 17.0–50.0)
BKR WAM MCH (PG): 29.7 pg (ref 27.0–33.0)
BKR WAM MCHC: 33.3 g/dL (ref 31.0–36.0)
BKR WAM MCV: 89.2 fL (ref 80.0–100.0)
BKR WAM MONOCYTE ABSOLUTE COUNT.: 0.47 x 1000/??L (ref 0.00–1.00)
BKR WAM MPV: 10.7 fL (ref 8.0–12.0)
BKR WAM NEUTROPHILS: 54.3 % (ref 39.0–72.0)
BKR WAM NUCLEATED RED BLOOD CELLS: 0 % (ref 0.0–1.0)
BKR WAM PLATELETS: 264 x1000/ÂµL (ref 150–420)
BKR WAM RDW-CV: 13 % (ref 11.0–15.0)
BKR WAM RED BLOOD CELL COUNT.: 4.65 M/??L (ref 4.00–6.00)
BKR WAM WHITE BLOOD CELL COUNT: 4.5 x1000/ÂµL (ref 4.0–11.0)

## 2022-04-12 LAB — LIPID PANEL
BKR CHOLESTEROL/HDL RATIO: 3.8 ??IU/mL (ref 0.0–5.0)
BKR CHOLESTEROL: 181 mg/dL
BKR HDL CHOLESTEROL: 48 mg/dL (ref >=40)
BKR LDL CHOLESTEROL SAMPSON CALCULATED: 109 mg/dL — ABNORMAL HIGH (ref 4.0–11.0)
BKR TRIGLYCERIDES: 136 mg/dL

## 2022-04-12 LAB — COMPREHENSIVE METABOLIC PANEL
BKR A/G RATIO: 1.6 (ref 1.0–2.2)
BKR ALANINE AMINOTRANSFERASE (ALT): 28 U/L (ref 10–35)
BKR ALBUMIN: 4.5 g/dL (ref 3.6–4.9)
BKR ALKALINE PHOSPHATASE: 71 U/L (ref 9–122)
BKR ANION GAP: 9 (ref 7–17)
BKR ASPARTATE AMINOTRANSFERASE (AST): 29 U/L (ref 10–35)
BKR AST/ALT RATIO: 1
BKR BILIRUBIN TOTAL: 0.6 mg/dL (ref 0.0–<=1.2)
BKR BLOOD UREA NITROGEN: 13 mg/dL (ref 8–23)
BKR CALCIUM: 9.2 mg/dL (ref 8.8–10.2)
BKR CHLORIDE: 103 mmol/L (ref 98–107)
BKR CO2: 30 mmol/L (ref 20–30)
BKR CREATININE: 0.6 mg/dL (ref 0.40–1.30)
BKR EGFR, CREATININE (CKD-EPI 2021): >60 mL/min/{1.73_m2} (ref >=60–1.00)
BKR GLOBULIN: 2.9 g/dL (ref 2.3–3.5)
BKR GLUCOSE: 94 mg/dL (ref 70–100)
BKR POTASSIUM: 4.4 mmol/L (ref 3.3–5.3)
BKR PROTEIN TOTAL: 7.4 g/dL (ref 6.6–8.7)
BKR SODIUM: 142 mmol/L (ref 136–144)
BKR WAM MONOCYTES: 4.5 g/dL (ref 3.6–4.9)

## 2022-04-12 LAB — TSH W/REFLEX TO FT4     (BH GH LMW Q YH): BKR THYROID STIMULATING HORMONE: 1.91 [IU]/mL

## 2022-04-12 LAB — HEMOGLOBIN A1C
BKR ESTIMATED AVERAGE GLUCOSE: 126 mg/dL
BKR HEMOGLOBIN A1C: 6 % — ABNORMAL HIGH (ref 4.0–5.6)

## 2022-04-12 LAB — URINE MICROSCOPIC     (BH GH LMW YH)
BKR RBC/HPF INSTRUMENT: <1 /HPF (ref 0–2)
BKR URINE SQUAMOUS EPITHELIAL CELLS, UA (NUMERIC): <1 /HPF (ref 0–5)
BKR WBC/HPF INSTRUMENT: 3 /HPF (ref 0–5)

## 2022-04-12 LAB — UA REFLEX CULTURE

## 2022-04-13 LAB — URINE CULTURE

## 2022-04-17 ENCOUNTER — Encounter: Admit: 2022-04-17 | Payer: PRIVATE HEALTH INSURANCE | Attending: Internal Medicine | Primary: Internal Medicine

## 2022-04-17 ENCOUNTER — Ambulatory Visit: Admit: 2022-04-17 | Payer: Managed Care (Private) | Attending: Internal Medicine | Primary: Internal Medicine

## 2022-04-18 ENCOUNTER — Telehealth: Admit: 2022-04-18 | Payer: PRIVATE HEALTH INSURANCE | Attending: Gastroenterology | Primary: Internal Medicine

## 2022-04-18 MED ORDER — SODIUM,POTASSIUM,MAG SULFATES 17.5 GRAM-3.13 GRAM-1.6 GRAM ORAL SOLN
17.5-3.13-1.6 | ORAL | 1 refills | 2.00000 days | Status: AC
Start: 2022-04-18 — End: 2022-09-14

## 2022-04-19 ENCOUNTER — Encounter: Admit: 2022-04-19 | Payer: PRIVATE HEALTH INSURANCE | Attending: Internal Medicine | Primary: Internal Medicine

## 2022-04-20 ENCOUNTER — Encounter: Admit: 2022-04-20 | Payer: PRIVATE HEALTH INSURANCE | Attending: Gastroenterology | Primary: Internal Medicine

## 2022-05-16 ENCOUNTER — Encounter: Admit: 2022-05-16 | Payer: PRIVATE HEALTH INSURANCE | Attending: Internal Medicine | Primary: Internal Medicine

## 2022-05-16 DIAGNOSIS — E782 Mixed hyperlipidemia: Secondary | ICD-10-CM | POA: Diagnosis not present

## 2022-05-16 DIAGNOSIS — E7849 Other hyperlipidemia: Secondary | ICD-10-CM | POA: Diagnosis not present

## 2022-05-16 DIAGNOSIS — E119 Type 2 diabetes mellitus without complications: Secondary | ICD-10-CM | POA: Diagnosis not present

## 2022-05-16 DIAGNOSIS — E890 Postprocedural hypothyroidism: Secondary | ICD-10-CM | POA: Diagnosis not present

## 2022-05-16 DIAGNOSIS — Z6836 Body mass index (BMI) 36.0-36.9, adult: Secondary | ICD-10-CM | POA: Diagnosis not present

## 2022-05-16 DIAGNOSIS — R7309 Other abnormal glucose: Secondary | ICD-10-CM | POA: Diagnosis not present

## 2022-05-16 MED ORDER — ONDANSETRON HCL 4 MG TABLET
4 | ORAL_TABLET | Freq: Three times a day (TID) | ORAL | 1 refills | 7.00000 days | Status: AC | PRN
Start: 2022-05-16 — End: 2022-09-14

## 2022-05-22 DIAGNOSIS — Z86010 Personal history of colonic polyps: Secondary | ICD-10-CM | POA: Diagnosis not present

## 2022-05-22 DIAGNOSIS — E781 Pure hyperglyceridemia: Secondary | ICD-10-CM | POA: Diagnosis not present

## 2022-05-22 DIAGNOSIS — Z79899 Other long term (current) drug therapy: Secondary | ICD-10-CM | POA: Diagnosis not present

## 2022-05-22 DIAGNOSIS — Z7984 Long term (current) use of oral hypoglycemic drugs: Secondary | ICD-10-CM | POA: Diagnosis not present

## 2022-05-22 DIAGNOSIS — D125 Benign neoplasm of sigmoid colon: Secondary | ICD-10-CM | POA: Diagnosis not present

## 2022-05-22 DIAGNOSIS — K635 Polyp of colon: Secondary | ICD-10-CM | POA: Diagnosis not present

## 2022-05-22 DIAGNOSIS — Z8572 Personal history of non-Hodgkin lymphomas: Secondary | ICD-10-CM | POA: Diagnosis not present

## 2022-05-22 DIAGNOSIS — Z980 Intestinal bypass and anastomosis status: Secondary | ICD-10-CM | POA: Diagnosis not present

## 2022-05-22 DIAGNOSIS — Z96652 Presence of left artificial knee joint: Secondary | ICD-10-CM | POA: Diagnosis not present

## 2022-05-22 DIAGNOSIS — Z9484 Stem cells transplant status: Secondary | ICD-10-CM | POA: Diagnosis not present

## 2022-05-22 DIAGNOSIS — K5730 Diverticulosis of large intestine without perforation or abscess without bleeding: Secondary | ICD-10-CM | POA: Diagnosis not present

## 2022-05-22 DIAGNOSIS — K5792 Diverticulitis of intestine, part unspecified, without perforation or abscess without bleeding: Secondary | ICD-10-CM | POA: Diagnosis not present

## 2022-05-22 DIAGNOSIS — Z9889 Other specified postprocedural states: Secondary | ICD-10-CM | POA: Diagnosis not present

## 2022-05-22 DIAGNOSIS — Z8719 Personal history of other diseases of the digestive system: Secondary | ICD-10-CM | POA: Diagnosis not present

## 2022-05-22 DIAGNOSIS — E039 Hypothyroidism, unspecified: Secondary | ICD-10-CM | POA: Diagnosis not present

## 2022-05-22 DIAGNOSIS — K648 Other hemorrhoids: Secondary | ICD-10-CM | POA: Diagnosis not present

## 2022-05-22 DIAGNOSIS — I10 Essential (primary) hypertension: Secondary | ICD-10-CM | POA: Diagnosis not present

## 2022-05-22 DIAGNOSIS — D122 Benign neoplasm of ascending colon: Secondary | ICD-10-CM | POA: Diagnosis not present

## 2022-05-22 DIAGNOSIS — D124 Benign neoplasm of descending colon: Secondary | ICD-10-CM | POA: Diagnosis not present

## 2022-05-22 DIAGNOSIS — Z800 Family history of malignant neoplasm of digestive organs: Secondary | ICD-10-CM | POA: Diagnosis not present

## 2022-05-22 DIAGNOSIS — Z1211 Encounter for screening for malignant neoplasm of colon: Secondary | ICD-10-CM | POA: Diagnosis not present

## 2022-05-22 DIAGNOSIS — D128 Benign neoplasm of rectum: Secondary | ICD-10-CM | POA: Diagnosis not present

## 2022-05-22 DIAGNOSIS — E785 Hyperlipidemia, unspecified: Secondary | ICD-10-CM | POA: Diagnosis not present

## 2022-06-22 ENCOUNTER — Encounter: Admit: 2022-06-22 | Payer: PRIVATE HEALTH INSURANCE | Attending: Internal Medicine | Primary: Internal Medicine

## 2022-06-22 MED ORDER — ATORVASTATIN 10 MG TABLET
10 | ORAL_TABLET | Freq: Every day | ORAL | 3 refills | 90.00000 days | Status: AC
Start: 2022-06-22 — End: 2022-09-18

## 2022-07-04 DIAGNOSIS — M1711 Unilateral primary osteoarthritis, right knee: Secondary | ICD-10-CM | POA: Diagnosis not present

## 2022-07-04 DIAGNOSIS — M25562 Pain in left knee: Secondary | ICD-10-CM | POA: Diagnosis not present

## 2022-07-11 ENCOUNTER — Encounter: Admit: 2022-07-11 | Payer: PRIVATE HEALTH INSURANCE | Attending: Internal Medicine | Primary: Internal Medicine

## 2022-07-11 DIAGNOSIS — M170 Bilateral primary osteoarthritis of knee: Secondary | ICD-10-CM | POA: Diagnosis not present

## 2022-07-11 MED ORDER — ALPRAZOLAM 0.5 MG TABLET
0.5 | ORAL_TABLET | Freq: Every evening | ORAL | 2 refills | 25.00000 days | Status: AC | PRN
Start: 2022-07-11 — End: 2023-01-15

## 2022-07-17 DIAGNOSIS — U071 COVID-19: Secondary | ICD-10-CM | POA: Diagnosis not present

## 2022-07-18 ENCOUNTER — Encounter: Admit: 2022-07-18 | Payer: PRIVATE HEALTH INSURANCE | Attending: Internal Medicine | Primary: Internal Medicine

## 2022-07-25 ENCOUNTER — Encounter: Admit: 2022-07-25 | Payer: PRIVATE HEALTH INSURANCE | Attending: Family | Primary: Internal Medicine

## 2022-07-25 MED ORDER — FLUTICASONE PROPIONATE 50 MCG/ACTUATION NASAL SPRAY,SUSPENSION
50 | Freq: Every day | NASAL | 4 refills | 30.00000 days | Status: AC
Start: 2022-07-25 — End: 2022-08-18

## 2022-07-26 DIAGNOSIS — U099 Post covid-19 condition, unspecified: Secondary | ICD-10-CM | POA: Diagnosis not present

## 2022-07-26 DIAGNOSIS — Z6836 Body mass index (BMI) 36.0-36.9, adult: Secondary | ICD-10-CM | POA: Diagnosis not present

## 2022-07-26 DIAGNOSIS — E6609 Other obesity due to excess calories: Secondary | ICD-10-CM | POA: Diagnosis not present

## 2022-07-26 DIAGNOSIS — J329 Chronic sinusitis, unspecified: Secondary | ICD-10-CM | POA: Diagnosis not present

## 2022-07-27 DIAGNOSIS — Z23 Encounter for immunization: Secondary | ICD-10-CM | POA: Diagnosis not present

## 2022-08-14 ENCOUNTER — Ambulatory Visit: Payer: Self-pay

## 2022-08-15 DIAGNOSIS — M7581 Other shoulder lesions, right shoulder: Secondary | ICD-10-CM | POA: Diagnosis not present

## 2022-08-15 DIAGNOSIS — M47812 Spondylosis without myelopathy or radiculopathy, cervical region: Secondary | ICD-10-CM | POA: Diagnosis not present

## 2022-08-17 ENCOUNTER — Encounter: Admit: 2022-08-17 | Payer: PRIVATE HEALTH INSURANCE | Attending: Family | Primary: Internal Medicine

## 2022-08-18 MED ORDER — FLUTICASONE PROPIONATE 50 MCG/ACTUATION NASAL SPRAY,SUSPENSION
50 | Freq: Every day | NASAL | 1 refills | 30.00000 days | Status: AC
Start: 2022-08-18 — End: 2023-04-19

## 2022-08-21 ENCOUNTER — Encounter: Admit: 2022-08-21 | Payer: Managed Care (Private) | Attending: Internal Medicine | Primary: Internal Medicine

## 2022-08-21 ENCOUNTER — Encounter: Admit: 2022-08-21 | Payer: PRIVATE HEALTH INSURANCE | Attending: Internal Medicine | Primary: Internal Medicine

## 2022-08-21 DIAGNOSIS — M1991 Primary osteoarthritis, unspecified site: Secondary | ICD-10-CM | POA: Diagnosis not present

## 2022-08-21 DIAGNOSIS — C8170 Other classical Hodgkin lymphoma, unspecified site: Secondary | ICD-10-CM | POA: Diagnosis not present

## 2022-08-21 DIAGNOSIS — E890 Postprocedural hypothyroidism: Secondary | ICD-10-CM | POA: Diagnosis not present

## 2022-08-21 DIAGNOSIS — I10 Essential (primary) hypertension: Secondary | ICD-10-CM | POA: Diagnosis not present

## 2022-08-21 DIAGNOSIS — Z0001 Encounter for general adult medical examination with abnormal findings: Secondary | ICD-10-CM | POA: Diagnosis not present

## 2022-08-21 DIAGNOSIS — E119 Type 2 diabetes mellitus without complications: Secondary | ICD-10-CM | POA: Diagnosis not present

## 2022-08-21 DIAGNOSIS — E782 Mixed hyperlipidemia: Secondary | ICD-10-CM | POA: Diagnosis not present

## 2022-08-21 DIAGNOSIS — F419 Anxiety disorder, unspecified: Secondary | ICD-10-CM | POA: Diagnosis not present

## 2022-08-21 DIAGNOSIS — Z8571 Personal history of Hodgkin lymphoma: Secondary | ICD-10-CM | POA: Diagnosis not present

## 2022-08-21 DIAGNOSIS — Z6836 Body mass index (BMI) 36.0-36.9, adult: Secondary | ICD-10-CM | POA: Diagnosis not present

## 2022-08-21 DIAGNOSIS — E6609 Other obesity due to excess calories: Secondary | ICD-10-CM | POA: Diagnosis not present

## 2022-08-21 DIAGNOSIS — J45909 Unspecified asthma, uncomplicated: Secondary | ICD-10-CM | POA: Diagnosis not present

## 2022-08-21 DIAGNOSIS — E7849 Other hyperlipidemia: Secondary | ICD-10-CM | POA: Diagnosis not present

## 2022-08-21 MED ORDER — MONTELUKAST 10 MG TABLET
10 | ORAL_TABLET | Freq: Every evening | ORAL | 2 refills | 90.00000 days | Status: AC
Start: 2022-08-21 — End: 2022-09-13

## 2022-08-22 ENCOUNTER — Telehealth: Admit: 2022-08-22 | Payer: PRIVATE HEALTH INSURANCE | Attending: Internal Medicine | Primary: Internal Medicine

## 2022-08-22 MED ORDER — PROMETHAZINE-DM 6.25 MG-15 MG/5 ML ORAL SYRUP
6.25-15 | Freq: Four times a day (QID) | ORAL | 2 refills | 7.50000 days | Status: AC | PRN
Start: 2022-08-22 — End: 2023-04-19

## 2022-08-30 ENCOUNTER — Ambulatory Visit: Admit: 2022-08-30 | Payer: Managed Care (Private) | Primary: Internal Medicine

## 2022-09-07 ENCOUNTER — Encounter: Admit: 2022-09-07 | Payer: Managed Care (Private) | Primary: Internal Medicine

## 2022-09-12 ENCOUNTER — Encounter: Admit: 2022-09-12 | Payer: PRIVATE HEALTH INSURANCE | Attending: Internal Medicine | Primary: Internal Medicine

## 2022-09-13 MED ORDER — MONTELUKAST 10 MG TABLET
10 | ORAL_TABLET | Freq: Every evening | ORAL | 1 refills | 90.00000 days | Status: AC
Start: 2022-09-13 — End: 2022-12-06

## 2022-09-14 ENCOUNTER — Inpatient Hospital Stay: Admit: 2022-09-14 | Discharge: 2022-09-14 | Payer: Managed Care (Private) | Primary: Internal Medicine

## 2022-09-14 ENCOUNTER — Inpatient Hospital Stay: Admit: 2022-09-14 | Discharge: 2022-09-14 | Payer: PRIVATE HEALTH INSURANCE | Primary: Internal Medicine

## 2022-09-14 ENCOUNTER — Encounter: Admit: 2022-09-14 | Payer: PRIVATE HEALTH INSURANCE | Primary: Internal Medicine

## 2022-09-14 MED ORDER — SODIUM CHLORIDE 0.9 % IV BOLUS FROM BAG (ZERO CHARGE)
0.9 | INTRAVENOUS | Status: DC
Start: 2022-09-14 — End: 2022-09-16

## 2022-09-14 MED ORDER — SODIUM CHLORIDE 0.9 % INTRAVENOUS SOLUTION
INTRAVENOUS | Status: DC
Start: 2022-09-14 — End: 2022-09-16
  Administered 2022-09-14: 08:00:00 via INTRAVENOUS

## 2022-09-18 ENCOUNTER — Encounter: Admit: 2022-09-18 | Payer: PRIVATE HEALTH INSURANCE | Attending: Internal Medicine | Primary: Internal Medicine

## 2022-09-18 MED ORDER — ATORVASTATIN 10 MG TABLET
10 | ORAL_TABLET | Freq: Every day | ORAL | 3 refills | 90.00000 days | Status: AC
Start: 2022-09-18 — End: 2023-06-21

## 2022-09-19 ENCOUNTER — Encounter: Admit: 2022-09-19 | Payer: PRIVATE HEALTH INSURANCE | Attending: Gastroenterology | Primary: Internal Medicine

## 2022-10-11 ENCOUNTER — Telehealth: Admit: 2022-10-11 | Payer: PRIVATE HEALTH INSURANCE | Attending: Internal Medicine | Primary: Internal Medicine

## 2022-10-16 DIAGNOSIS — J069 Acute upper respiratory infection, unspecified: Secondary | ICD-10-CM | POA: Diagnosis not present

## 2022-10-18 DIAGNOSIS — M1711 Unilateral primary osteoarthritis, right knee: Secondary | ICD-10-CM | POA: Diagnosis not present

## 2022-10-18 NOTE — H&P (Signed)
KNEE ARTHROPLASTY ADMISSION H&P  Patient ID: [name] MRN: [id number] DOB/AGE: 1960-12-24 61 y.o.  Chief Complaint: right knee pain.  Planned Procedure Date: 11/06/32 Medical and Cardiac Clearance by Dr. Hilma Favors     HPI: [name] is a 61 y.o. female who presents for evaluation of OA RIGHT KNEE. The patient has a history of pain and functional disability in the right knee due to arthritis and has failed non-surgical conservative treatments for greater than 12 weeks to include NSAID's and/or analgesics, corticosteriod injections, viscosupplementation injections, use of assistive devices, and activity modification.  Onset of symptoms was gradual, starting 6 years ago with gradually worsening course since that time. The patient noted no past surgery on the right knee.  Patient currently rates pain at 10 out of 10 with activity. Patient has night pain, worsening of pain with activity and weight bearing, and pain that interferes with activities of daily living.  Patient has evidence of subchondral sclerosis, periarticular osteophytes, and joint space narrowing by imaging studies.  There is no active infection.  Past Medical History:  Diagnosis Date   Anxiety    Cancer ([place])    hodgkins lymphoma, 1992 Diagnosed   H/O radioactive iodine thyroid ablation 1980's   Heart murmur    History of kidney stones    Hypertension    Hypothyroidism    "thyroid is dead"   Primary localized osteoarthritis of left knee    S/P total knee replacement using cement, left 03/19/2017   Past Surgical History:  Procedure Laterality Date   AXILLARY LYMPH NODE DISSECTION Right 1992   BONE MARROW TRANSPLANT     COLON RESECTION SIGMOID N/A 03/17/2017   Procedure: COLON RESECTION SIGMOID AND CREATION OF COLOSTOMY;  Surgeon: Donnie Mesa, MD;  Location: [facility];  Service: General;  Laterality: N/A;   COLONOSCOPY N/A 09/07/2015   Procedure: COLONOSCOPY;  Surgeon: Aviva Signs, MD;  [facility];  Service:  Gastroenterology;  Laterality: N/A;   COLOSTOMY REVERSAL N/A 08/02/2017   Procedure: COLOSTOMY REVERSAL;  Surgeon: Donnie Mesa, MD;  Location: [facility];  Service: General;  Laterality: N/A;   [place] ARTHROPLASTY Left 03/12/2017   Procedure: LEFT TOTAL KNEE ARTHROPLASTY;  Surgeon: Elsie Saas, MD;  Location: [facility];  Service: Orthopedics;  Laterality: Left;   Allergies  Allergen Reactions   Cephalexin Rash   Contrast Media [Iodinated Contrast Media] Nausea And Vomiting   Iodine-131 Nausea And Vomiting   Prior to Admission medications   Medication Sig Start Date End Date Taking? Authorizing Provider  acetaminophen (TYLENOL) 325 MG tablet Take 1-2 tablets (325-650 mg total) by mouth every 6 (six) hours as needed for mild pain. 03/30/17   Love, Ivan Anchors, PA-C  albuterol (PROVENTIL HFA;VENTOLIN HFA) 108 (90 Base) MCG/ACT inhaler Inhale 2 puffs into the lungs every 4 (four) hours as needed for wheezing or shortness of breath. 07/22/17   Fransico Meadow, PA-C  amLODipine (NORVASC) 10 MG tablet Take 1 tablet (10 mg total) by mouth every evening. 03/30/17   Love, Ivan Anchors, PA-C  amoxicillin-clavulanate (AUGMENTIN) 875-125 MG tablet Take 1 tablet by mouth every 12 (twelve) hours. 07/02/18   Rancour, Annie Main, MD  aspirin EC 81 MG tablet Take 81 mg by mouth daily.    [provider]  benazepril (LOTENSIN) 20 MG tablet Take 1 tablet (20 mg total) by mouth daily. Patient not taking: Reported on 07/19/2017 03/30/17   Reesa Chew  S, PA-C  benazepril-hydrochlorthiazide (LOTENSIN HCT) 20-25 MG tablet Take 1 tablet by mouth daily. 06/05/17   [provider]  cetirizine (ZYRTEC) 10 MG tablet Take 10 mg by mouth daily.    [provider]  diazepam (VALIUM) 2 MG tablet Take 2 mg by mouth as needed. 05/21/17   [provider]  diphenhydrAMINE (BENADRYL) 25 mg capsule Take 25 mg by mouth at bedtime.    [provider]   doxycycline (VIBRAMYCIN) 100 MG capsule Take 1 capsule (100 mg total) by mouth 2 (two) times daily. 07/02/18   Rancour, Annie Main, MD  iron polysaccharides (NIFEREX) 150 MG capsule Take 1 capsule (150 mg total) by mouth 2 (two) times daily before lunch and supper. Patient not taking: Reported on 07/19/2017 03/30/17   Love, Ivan Anchors, PA-C  levothyroxine (SYNTHROID, LEVOTHROID) 88 MCG tablet Take 88 mcg by mouth daily before breakfast.    [provider]  methocarbamol (ROBAXIN) 500 MG tablet Take 0.5-1 tablets (250-500 mg total) by mouth every 8 (eight) hours as needed for muscle spasms. Patient not taking: Reported on 07/19/2017 03/30/17   Flora Lipps  [facility] 10 MG/0.5ML Pen SMARTSIG:10 Milligram(s) SUB-Q Once a Week 03/31/22   [provider]  multivitamin (PROSIGHT) TABS tablet Take 1 tablet by mouth daily. 03/31/17   Love, Ivan Anchors, PA-C  ofloxacin (OCUFLOX) 0.3 % ophthalmic solution Place 1 drop into both eyes 4 (four) times daily. 04/02/22   Volney American, PA-C  ondansetron (ZOFRAN-ODT) 4 MG disintegrating tablet Take 1 tablet (4 mg total) by mouth every 6 (six) hours as needed for nausea. Patient not taking: Reported on 07/19/2017 03/30/17   Love, Ivan Anchors, PA-C  oxyCODONE (OXY IR/ROXICODONE) 5 MG immediate release tablet Take 1 tablet (5 mg total) by mouth every 4 (four) hours as needed for moderate pain. 08/06/17   Donnie Mesa, MD  pravastatin (PRAVACHOL) 20 MG tablet Take 1 tablet (20 mg total) by mouth every evening. 03/30/17   Love, Ivan Anchors, PA-C  Probiotic Product (PHILLIPS COLON HEALTH PO) Take 1 tablet by mouth daily.    [provider]   Social History   Socioeconomic History   Marital status: Married    Spouse name: Not on file   Number of children: Not on file   Years of education: Not on file   Highest education level: Not on file  Occupational History   Not on file  Tobacco Use   Smoking status: Former    Types: Cigarettes    Quit date:  05/08/1990    Years since quitting: 32.4   Smokeless tobacco: Never  Vaping Use   Vaping Use: Never used  Substance and Sexual Activity   Alcohol use: No   Drug use: No   Sexual activity: Not Currently    Birth control/protection: None  Other Topics Concern   Not on file  Social History Narrative   Not on file   Social Determinants of Health   Financial Resource Strain: Not on file  Food Insecurity: Not on file  Transportation Needs: Not on file  Physical Activity: Not on file  Stress: Not on file  Social Connections: Not on file   Family History  Problem Relation Age of Onset   Breast cancer Mother    Diabetes Father     ROS: Currently denies lightheadedness, dizziness, Fever, chills, CP, SOB.   No personal history of DVT, PE, MI, or CVA. No loose teeth + for partial dentures All other systems  have been reviewed and were otherwise currently negative with the exception of those mentioned in the HPI and as above.  Objective: Vitals: Ht: 5'3" Wt: 216 lbs Temp: 97.9 BP: 122/73 Pulse: 95 O2 97% on room air.   Physical Exam: General: Alert, NAD.  Antalgic Gait  HEENT: EOMI, Good Neck Extension  Pulm: No increased work of breathing.  Clear B/L A/P w/o crackle or wheeze.  CV: RRR, No m/g/r appreciated  GI: soft, NT, ND. BS x 4 quadrants Neuro: CN II-XII grossly intact without focal deficit.  Sensation intact distally Skin: No lesions in the area of chief complaint MSK/Surgical Site:   + JLT. ROM full but slow.  Decreased strength in extension and flexion.  +EHL/FHL.  NVI.  Pain and instability with varus and valgus stress.    Imaging Review Plain radiographs demonstrate severe degenerative joint disease of the right knee.   The overall alignment issignificant varus. The bone quality appears to be fair for age and reported activity level.  Preoperative templating of the joint replacement has been completed, documented, and submitted to the Operating Room personnel in  order to optimize intra-operative equipment management.  Assessment: OA RIGHT KNEE Active Problems:   * No active hospital problems. *   Plan: Plan for Procedure(s): TOTAL KNEE ARTHROPLASTY  The patient history, physical exam, clinical judgement of the provider and imaging are consistent with end stage degenerative joint disease and total joint arthroplasty is deemed medically necessary. The treatment options including medical management, injection therapy, and arthroplasty were discussed at length. The risks and benefits of Procedure(s): TOTAL KNEE ARTHROPLASTY were presented and reviewed.  The risks of nonoperative treatment, versus surgical intervention including but not limited to continued pain, aseptic loosening, stiffness, dislocation/subluxation, infection, bleeding, nerve injury, blood clots, cardiopulmonary complications, morbidity, mortality, among others were discussed. The patient verbalizes understanding and wishes to proceed with the plan.  Patient is being admitted for inpatient treatment for surgery, pain control, PT, prophylactic antibiotics, VTE prophylaxis, progressive ambulation, ADL's and discharge planning.   Dental prophylaxis discussed and recommended for 2 years postoperatively.  The patient does meet the criteria for TXA which will be used perioperatively.   ASA 81 mg BID will be used postoperatively for DVT prophylaxis in addition to SCDs, and early ambulation. Plan for Tylenol, Diclofenac, oxycodone for pain.   Zofran for nausea and vomiting. Miralax for constipation prevention [place] The patient is planning to be discharged home with OPPT and into the care of her husband Patrick Jupiter who can be reached at [phone] and her daughter Andy Gauss who can be reached at [phone]. Follow up appt 11/21/22 at 2:30pm     Alisa Graff Office [phone] 10/18/2022 7:23 PM

## 2022-10-18 NOTE — Progress Notes (Signed)
Sent message, via epic in basket, requesting orders in epic from surgeon.  

## 2022-10-20 NOTE — Patient Instructions (Signed)
SURGICAL WAITING ROOM VISITATION  Patients having surgery or a procedure may have no more than 2 support people in the waiting area - these visitors may rotate.    Children under the age of 79 must have an adult with them who is not the patient.  Due to an increase in RSV and influenza rates and associated hospitalizations, children ages 64 and under may not visit patients in [place].  If the patient needs to stay at the hospital during part of their recovery, the visitor guidelines for inpatient rooms apply. Pre-op nurse will coordinate an appropriate time for 1 support person to accompany patient in pre-op.  This support person may not rotate.    Please refer to the [facility] website for the visitor guidelines for Inpatients (after your surgery is over and you are in a regular room).    Your procedure is scheduled on: 11/06/22   Report to [facility] Main Entrance    Report to admitting at 7:45 AM   Call this number if you have problems the morning of surgery [phone]   Do not eat food :After Midnight.   After Midnight you may have the following liquids until 7:15 AM DAY OF SURGERY  Water Non-Citrus Juices (without pulp, NO RED-Apple, White grape, White cranberry) Black Coffee (NO MILK/CREAM OR CREAMERS, sugar ok)  Clear Tea (NO MILK/CREAM OR CREAMERS, sugar ok) regular and decaf                             Plain Jell-O (NO RED)                                           Fruit ices (not with fruit pulp, NO RED)                                     Popsicles (NO RED)                                                               Sports drinks like Gatorade (NO RED)                 The day of surgery:  Drink ONE (1) Pre-Surgery Clear Ensure at 7:15 AM the morning of surgery. Drink in one sitting. Do not sip.  This drink was given to you during your hospital  pre-op appointment visit. Nothing else to drink after completing the  Pre-Surgery Clear Ensure.           If you have questions, please contact your surgeon's office.   FOLLOW BOWEL PREP AND ANY ADDITIONAL PRE OP INSTRUCTIONS YOU RECEIVED FROM YOUR SURGEON'S OFFICE!!!     Oral Hygiene is also important to reduce your risk of infection.                                    Remember - BRUSH YOUR TEETH THE MORNING OF SURGERY WITH YOUR REGULAR TOOTHPASTE  DENTURES WILL  BE REMOVED PRIOR TO SURGERY PLEASE DO NOT APPLY "Poly grip" OR ADHESIVES!!!   Take these medicines the morning of surgery with A SIP OF WATER: Tylenol, Inhalers, Allegra, Levothyroxine                              You may not have any metal on your body including hair pins, jewelry, and body piercing             Do not wear make-up, lotions, powders, perfumes, or deodorant  Do not wear nail polish including gel and S&S, artificial/acrylic nails, or any other type of covering on natural nails including finger and toenails. If you have artificial nails, gel coating, etc. that needs to be removed by a nail salon please have this removed prior to surgery or surgery may need to be canceled/ delayed if the surgeon/ anesthesia feels like they are unable to be safely monitored.   Do not shave  48 hours prior to surgery.    Do not bring valuables to the hospital. [place].  DO NOT [place]. PHARMACY WILL DISPENSE MEDICATIONS LISTED ON YOUR MEDICATION LIST TO YOU DURING YOUR ADMISSION [place]!    Patients discharged on the day of surgery will not be allowed to drive home.  Someone NEEDS to stay with you for the first 24 hours after anesthesia.              Please read over the following fact sheets you were given: IF [place] [phone] Schneiders    If you received a COVID test during your pre-op visit  it is requested that you wear a mask when out in public, stay away from anyone that  may not be feeling well and notify your surgeon if you develop symptoms. If you test positive for Covid or have been in contact with anyone that has tested positive in the last 10 days please notify you surgeon.    [place] - Preparing for Surgery Before surgery, you can play an important role.  Because skin is not sterile, your skin needs to be as free of germs as possible.  You can reduce the number of germs on your skin by washing with CHG (chlorahexidine gluconate) soap before surgery.  CHG is an antiseptic cleaner which kills germs and bonds with the skin to continue killing germs even after washing. Please DO NOT use if you have an allergy to CHG or antibacterial soaps.  If your skin becomes reddened/irritated stop using the CHG and inform your nurse when you arrive at Short Stay. Do not shave (including legs and underarms) for at least 48 hours prior to the first CHG shower.  You may shave your face/neck.  Please follow these instructions carefully:  1.  Shower with CHG Soap the night before surgery and the  morning of surgery.  2.  If you choose to wash your hair, wash your hair first as usual with your normal  shampoo.  3.  After you shampoo, rinse your hair and body thoroughly to remove the shampoo.                             4.  Use CHG as you  would any other liquid soap.  You can apply chg directly to the skin and wash.  Gently with a scrungie or clean washcloth.  5.  Apply the CHG Soap to your body ONLY FROM THE NECK DOWN.   Do   not use on face/ open                           Wound or open sores. Avoid contact with eyes, ears mouth and   genitals (private parts).                       Wash face,  Genitals (private parts) with your normal soap.             6.  Wash thoroughly, paying special attention to the area where your    surgery  will be performed.  7.  Thoroughly rinse your body with warm water from the neck down.  8.  DO NOT shower/wash with your normal soap after using and  rinsing off the CHG Soap.                9.  Pat yourself dry with a clean towel.            10.  Wear clean pajamas.            11.  Place clean sheets on your bed the night of your first shower and do not  sleep with pets. Day of Surgery : Do not apply any lotions/deodorants the morning of surgery.  Please wear clean clothes to the hospital/surgery center.  FAILURE TO FOLLOW THESE INSTRUCTIONS MAY RESULT IN THE CANCELLATION OF YOUR SURGERY  PATIENT SIGNATURE_________________________________  NURSE SIGNATURE__________________________________  ________________________________________________________________________  Adam Phenix  An incentive spirometer is a tool that can help keep your lungs clear and active. This tool measures how well you are filling your lungs with each breath. Taking long deep breaths may help reverse or decrease the chance of developing breathing (pulmonary) problems (especially infection) following: A long period of time when you are unable to move or be active. BEFORE THE PROCEDURE  If the spirometer includes an indicator to show your best effort, your nurse or respiratory therapist will set it to a desired goal. If possible, sit up straight or lean slightly forward. Try not to slouch. Hold the incentive spirometer in an upright position. INSTRUCTIONS FOR USE  Sit on the edge of your bed if possible, or sit up as far as you can in bed or on a chair. Hold the incentive spirometer in an upright position. Breathe out normally. Place the mouthpiece in your mouth and seal your lips tightly around it. Breathe in slowly and as deeply as possible, raising the piston or the ball toward the top of the column. Hold your breath for 3-5 seconds or for as long as possible. Allow the piston or ball to fall to the bottom of the column. Remove the mouthpiece from your mouth and breathe out normally. Rest for a few seconds and repeat Steps 1 through 7 at least 10 times  every 1-2 hours when you are awake. Take your time and take a few normal breaths between deep breaths. The spirometer may include an indicator to show your best effort. Use the indicator as a goal to work toward during each repetition. After each set of 10 deep breaths, practice coughing to be sure your lungs are clear. If you have  an incision (the cut made at the time of surgery), support your incision when coughing by placing a pillow or rolled up towels firmly against it. Once you are able to get out of bed, walk around indoors and cough well. You may stop using the incentive spirometer when instructed by your caregiver.  RISKS AND COMPLICATIONS Take your time so you do not get dizzy or light-headed. If you are in pain, you may need to take or ask for pain medication before doing incentive spirometry. It is harder to take a deep breath if you are having pain. AFTER USE Rest and breathe slowly and easily. It can be helpful to keep track of a log of your progress. Your caregiver can provide you with a simple table to help with this. If you are using the spirometer at home, follow these instructions: [place] IF:  You are having difficultly using the spirometer. You have trouble using the spirometer as often as instructed. Your pain medication is not giving enough relief while using the spirometer. You develop fever of 100.5 F (38.1 C) or higher. SEEK IMMEDIATE MEDICAL CARE IF:  You cough up bloody sputum that had not been present before. You develop fever of 102 F (38.9 C) or greater. You develop worsening pain at or near the incision site. MAKE SURE YOU:  Understand these instructions. Will watch your condition. Will get help right away if you are not doing well or get worse. Document Released: 02/26/2007 Document Revised: 01/08/2012 Document Reviewed: 04/29/2007 [facility] Patient Information 2014 [place],  [place].   ________________________________________________________________________

## 2022-10-20 NOTE — Progress Notes (Addendum)
COVID Vaccine Completed: yes  Date of COVID positive in last 90 days: no  PCP - Assunta Found, MD Cardiologist - n/a  Chest x-ray - n/a EKG - 10/25/22 Epic/chart Stress Test - 02/15/17 Epic ECHO - 02/15/17 Epic Cardiac Cath - n/a Pacemaker/ICD device last checked: n/a Spinal Cord Stimulator: n/a  Bowel Prep - no  Sleep Study - n/a CPAP -   Fasting Blood Sugar - n/a Checks Blood Sugar _____ times a day  Last dose of GLP1 agonist-  N/A GLP1 instructions:  N/A   Last dose of SGLT-2 inhibitors-  N/A SGLT-2 instructions: N/A   Blood Thinner Instructions: Aspirin Instructions: ASA 81, hold 7 days before Last Dose:  Activity level: Can go up a flight of stairs and perform activities of daily living without stopping and without symptoms of chest pain or shortness of breath.    Anesthesia review: heart murmur, HTN, non hodgkin lymphoma   Patient denies shortness of breath, fever, cough and chest pain at PAT appointment  Patient verbalized understanding of instructions that were given to them at the PAT appointment. Patient was also instructed that they will need to review over the PAT instructions again at home before surgery.

## 2022-10-25 ENCOUNTER — Encounter (HOSPITAL_COMMUNITY)
Admission: RE | Admit: 2022-10-25 | Discharge: 2022-10-25 | Disposition: A | Discharge status: Home / Self Care | Payer: BC Managed Care – PPO | Source: Ambulatory Visit | Attending: Orthopedic Surgery | Admitting: Orthopedic Surgery

## 2022-10-25 ENCOUNTER — Encounter (HOSPITAL_COMMUNITY): Payer: Self-pay

## 2022-10-25 VITALS — BP 145/78 | HR 99 | Temp 98.6°F | Resp 16 | Ht 64.0 in | Wt 212.4 lb

## 2022-10-25 DIAGNOSIS — Z01818 Encounter for other preprocedural examination: Secondary | ICD-10-CM | POA: Diagnosis not present

## 2022-10-25 DIAGNOSIS — I10 Essential (primary) hypertension: Secondary | ICD-10-CM | POA: Diagnosis not present

## 2022-10-25 HISTORY — DX: Pneumonia, unspecified organism: J18.9

## 2022-10-25 LAB — BASIC METABOLIC PANEL
Anion gap: 7 (ref 5–15)
BUN: 25 mg/dL — ABNORMAL HIGH (ref 8–23)
CO2: 24 mmol/L (ref 22–32)
Calcium: 9.8 mg/dL (ref 8.9–10.3)
Chloride: 107 mmol/L (ref 98–111)
Creatinine, Ser: 0.83 mg/dL (ref 0.44–1.00)
GFR, Estimated: >60 mL/min (ref >60)
Glucose, Bld: 102 mg/dL — ABNORMAL HIGH (ref 70–99)
Potassium: 4.2 mmol/L (ref 3.5–5.1)
Sodium: 138 mmol/L (ref 135–145)

## 2022-10-25 LAB — CBC
HCT: 42.2 % (ref 36.0–46.0)
Hemoglobin: 13.6 g/dL (ref 12.0–15.0)
MCH: 31.6 pg (ref 26.0–34.0)
MCHC: 32.2 g/dL (ref 30.0–36.0)
MCV: 98.1 fL (ref 80.0–100.0)
Platelets: 222 10*3/uL (ref 150–400)
RBC: 4.3 MIL/uL (ref 3.87–5.11)
RDW: 16 % — ABNORMAL HIGH (ref 11.5–15.5)
WBC: 7.7 10*3/uL (ref 4.0–10.5)
nRBC: 0 % (ref 0.0–0.2)

## 2022-10-25 LAB — SURGICAL PCR SCREEN
MRSA, PCR: NEGATIVE
Staphylococcus aureus: NEGATIVE

## 2022-10-26 NOTE — Progress Notes (Signed)
Left voicemail with Dr. Zachery Dakins scheduler Claiborne Billings) about patient ambulatory status as patient states she believed she was spending  the night.

## 2022-10-27 ENCOUNTER — Encounter: Admit: 2022-10-27 | Payer: PRIVATE HEALTH INSURANCE | Attending: Internal Medicine | Primary: Internal Medicine

## 2022-10-27 ENCOUNTER — Encounter: Admit: 2022-10-27 | Payer: PRIVATE HEALTH INSURANCE | Attending: Nephrology | Primary: Internal Medicine

## 2022-10-27 ENCOUNTER — Telehealth: Admit: 2022-10-27 | Payer: PRIVATE HEALTH INSURANCE | Attending: Internal Medicine | Primary: Internal Medicine

## 2022-10-27 MED ORDER — NIRMATRELVIR 300 MG (150 MG X2)-RITONAVIR 100 MG TABLET,DOSE PACK
300 | ORAL_TABLET | ORAL | 1 refills | 5.00000 days | Status: AC
Start: 2022-10-27 — End: ?

## 2022-10-31 ENCOUNTER — Telehealth: Admit: 2022-10-31 | Payer: PRIVATE HEALTH INSURANCE | Primary: Internal Medicine

## 2022-10-31 NOTE — Progress Notes
COVID-19 Occupational Health Assessment  Flowsheet Row Patient Specific Responses COVID-19 Occupational Health Intake  Hospital Affliliation: [facility] Dept/Unit: [facility] Job Title: reception Verified phone number listed is correct.  Updated in demographics if not correct. Yes What side effects could potentially be due to the COVID-19 VACCINE or due to COVID-19 INFECTION, Influenza INFECTION, or RSV INFECTION are present?  Body Aches (Muscle Pain/Joint Pain), Fatigue, Fever (> 100.12F) Temperature: 102 F (38.9 C) What possible symptoms of COVID-19 INFECTION, Influenza INFECTION, or RSV INFECTION are present but not likely due to a COVID-19 VACCINE are present?  Nasal congestion and/or rhinitis, Cough Date of Onset of Symptoms: 10/27/22 New COVID-19 Testing Indicated? No Has the employee had a COVID-19 test? Yes Most Recent COVID-19 Test Result: Positive Return to work now?   (This field must be filled out with an associated date EVERY time) No Out of work due to: Symptomatic Employee Expected out of work until (if NOT cleared to RTW) 11/03/22 Home Isolation Recommended? Yes Recommended self monitoring with temperature checks twice a day? Yes

## 2022-11-04 ENCOUNTER — Telehealth: Admit: 2022-11-04 | Payer: PRIVATE HEALTH INSURANCE | Primary: Internal Medicine

## 2022-11-04 NOTE — Progress Notes
COVID-19 Occupational Health Assessment  Flowsheet Row Patient Specific Responses COVID-19 Occupational Health Intake  Hospital Affliliation: [facility] Return to work now?   (This field must be filled out with an associated date EVERY time) Yes Final Return to Work Date (if cleared to RTW) 11/03/22 Additional Comments: Presumptive RTW letter sent

## 2022-11-05 NOTE — Anesthesia Preprocedure Evaluation (Signed)
Anesthesia Evaluation  Patient identified by MRN, date of birth, ID band Patient awake    Reviewed: Allergy & Precautions, NPO status , Patient's Chart, lab work & pertinent test results  Airway Mallampati: II  TM Distance: >3 FB Neck ROM: Full    Dental  (+) Dental Advisory Given, Edentulous Upper, Missing   Pulmonary former smoker   Pulmonary exam normal breath sounds clear to auscultation       Cardiovascular hypertension, Pt. on medications Normal cardiovascular exam+ Valvular Problems/Murmurs MR  Rhythm:Regular Rate:Normal     Neuro/Psych  PSYCHIATRIC DISORDERS Anxiety     negative neurological ROS     GI/Hepatic negative GI ROS, Neg liver ROS,,,  Endo/Other  Hypothyroidism  Obesity   Renal/GU negative Renal ROS     Musculoskeletal  (+) Arthritis ,    Abdominal   Peds  Hematology negative hematology ROS (+) H/o hodgkins lymphoma Plt 222k   Anesthesia Other Findings Day of surgery medications reviewed with the patient.  Reproductive/Obstetrics                              Anesthesia Physical Anesthesia Plan  ASA: 3  Anesthesia Plan: Spinal   Post-op Pain Management: Tylenol PO (pre-op)* and Celebrex PO (pre-op)*   Induction: Intravenous  PONV Risk Score and Plan: 2 and TIVA, Treatment may vary due to age or medical condition and Midazolam  Airway Management Planned: Natural Airway and Simple Face Mask  Additional Equipment:   Intra-op Plan:   Post-operative Plan:   Informed Consent: I have reviewed the patients History and Physical, chart, labs and discussed the procedure including the risks, benefits and alternatives for the proposed anesthesia with the patient or authorized representative who has indicated his/her understanding and acceptance.     Dental advisory given  Plan Discussed with: CRNA, Anesthesiologist and Surgeon  Anesthesia Plan Comments: (Discussed  risks and benefits of and differences between spinal and general. Discussed risks of spinal including headache, backache, failure, bleeding, infection, and nerve damage. Patient consents to spinal. Questions answered. Coagulation studies and platelet count acceptable.)        Anesthesia Quick Evaluation

## 2022-11-06 ENCOUNTER — Observation Stay (HOSPITAL_COMMUNITY)
Admission: RE | Admit: 2022-11-06 | Discharge: 2022-11-08 | Disposition: A | Discharge status: Home / Self Care | Payer: BC Managed Care – PPO | Source: Ambulatory Visit | Attending: Orthopedic Surgery | Admitting: Orthopedic Surgery

## 2022-11-06 ENCOUNTER — Encounter (HOSPITAL_COMMUNITY)
Admission: RE | Disposition: A | Discharge status: Home / Self Care | Payer: Self-pay | Source: Ambulatory Visit | Attending: Orthopedic Surgery

## 2022-11-06 ENCOUNTER — Observation Stay (HOSPITAL_COMMUNITY): Payer: BC Managed Care – PPO

## 2022-11-06 ENCOUNTER — Ambulatory Visit (HOSPITAL_COMMUNITY): Payer: BC Managed Care – PPO | Admitting: Anesthesiology

## 2022-11-06 ENCOUNTER — Encounter (HOSPITAL_COMMUNITY): Payer: Self-pay | Admitting: Orthopedic Surgery

## 2022-11-06 ENCOUNTER — Ambulatory Visit (HOSPITAL_COMMUNITY): Payer: BC Managed Care – PPO | Admitting: Physician Assistant

## 2022-11-06 ENCOUNTER — Other Ambulatory Visit: Payer: Self-pay

## 2022-11-06 DIAGNOSIS — I10 Essential (primary) hypertension: Secondary | ICD-10-CM | POA: Diagnosis not present

## 2022-11-06 DIAGNOSIS — G8918 Other acute postprocedural pain: Secondary | ICD-10-CM | POA: Diagnosis not present

## 2022-11-06 DIAGNOSIS — Z87891 Personal history of nicotine dependence: Secondary | ICD-10-CM | POA: Diagnosis not present

## 2022-11-06 DIAGNOSIS — Z96651 Presence of right artificial knee joint: Secondary | ICD-10-CM | POA: Diagnosis not present

## 2022-11-06 DIAGNOSIS — Z8571 Personal history of Hodgkin lymphoma: Secondary | ICD-10-CM | POA: Insufficient documentation

## 2022-11-06 DIAGNOSIS — Z471 Aftercare following joint replacement surgery: Secondary | ICD-10-CM | POA: Diagnosis not present

## 2022-11-06 DIAGNOSIS — M1711 Unilateral primary osteoarthritis, right knee: Secondary | ICD-10-CM | POA: Diagnosis not present

## 2022-11-06 DIAGNOSIS — E039 Hypothyroidism, unspecified: Secondary | ICD-10-CM | POA: Diagnosis not present

## 2022-11-06 DIAGNOSIS — Z96652 Presence of left artificial knee joint: Secondary | ICD-10-CM | POA: Diagnosis not present

## 2022-11-06 DIAGNOSIS — Z79899 Other long term (current) drug therapy: Secondary | ICD-10-CM | POA: Diagnosis not present

## 2022-11-06 DIAGNOSIS — Z7982 Long term (current) use of aspirin: Secondary | ICD-10-CM | POA: Insufficient documentation

## 2022-11-06 HISTORY — PX: TOTAL KNEE ARTHROPLASTY: SHX125

## 2022-11-06 SURGERY — ARTHROPLASTY, KNEE, TOTAL
Anesthesia: Spinal | Site: Knee | Laterality: Right

## 2022-11-06 MED ORDER — SODIUM CHLORIDE (PF) 0.9 % IJ SOLN
INTRAMUSCULAR | Status: AC
  Filled 2022-11-06: qty 10

## 2022-11-06 MED ORDER — CIPROFLOXACIN IN D5W 400 MG/200ML IV SOLN
400.0000 mg | Freq: Three times a day (TID) | INTRAVENOUS | Status: AC
  Administered 2022-11-06 – 2022-11-07 (×2): 400 mg via INTRAVENOUS
  Filled 2022-11-06 (×3): qty 200

## 2022-11-06 MED ORDER — BUPIVACAINE LIPOSOME 1.3 % IJ SUSP
INTRAMUSCULAR | Status: AC
  Filled 2022-11-06: qty 20

## 2022-11-06 MED ORDER — PHENYLEPHRINE 80 MCG/ML (10ML) SYRINGE FOR IV PUSH (FOR BLOOD PRESSURE SUPPORT)
PREFILLED_SYRINGE | INTRAVENOUS | Status: DC | PRN
  Administered 2022-11-06 (×2): 80 ug via INTRAVENOUS

## 2022-11-06 MED ORDER — LIDOCAINE 2% (20 MG/ML) 5 ML SYRINGE
INTRAMUSCULAR | Status: DC | PRN
  Administered 2022-11-06: 40 mg via INTRAVENOUS

## 2022-11-06 MED ORDER — ROSUVASTATIN CALCIUM 20 MG PO TABS
20.0000 mg | ORAL_TABLET | Freq: Every evening | ORAL | Status: DC
  Administered 2022-11-06 – 2022-11-07 (×2): 20 mg via ORAL
  Filled 2022-11-06 (×2): qty 1

## 2022-11-06 MED ORDER — BUPIVACAINE LIPOSOME 1.3 % IJ SUSP
INTRAMUSCULAR | Status: DC | PRN
  Administered 2022-11-06: 20 mL

## 2022-11-06 MED ORDER — LORATADINE 10 MG PO TABS
10.0000 mg | ORAL_TABLET | Freq: Every day | ORAL | Status: DC
  Administered 2022-11-07 – 2022-11-08 (×2): 10 mg via ORAL
  Filled 2022-11-06 (×2): qty 1

## 2022-11-06 MED ORDER — PHENYLEPHRINE 80 MCG/ML (10ML) SYRINGE FOR IV PUSH (FOR BLOOD PRESSURE SUPPORT)
PREFILLED_SYRINGE | INTRAVENOUS | Status: AC
  Filled 2022-11-06: qty 10

## 2022-11-06 MED ORDER — PANTOPRAZOLE SODIUM 40 MG PO TBEC
40.0000 mg | DELAYED_RELEASE_TABLET | Freq: Every day | ORAL | Status: DC
  Administered 2022-11-06 – 2022-11-08 (×3): 40 mg via ORAL
  Filled 2022-11-06 (×3): qty 1

## 2022-11-06 MED ORDER — ONDANSETRON HCL 4 MG/2ML IJ SOLN
INTRAMUSCULAR | Status: AC
  Filled 2022-11-06: qty 2

## 2022-11-06 MED ORDER — BENAZEPRIL-HYDROCHLOROTHIAZIDE 20-25 MG PO TABS: 1.0000 | ORAL_TABLET | Freq: Every morning | ORAL | Status: DC

## 2022-11-06 MED ORDER — CIPROFLOXACIN IN D5W 400 MG/200ML IV SOLN
400.0000 mg | Freq: Once | INTRAVENOUS | Status: AC
  Administered 2022-11-06: 400 mg via INTRAVENOUS
  Filled 2022-11-06: qty 200

## 2022-11-06 MED ORDER — PHENOL 1.4 % MT LIQD: 1.0000 | OROMUCOSAL | Status: DC | PRN

## 2022-11-06 MED ORDER — CEFAZOLIN SODIUM-DEXTROSE 2-4 GM/100ML-% IV SOLN: 2.0000 g | INTRAVENOUS | Status: DC

## 2022-11-06 MED ORDER — METHOCARBAMOL 500 MG PO TABS
500.0000 mg | ORAL_TABLET | Freq: Four times a day (QID) | ORAL | Status: DC | PRN
  Administered 2022-11-06: 500 mg via ORAL
  Filled 2022-11-06: qty 1

## 2022-11-06 MED ORDER — CHLORHEXIDINE GLUCONATE 0.12 % MT SOLN
15.0000 mL | Freq: Once | OROMUCOSAL | Status: AC
  Administered 2022-11-06: 15 mL via OROMUCOSAL

## 2022-11-06 MED ORDER — BUPIVACAINE LIPOSOME 1.3 % IJ SUSP: 20.0000 mL | Freq: Once | INTRAMUSCULAR | Status: DC

## 2022-11-06 MED ORDER — GABAPENTIN 300 MG PO CAPS
300.0000 mg | ORAL_CAPSULE | Freq: Every day | ORAL | Status: DC
  Administered 2022-11-07: 300 mg via ORAL
  Filled 2022-11-06: qty 1

## 2022-11-06 MED ORDER — DOCUSATE SODIUM 100 MG PO CAPS
100.0000 mg | ORAL_CAPSULE | Freq: Two times a day (BID) | ORAL | Status: DC
  Administered 2022-11-06 – 2022-11-08 (×3): 100 mg via ORAL
  Filled 2022-11-06 (×3): qty 1

## 2022-11-06 MED ORDER — ACETAMINOPHEN 500 MG PO TABS
1000.0000 mg | ORAL_TABLET | Freq: Four times a day (QID) | ORAL | Status: AC
  Administered 2022-11-06 – 2022-11-07 (×3): 1000 mg via ORAL
  Filled 2022-11-06 (×3): qty 2

## 2022-11-06 MED ORDER — DEXAMETHASONE SODIUM PHOSPHATE 10 MG/ML IJ SOLN
INTRAMUSCULAR | Status: AC
  Filled 2022-11-06: qty 1

## 2022-11-06 MED ORDER — HYDROCHLOROTHIAZIDE 25 MG PO TABS
25.0000 mg | ORAL_TABLET | Freq: Every day | ORAL | Status: DC
  Administered 2022-11-07 – 2022-11-08 (×2): 25 mg via ORAL
  Filled 2022-11-06 (×2): qty 1

## 2022-11-06 MED ORDER — CELECOXIB 200 MG PO CAPS
400.0000 mg | ORAL_CAPSULE | Freq: Once | ORAL | Status: AC
  Administered 2022-11-06: 400 mg via ORAL
  Filled 2022-11-06: qty 2

## 2022-11-06 MED ORDER — LACTATED RINGERS IV SOLN: INTRAVENOUS | Status: DC

## 2022-11-06 MED ORDER — SODIUM CHLORIDE 0.9% FLUSH
INTRAVENOUS | Status: DC | PRN
  Administered 2022-11-06: 10 mL
  Administered 2022-11-06: 50 mL

## 2022-11-06 MED ORDER — VANCOMYCIN HCL IN DEXTROSE 1-5 GM/200ML-% IV SOLN
1000.0000 mg | Freq: Once | INTRAVENOUS | Status: AC
  Administered 2022-11-06: 1000 mg via INTRAVENOUS
  Filled 2022-11-06: qty 200

## 2022-11-06 MED ORDER — KETOROLAC TROMETHAMINE 15 MG/ML IJ SOLN
7.5000 mg | Freq: Four times a day (QID) | INTRAMUSCULAR | Status: AC
  Administered 2022-11-06 – 2022-11-07 (×3): 7.5 mg via INTRAVENOUS
  Filled 2022-11-06 (×3): qty 1

## 2022-11-06 MED ORDER — PROPOFOL 1000 MG/100ML IV EMUL
INTRAVENOUS | Status: AC
  Filled 2022-11-06: qty 100

## 2022-11-06 MED ORDER — ASPIRIN 81 MG PO CHEW
81.0000 mg | CHEWABLE_TABLET | Freq: Two times a day (BID) | ORAL | Status: DC
  Administered 2022-11-06 – 2022-11-08 (×4): 81 mg via ORAL
  Filled 2022-11-06 (×4): qty 1

## 2022-11-06 MED ORDER — PROPOFOL 10 MG/ML IV BOLUS
INTRAVENOUS | Status: AC
  Filled 2022-11-06: qty 20

## 2022-11-06 MED ORDER — PROPOFOL 500 MG/50ML IV EMUL
INTRAVENOUS | Status: DC | PRN
  Administered 2022-11-06: 40 ug/kg/min via INTRAVENOUS
  Administered 2022-11-06: 100 ug/kg/min via INTRAVENOUS

## 2022-11-06 MED ORDER — PROMETHAZINE HCL 25 MG/ML IJ SOLN: 6.2500 mg | INTRAMUSCULAR | Status: DC | PRN

## 2022-11-06 MED ORDER — ORAL CARE MOUTH RINSE: 15.0000 mL | Freq: Once | OROMUCOSAL | Status: AC

## 2022-11-06 MED ORDER — MENTHOL 3 MG MT LOZG
1.0000 | LOZENGE | OROMUCOSAL | Status: DC | PRN
  Filled 2022-11-06: qty 9

## 2022-11-06 MED ORDER — DEXAMETHASONE SODIUM PHOSPHATE 10 MG/ML IJ SOLN: 8.0000 mg | Freq: Once | INTRAMUSCULAR | Status: DC

## 2022-11-06 MED ORDER — PROPOFOL 10 MG/ML IV BOLUS
INTRAVENOUS | Status: DC | PRN
  Administered 2022-11-06: 30 mg via INTRAVENOUS

## 2022-11-06 MED ORDER — DEXAMETHASONE SODIUM PHOSPHATE 10 MG/ML IJ SOLN
INTRAMUSCULAR | Status: DC | PRN
  Administered 2022-11-06: 10 mg

## 2022-11-06 MED ORDER — ACETAMINOPHEN 325 MG PO TABS: 325.0000 mg | ORAL_TABLET | Freq: Four times a day (QID) | ORAL | Status: DC | PRN

## 2022-11-06 MED ORDER — VANCOMYCIN HCL IN DEXTROSE 1-5 GM/200ML-% IV SOLN
1000.0000 mg | Freq: Two times a day (BID) | INTRAVENOUS | Status: AC
  Administered 2022-11-06: 1000 mg via INTRAVENOUS
  Filled 2022-11-06: qty 200

## 2022-11-06 MED ORDER — SODIUM CHLORIDE (PF) 0.9 % IJ SOLN
INTRAMUSCULAR | Status: AC
  Filled 2022-11-06: qty 50

## 2022-11-06 MED ORDER — METHOCARBAMOL 500 MG IVPB - SIMPLE MED
INTRAVENOUS | Status: AC
  Filled 2022-11-06: qty 55

## 2022-11-06 MED ORDER — SENNA 8.6 MG PO TABS
1.0000 | ORAL_TABLET | Freq: Every day | ORAL | Status: DC
  Administered 2022-11-06: 8.6 mg via ORAL
  Filled 2022-11-06: qty 1

## 2022-11-06 MED ORDER — ESCITALOPRAM OXALATE 20 MG PO TABS
20.0000 mg | ORAL_TABLET | Freq: Every day | ORAL | Status: DC
  Administered 2022-11-06 – 2022-11-07 (×2): 20 mg via ORAL
  Filled 2022-11-06 (×2): qty 1

## 2022-11-06 MED ORDER — TRANEXAMIC ACID-NACL 1000-0.7 MG/100ML-% IV SOLN
1000.0000 mg | INTRAVENOUS | Status: AC
  Administered 2022-11-06: 1000 mg via INTRAVENOUS
  Filled 2022-11-06: qty 100

## 2022-11-06 MED ORDER — OXYCODONE HCL 5 MG PO TABS
5.0000 mg | ORAL_TABLET | ORAL | Status: DC | PRN
  Administered 2022-11-06 – 2022-11-08 (×5): 10 mg via ORAL
  Filled 2022-11-06 (×4): qty 2

## 2022-11-06 MED ORDER — 0.9 % SODIUM CHLORIDE (POUR BTL) OPTIME
TOPICAL | Status: DC | PRN
  Administered 2022-11-06: 1000 mL

## 2022-11-06 MED ORDER — ROPIVACAINE HCL 5 MG/ML IJ SOLN
INTRAMUSCULAR | Status: DC | PRN
  Administered 2022-11-06: 20 mL via PERINEURAL

## 2022-11-06 MED ORDER — ONDANSETRON HCL 4 MG/2ML IJ SOLN: 4.0000 mg | Freq: Four times a day (QID) | INTRAMUSCULAR | Status: DC | PRN

## 2022-11-06 MED ORDER — POVIDONE-IODINE 10 % EX SWAB
2.0000 | Freq: Once | CUTANEOUS | Status: AC
  Administered 2022-11-06: 2 via TOPICAL

## 2022-11-06 MED ORDER — FENTANYL CITRATE PF 50 MCG/ML IJ SOSY
PREFILLED_SYRINGE | INTRAMUSCULAR | Status: AC
  Administered 2022-11-06: 25 ug via INTRAVENOUS
  Filled 2022-11-06: qty 1

## 2022-11-06 MED ORDER — AMLODIPINE BESYLATE 10 MG PO TABS
10.0000 mg | ORAL_TABLET | Freq: Every day | ORAL | Status: DC
  Administered 2022-11-06 – 2022-11-07 (×2): 10 mg via ORAL
  Filled 2022-11-06 (×2): qty 1

## 2022-11-06 MED ORDER — WATER FOR IRRIGATION, STERILE IR SOLN
Status: DC | PRN
  Administered 2022-11-06: 1000 mL

## 2022-11-06 MED ORDER — LIDOCAINE HCL (PF) 2 % IJ SOLN
INTRAMUSCULAR | Status: AC
  Filled 2022-11-06: qty 5

## 2022-11-06 MED ORDER — ONDANSETRON HCL 4 MG PO TABS: 4.0000 mg | ORAL_TABLET | Freq: Four times a day (QID) | ORAL | Status: DC | PRN

## 2022-11-06 MED ORDER — MIDAZOLAM HCL 2 MG/2ML IJ SOLN
1.0000 mg | Freq: Once | INTRAMUSCULAR | Status: AC
  Administered 2022-11-06: 2 mg via INTRAVENOUS
  Filled 2022-11-06: qty 2

## 2022-11-06 MED ORDER — POLYETHYLENE GLYCOL 3350 17 G PO PACK
17.0000 g | PACK | Freq: Two times a day (BID) | ORAL | Status: DC
  Administered 2022-11-06 – 2022-11-08 (×3): 17 g via ORAL
  Filled 2022-11-06 (×3): qty 1

## 2022-11-06 MED ORDER — BENAZEPRIL HCL 20 MG PO TABS
20.0000 mg | ORAL_TABLET | Freq: Every day | ORAL | Status: DC
  Administered 2022-11-07 – 2022-11-08 (×2): 20 mg via ORAL
  Filled 2022-11-06: qty 1

## 2022-11-06 MED ORDER — POVIDONE-IODINE 10 % EX SWAB: 2.0000 | Freq: Once | CUTANEOUS | Status: DC

## 2022-11-06 MED ORDER — PHENYLEPHRINE HCL-NACL 20-0.9 MG/250ML-% IV SOLN
INTRAVENOUS | Status: DC | PRN
  Administered 2022-11-06: 25 ug/min via INTRAVENOUS

## 2022-11-06 MED ORDER — LEVOTHYROXINE SODIUM 75 MCG PO TABS
75.0000 ug | ORAL_TABLET | Freq: Every day | ORAL | Status: DC
  Administered 2022-11-07 – 2022-11-08 (×2): 75 ug via ORAL
  Filled 2022-11-06 (×2): qty 1

## 2022-11-06 MED ORDER — DIPHENHYDRAMINE HCL 12.5 MG/5ML PO ELIX: 12.5000 mg | ORAL_SOLUTION | ORAL | Status: DC | PRN

## 2022-11-06 MED ORDER — ACETAMINOPHEN 500 MG PO TABS
1000.0000 mg | ORAL_TABLET | Freq: Once | ORAL | Status: AC
  Administered 2022-11-06: 1000 mg via ORAL
  Filled 2022-11-06: qty 2

## 2022-11-06 MED ORDER — FENTANYL CITRATE PF 50 MCG/ML IJ SOSY
50.0000 ug | PREFILLED_SYRINGE | Freq: Once | INTRAMUSCULAR | Status: AC
  Administered 2022-11-06: 50 ug via INTRAVENOUS
  Filled 2022-11-06: qty 2

## 2022-11-06 MED ORDER — METHOCARBAMOL 500 MG IVPB - SIMPLE MED
500.0000 mg | Freq: Four times a day (QID) | INTRAVENOUS | Status: DC | PRN
  Administered 2022-11-06: 500 mg via INTRAVENOUS

## 2022-11-06 MED ORDER — OXYCODONE HCL 5 MG PO TABS
ORAL_TABLET | ORAL | Status: AC
  Filled 2022-11-06: qty 2

## 2022-11-06 MED ORDER — PROPOFOL 500 MG/50ML IV EMUL
INTRAVENOUS | Status: AC
  Filled 2022-11-06: qty 50

## 2022-11-06 MED ORDER — ADULT MULTIVITAMIN W/MINERALS CH
1.0000 | ORAL_TABLET | Freq: Every morning | ORAL | Status: DC
  Administered 2022-11-07 – 2022-11-08 (×2): 1 via ORAL
  Filled 2022-11-06 (×2): qty 1

## 2022-11-06 MED ORDER — FENTANYL CITRATE PF 50 MCG/ML IJ SOSY
25.0000 ug | PREFILLED_SYRINGE | INTRAMUSCULAR | Status: DC | PRN
  Administered 2022-11-06: 25 ug via INTRAVENOUS

## 2022-11-06 MED ORDER — ONDANSETRON HCL 4 MG/2ML IJ SOLN
INTRAMUSCULAR | Status: DC | PRN
  Administered 2022-11-06: 4 mg via INTRAVENOUS

## 2022-11-06 MED ORDER — DEXAMETHASONE SODIUM PHOSPHATE 10 MG/ML IJ SOLN
INTRAMUSCULAR | Status: DC | PRN
  Administered 2022-11-06: 10 mg via INTRAVENOUS

## 2022-11-06 MED ORDER — ZOLPIDEM TARTRATE 5 MG PO TABS: 5.0000 mg | ORAL_TABLET | Freq: Every evening | ORAL | Status: DC | PRN

## 2022-11-06 MED ORDER — SODIUM CHLORIDE 0.9 % IR SOLN
Status: DC | PRN
  Administered 2022-11-06: 3000 mL

## 2022-11-06 SURGICAL SUPPLY — 61 items
BAG COUNTER SPONGE SURGICOUNT (BAG) IMPLANT
BLADE SAG 18X100X1.27 (BLADE) ×1 IMPLANT
BLADE SAW SAG 35X64 .89 (BLADE) ×1 IMPLANT
BNDG COHESIVE 3X5 TAN ST LF (GAUZE/BANDAGES/DRESSINGS) ×1 IMPLANT
BNDG ELASTIC 6X10 VLCR STRL LF (GAUZE/BANDAGES/DRESSINGS) ×1 IMPLANT
BNDG ELASTIC 6X5.8 VLCR STR LF (GAUZE/BANDAGES/DRESSINGS) IMPLANT
BOWL SMART MIX CTS (DISPOSABLE) ×1 IMPLANT
CEMENT BONE R 1X40 (Cement) IMPLANT
CHLORAPREP W/TINT 26 (MISCELLANEOUS) ×2 IMPLANT
COMP FEM CMT CR PERS SZ8 RT (Joint) ×1 IMPLANT
COMPONENT FEM CMT CR PRS SZ8RT (Joint) IMPLANT
COVER SURGICAL LIGHT HANDLE (MISCELLANEOUS) ×1 IMPLANT
CUFF TOURN SGL QUICK 34 (TOURNIQUET CUFF) ×1
CUFF TRNQT CYL 34X4.125X (TOURNIQUET CUFF) ×1 IMPLANT
DERMABOND ADVANCED .7 DNX12 (GAUZE/BANDAGES/DRESSINGS) ×1 IMPLANT
DERMABOND ADVANCED .7 DNX6 (GAUZE/BANDAGES/DRESSINGS) IMPLANT
DRAPE INCISE IOBAN 85X60 (DRAPES) ×1 IMPLANT
DRAPE SHEET LG 3/4 BI-LAMINATE (DRAPES) ×1 IMPLANT
DRAPE U-SHAPE 47X51 STRL (DRAPES) ×1 IMPLANT
DRESSING AQUACEL AG SP 3.5X10 (GAUZE/BANDAGES/DRESSINGS) ×1 IMPLANT
DRSG AQUACEL AG ADV 3.5X10 (GAUZE/BANDAGES/DRESSINGS) IMPLANT
DRSG AQUACEL AG SP 3.5X10 (GAUZE/BANDAGES/DRESSINGS) ×1
ELECT REM PT RETURN 15FT ADLT (MISCELLANEOUS) ×1 IMPLANT
GAUZE SPONGE 4X4 12PLY STRL (GAUZE/BANDAGES/DRESSINGS) ×1 IMPLANT
GLOVE BIO SURGEON STRL SZ 6.5 (GLOVE) ×2 IMPLANT
GLOVE BIOGEL PI IND STRL 6.5 (GLOVE) ×1 IMPLANT
GLOVE BIOGEL PI IND STRL 8 (GLOVE) ×1 IMPLANT
GLOVE SURG ORTHO 8.0 STRL STRW (GLOVE) ×2 IMPLANT
GOWN STRL REUS W/ TWL XL LVL3 (GOWN DISPOSABLE) ×2 IMPLANT
GOWN STRL REUS W/TWL XL LVL3 (GOWN DISPOSABLE) ×2
HANDPIECE INTERPULSE COAX TIP (DISPOSABLE) ×1
HDLS TROCR DRIL PIN KNEE 75 (PIN) ×1
HOLDER FOLEY CATH W/STRAP (MISCELLANEOUS) ×1 IMPLANT
HOOD PEEL AWAY T7 (MISCELLANEOUS) ×3 IMPLANT
INSERT TIBIA KNEE RIGHT 10 (Joint) IMPLANT
MANIFOLD NEPTUNE II (INSTRUMENTS) ×1 IMPLANT
MARKER SKIN DUAL TIP RULER LAB (MISCELLANEOUS) ×1 IMPLANT
NS IRRIG 1000ML POUR BTL (IV SOLUTION) ×1 IMPLANT
PACK TOTAL KNEE CUSTOM (KITS) ×1 IMPLANT
PIN DRILL HDLS TROCAR 75 4PK (PIN) IMPLANT
PROTECTOR NERVE ULNAR (MISCELLANEOUS) ×1 IMPLANT
SCREW HEADED 33MM KNEE (MISCELLANEOUS) IMPLANT
SET HNDPC FAN SPRY TIP SCT (DISPOSABLE) ×1 IMPLANT
SOLUTION IRRIG SURGIPHOR (IV SOLUTION) IMPLANT
SPIKE FLUID TRANSFER (MISCELLANEOUS) ×1 IMPLANT
STEM POLY PAT PLY 29M KNEE (Knees) IMPLANT
STEM TIBIA 5 DEG SZ E R KNEE (Knees) IMPLANT
STRIP CLOSURE SKIN 1/2X4 (GAUZE/BANDAGES/DRESSINGS) ×1 IMPLANT
SUT MNCRL AB 3-0 PS2 18 (SUTURE) ×1 IMPLANT
SUT STRATAFIX 0 PDS 27 VIOLET (SUTURE) ×1
SUT STRATAFIX PDO 1 14 VIOLET (SUTURE) ×1
SUT STRATFX PDO 1 14 VIOLET (SUTURE) ×1
SUT VIC AB 2-0 CT2 27 (SUTURE) ×2 IMPLANT
SUTURE STRATFX 0 PDS 27 VIOLET (SUTURE) ×1 IMPLANT
SUTURE STRATFX PDO 1 14 VIOLET (SUTURE) ×1 IMPLANT
SYR 50ML LL SCALE MARK (SYRINGE) ×1 IMPLANT
TIBIA STEM 5 DEG SZ E R KNEE (Knees) ×1 IMPLANT
TRAY FOLEY MTR SLVR 14FR STAT (SET/KITS/TRAYS/PACK) IMPLANT
TUBE SUCTION HIGH CAP CLEAR NV (SUCTIONS) ×1 IMPLANT
UNDERPAD 30X36 HEAVY ABSORB (UNDERPADS AND DIAPERS) ×1 IMPLANT
WRAP KNEE MAXI GEL POST OP (GAUZE/BANDAGES/DRESSINGS) IMPLANT

## 2022-11-06 NOTE — Op Note (Signed)
DATE OF SURGERY:  11/06/2022 TIME: 2:00 PM  PATIENT NAME:  [name]   AGE: 62 y.o.    PRE-OPERATIVE DIAGNOSIS:  end-stage right knee osteoarthritis  POST-OPERATIVE DIAGNOSIS:  Same  PROCEDURE: Right total Knee Arthroplasty  SURGEON:  Jewelz Kobus A Ziyon Cedotal, MD   ASSISTANT: Izola Price, RNFA, present and scrubbed throughout the case, critical for assistance with exposure, retraction, instrumentation, and closure.   OPERATIVE IMPLANTS:  Cemented Zimmer persona right CR femur 8 narrow, E tibia baseplate, 29 mm patella, 10 mm MC poly Implant Name Type Inv. Item Serial No. Manufacturer Lot No. LRB No. Used Action  CEMENT BONE R 1X40 - [id number] Cement CEMENT BONE R 1X40  [employer](ORTH,TRAU,BIO,SG) [id number] Right 1 Implanted  CEMENT BONE R 1X40 - [id number] Cement CEMENT BONE R 1X40  [employer]IMMER [employer](ORTH,TRAU,BIO,SG) [id number] Right 1 Implanted  TIBIA STEM 5 DEG SZ E R KNEE - [id number] Knees TIBIA STEM 5 DEG SZ E R KNEE  ZIMMER [employer]ON(ORTH,TRAU,BIO,SG) [id number] Right 1 Implanted  COMP FEM CMT CR PERS SZ8 RT - [id number] Joint COMP FEM CMT CR PERS SZ8 RT  [employer]ON(ORTH,TRAU,BIO,SG) [id number] Right 1 Implanted  STEM POLY PAT PLY 53M KNEE - [id number] Knees STEM POLY PAT PLY 53M KNEE  ZIMMER [employer]ON(ORTH,TRAU,BIO,SG) [id number] Right 1 Implanted  INSERT TIBIA KNEE RIGHT 10 - [id number] Joint INSERT TIBIA KNEE RIGHT 10  ZIMMER [employer]ON(ORTH,TRAU,BIO,SG) [id number] Right 1 Implanted      PREOPERATIVE INDICATIONS:  [name] is a 62 y.o. year old female with end stage bone on bone degenerative arthritis of the knee who failed conservative treatment, including injections, antiinflammatories, activity modification, and assistive devices, and had significant impairment of their activities of daily living, and elected for Total Knee Arthroplasty.   The risks, benefits, and alternatives were discussed at length including but not limited to the risks of infection, bleeding, nerve injury, stiffness,  blood clots, the need for revision surgery, cardiopulmonary complications, among others, and they were willing to proceed.  ESTIMATED BLOOD LOSS: 25cc  OPERATIVE DESCRIPTION:   Once adequate anesthesia was induced, preoperative antibiotics, 1 gm of vanc and '400mg'$  ciprofloxacin given cephalosporin allergy,1 gm of Tranexamic Acid, and 8 mg of Decadron administered, the patient was positioned supine with a right thigh tourniquet placed.  The right lower extremity was prepped and draped in sterile fashion.  A time-  out was performed identifying the patient, planned procedure, and the appropriate extremity.     The leg was  exsanguinated, tourniquet elevated to 250 mmHg.  A midline incision was  made followed by median parapatellar arthrotomy. Anterior horn of the medial meniscus was released and resected. A medial release was performed, the infrapatellar fat pad was resected with care taken to protect the patellar tendon. The suprapatellar fat was removed to exposed the distal anterior femur. The anterior horn of the lateral meniscus and ACL were released.    Following initial  exposure, I first started with the femur  The femoral  canal was opened with a drill, canal was suctioned to try to prevent fat emboli.  An  intramedullary rod was passed set at 5 degrees valgus, 11 mm given 5 degree preop flexion contracture. The distal femur was resected.  Following this resection, the tibia was  subluxated anteriorly.  Using the extramedullary guide, 10 mm of bone was resected off  the proximal lateral tibia.  We confirmed the gap would be  stable medially and laterally with a size 35m spacer block as well as confirmed that the  tibial cut was perpendicular in the coronal plane, checking with an alignment rod.    Once this was done, the posterior femoral referencing femoral sizer was placed under to the posterior condyles with 3 degrees of external rotational which was parallel to the transepicondylar axis and  perpendicular to [employer]. The femur was sized to be a size 8 in the anterior-  posterior dimension. The  anterior, posterior, and  chamfer cuts were made without difficulty nor   notching making certain that I was along the anterior cortex to help  with flexion gap stability. Next a laminar spreader was placed with the knee in flexion and the medial lateral menisci were resected.  5 cc of the Exparel mixture was injected in the medial side of the back of the knee and 3 cc in the lateral side.  1/2 inch curved osteotome was used to resect posterior osteophyte that was then removed with a pituitary rongeur.       At this point, the tibia was sized to be a size E.  The size E tray was  then pinned in position. Trial reduction was now carried with a 8 femur, E tibia, a 10 mm MC insert.  The knee had full extension and was stable to varus valgus stress in extension.  The knee was slightly tight in flexion and the PCL was completely released and excised.  Attention was next directed to the patella.  Precut  measurement was noted to be 21 mm.  I resected down to 14 mm and used a  28m patellar button to restore patellar height as well as cover the cut surface.     The patella lug holes were drilled and a 224mpatella poly trial was placed.    The knee was brought to full extension with good flexion stability with the patella tracking through the trochlea without application of pressure.     Next the femoral component was again assessed and determined to be seated and appropriately lateralized.  The femoral lug holes were drilled.  The femoral component was then removed. Tibial component was again assessed and felt to be seated and appropriately rotated with the medial third of the tubercle. The tibia was then drilled, and keel punched.     Final components were  opened and regular cement was mixed.      Final implants were then  cemented onto cleaned and dried cut surfaces of bone with the knee  brought to extension with a 10 mm MC poly.  The knee was irrigated with sterile Betadine diluted in saline as well as pulse lavage normal saline. The synovial lining was  then injected a dilute Exparel.      Once the cement had fully cured, excess cement was removed throughout the knee.  I confirmed that I was satisfied with the range of motion and stability, and the final 1072mC poly insert was chosen.  It was placed into the knee.         The tourniquet had been let down at 65 minutes.  No significant hemostasis was required.  The medial parapatellar arthrotomy was then reapproximated using #1 Stratafix sutures with the knee  in flexion.  The remaining wound was closed with 0 stratafix, 2-0 Vicryl, and running 3-0 Monocryl. The knee was cleaned, dried, dressed sterilely using Dermabond and   Aquacel dressing.  The patient was then brought to recovery room in stable condition, tolerating the procedure  well. There were no complications.  Post op recs: WB: WBAT Abx: vanc and cipro given cephalosporin allergy Imaging: PACU xrays DVT prophylaxis: Aspirin '81mg'$  BID x4 weeks Follow up: 2 weeks after surgery for a wound check with Dr. Zachery Dakins at [facility]  Address: [address], [place], [place] [address]  Office Phone: [phone]  Charlies Constable, MD Orthopaedic Surgery

## 2022-11-06 NOTE — Anesthesia Procedure Notes (Signed)
Anesthesia Regional Block: Adductor canal block   Pre-Anesthetic Checklist: , timeout performed,  Correct Patient, Correct Site, Correct Laterality,  Correct Procedure, Correct Position, site marked,  Risks and benefits discussed,  Surgical consent,  Pre-op evaluation,  At surgeon's request and post-op pain management  Laterality: Right  Prep: chloraprep       Needles:  Injection technique: Single-shot  Needle Type: Echogenic Needle     Needle Length: 9cm  Needle Gauge: 21     Additional Needles:   Procedures:,,,, ultrasound used (permanent image in chart),,    Narrative:  Start time: 11/06/2022 11:30 AM End time: 11/06/2022 11:39 AM Injection made incrementally with aspirations every 5 mL.  Performed by: Personally  Anesthesiologist: [name], MD  Additional Notes: No pain on injection. No increased resistance to injection. Injection made in 5cc increments.  Good needle visualization.  Patient tolerated procedure well.

## 2022-11-06 NOTE — Anesthesia Procedure Notes (Signed)
Procedure Name: MAC Date/Time: 11/06/2022 12:20 PM  Performed by: Jenne Campus, CRNAPre-anesthesia Checklist: Patient identified, Emergency Drugs available, Suction available and Patient being monitored Placement Confirmation: positive ETCO2

## 2022-11-06 NOTE — Plan of Care (Signed)
  Problem: Pain Management: Goal: Pain level will decrease with appropriate interventions Outcome: Progressing   Problem: Nutrition: Goal: Adequate nutrition will be maintained Outcome: Progressing   

## 2022-11-06 NOTE — Evaluation (Signed)
Physical Therapy Evaluation Patient Details Name: [name] MRN: [id number] DOB: [birth year] Today's Date: 11/06/2022  History of Present Illness  Pt is a 62yo female presenting s/p R-TKA on 11/06/22. PMH: hx of Hodkin's Lymphoma, HTN, L-TKA 2018  Clinical Impression  [name] is a 62 y.o. female POD 0 s/p R-TKA. Patient reports independence with mobility at baseline. Patient is now limited by functional impairments (see PT problem list below) and requires supervision for bed mobility and min assist for transfers. Patient was able to ambulate 25 feet with RW and min guard level of assist. Patient instructed in exercise to facilitate ROM and circulation to manage edema. Provided incentive spirometer and with Vcs pt able to achieve 1258m. Patient will benefit from continued skilled PT interventions to address impairments and progress towards PLOF. Acute PT will follow to progress mobility and stair training in preparation for safe discharge home.       Recommendations for follow up therapy are one component of a multi-disciplinary discharge planning process, led by the attending physician.  Recommendations may be updated based on patient status, additional functional criteria and insurance authorization.  Follow Up Recommendations Follow physician's recommendations for discharge plan and follow up therapies      Assistance Recommended at Discharge Frequent or constant Supervision/Assistance  Patient can return home with the following  A little help with walking and/or transfers;A little help with bathing/dressing/bathroom;Assistance with cooking/housework;Help with stairs or ramp for entrance;Assist for transportation    Equipment Recommendations None recommended by PT  Recommendations for Other Services       Functional Status Assessment Patient has had a recent decline in their functional status and demonstrates the ability to make significant improvements in function in a reasonable and  predictable amount of time.     Precautions / Restrictions Precautions Precautions: Fall;Knee Precaution Booklet Issued: No Precaution Comments: no pillow under the knee Restrictions Weight Bearing Restrictions: No Other Position/Activity Restrictions: wbat      Mobility  Bed Mobility Overal bed mobility: Needs Assistance Bed Mobility: Supine to Sit     Supine to sit: Supervision, HOB elevated     General bed mobility comments: For safety only    Transfers Overall transfer level: Needs assistance Equipment used: Rolling walker (2 wheels) Transfers: Sit to/from Stand Sit to Stand: Min assist, From elevated surface           General transfer comment: Min assist for steadying of AD as pt pulled up on RW, no lift assist required.    Ambulation/Gait Ambulation/Gait assistance: Min guard, +2 safety/equipment Gait Distance (Feet): 25 Feet Assistive device: Rolling walker (2 wheels) Gait Pattern/deviations: Step-to pattern, Decreased stance time - right, Decreased stride length Gait velocity: decreased     General Gait Details: Pt ambulated with RW and min guard, no physical assist required or overt LOB noted, VCs for proximity to device and shortening step length, +2 for recliner follow for safety.  Stairs            Wheelchair Mobility    Modified Rankin (Stroke Patients Only)       Balance Overall balance assessment: Needs assistance Sitting-balance support: Feet supported, No upper extremity supported Sitting balance-Leahy Scale: Good     Standing balance support: Reliant on assistive device for balance, During functional activity, Bilateral upper extremity supported Standing balance-Leahy Scale: Poor  Pertinent Vitals/Pain Pain Assessment Pain Assessment: 0-10 Pain Score: 7  Pain Location: right knee Pain Descriptors / Indicators: Operative site guarding Pain Intervention(s): Limited activity within  patient's tolerance, Monitored during session, Repositioned, Ice applied    Home Living Family/patient expects to be discharged to:: Private residence Living Arrangements: Spouse/significant other Available Help at Discharge: Family;Available 24 hours/day Type of Home: Mobile home Home Access: Stairs to enter Entrance Stairs-Rails: Right;Left;Can reach both Entrance Stairs-Number of Steps: 3   Home Layout: One level Home Equipment: BSC/3in1;Rolling Walker (2 wheels);Cane - single point      Prior Function Prior Level of Function : Independent/Modified Independent;Working/employed;Driving Surveyor, mining)             Mobility Comments: IND ADLs Comments: IND     Hand Dominance   Dominant Hand: Right    Extremity/Trunk Assessment   Upper Extremity Assessment Upper Extremity Assessment: Overall WFL for tasks assessed    Lower Extremity Assessment Lower Extremity Assessment: RLE deficits/detail;LLE deficits/detail RLE Deficits / Details: MMT ank DF/PF 5/5, no extensor lag noted RLE Sensation: WNL LLE Deficits / Details: MMT ank DF/PF 5/5 LLE Sensation: WNL    Cervical / Trunk Assessment Cervical / Trunk Assessment: Kyphotic  Communication   Communication: No difficulties  Cognition Arousal/Alertness: Awake/alert Behavior During Therapy: WFL for tasks assessed/performed Overall Cognitive Status: Within Functional Limits for tasks assessed                                          General Comments General comments (skin integrity, edema, etc.): Husband present    Exercises Total Joint Exercises Ankle Circles/Pumps: AROM, Both, 20 reps   Assessment/Plan    PT Assessment Patient needs continued PT services  PT Problem List Decreased strength;Decreased range of motion;Decreased activity tolerance;Decreased balance;Decreased mobility;Decreased coordination;Pain       PT Treatment Interventions DME instruction;Gait training;Stair  training;Functional mobility training;Therapeutic activities;Therapeutic exercise;Balance training;Neuromuscular re-education;Patient/family education    PT Goals (Current goals can be found in the Care Plan section)  Acute Rehab PT Goals Patient Stated Goal: Walk without pain PT Goal Formulation: With patient Time For Goal Achievement: 11/13/22 Potential to Achieve Goals: Good    Frequency 7X/week     Co-evaluation               AM-PAC PT "6 Clicks" Mobility  Outcome Measure Help needed turning from your back to your side while in a flat bed without using bedrails?: None Help needed moving from lying on your back to sitting on the side of a flat bed without using bedrails?: A Little Help needed moving to and from a bed to a chair (including a wheelchair)?: A Little Help needed standing up from a chair using your arms (e.g., wheelchair or bedside chair)?: A Little Help needed to walk in hospital room?: A Little Help needed climbing 3-5 steps with a railing? : A Little 6 Click Score: 19    End of Session Equipment Utilized During Treatment: Gait belt Activity Tolerance: Patient tolerated treatment well Patient left: in chair;with call bell/phone within reach;with chair alarm set;with family/visitor present;with SCD's reapplied Nurse Communication: Mobility status PT Visit Diagnosis: Pain;Difficulty in walking, not elsewhere classified (R26.2) Pain - Right/Left: Right Pain - part of body: Knee    Time: 1730-1751 PT Time Calculation (min) (ACUTE ONLY): 21 min   Charges:  Coolidge Breeze, PT, DPT [facility] Office: [phone] Weekend pager: [phone]  Coolidge Breeze 11/06/2022, 5:58 PM

## 2022-11-06 NOTE — Progress Notes (Signed)
Orthopedic Tech Progress Note Patient Details:  [name] 09-17-1961 [id number]  Patient ID: [name], female   DOB: 1961/04/11, 62 y.o.   MRN: [id number]  [name] 11/06/2022, 2:34 PM Bone foam applied in pacu

## 2022-11-06 NOTE — Discharge Instructions (Addendum)

## 2022-11-06 NOTE — Transfer of Care (Signed)
Immediate Anesthesia Transfer of Care Note  Patient: [name]  Procedure(s) Performed: TOTAL KNEE ARTHROPLASTY (Right: Knee)  Patient Location: PACU  Anesthesia Type:MAC  Level of Consciousness: oriented, drowsy, and patient cooperative  Airway & Oxygen Therapy: Patient Spontanous Breathing and Patient connected to face mask oxygen  Post-op Assessment: Report given to RN and Post -op Vital signs reviewed and stable  Post vital signs: Reviewed  Last Vitals:  Vitals Value Taken Time  BP 116/63 11/06/22 1430  Temp    Pulse 73 11/06/22 1432  Resp 15 11/06/22 1432  SpO2 97 % 11/06/22 1432  Vitals shown include unvalidated device data.  Last Pain:  Vitals:   11/06/22 1200  TempSrc:   PainSc: 0-No pain         Complications: No notable events documented.

## 2022-11-06 NOTE — Anesthesia Procedure Notes (Addendum)
Spinal  Patient location during procedure: OR Start time: 11/06/2022 12:18 PM End time: 11/06/2022 12:24 PM Reason for block: surgical anesthesia Staffing Performed: resident/CRNA  Resident/CRNA: Jenne Campus, CRNA Performed by: Jenne Campus, CRNA Authorized by: [name], MD   Preanesthetic Checklist Completed: patient identified, IV checked, site marked, risks and benefits discussed, surgical consent, monitors and equipment checked, pre-op evaluation and timeout performed Spinal Block Patient position: sitting Prep: DuraPrep Patient monitoring: heart rate, cardiac monitor, continuous pulse ox and blood pressure Approach: midline Location: L3-4 Injection technique: single-shot Needle Needle type: Pencan  Needle gauge: 24 G Needle length: 10 cm Assessment Sensory level: T6 Events: CSF return Additional Notes IV functioning, monitors applied to pt. Expiration date of kit checked and confirmed to be in date.  Sterile prep and drape, including hand hygiene, mask and sterile gloves were used. The patient was positioned and the spine was prepped. Skin was anesthetized with lidocaine. Free flow of clear CSF obtained prior to injecting local anesthetic into the CSF. Spinal needle aspirated freely following injection. Needle was carefully withdrawn, and pt tolerated procedure well. Loss of motor and sensory on exam post injection.

## 2022-11-06 NOTE — Interval H&P Note (Signed)

## 2022-11-07 DIAGNOSIS — Z87891 Personal history of nicotine dependence: Secondary | ICD-10-CM | POA: Diagnosis not present

## 2022-11-07 DIAGNOSIS — Z96652 Presence of left artificial knee joint: Secondary | ICD-10-CM | POA: Diagnosis not present

## 2022-11-07 DIAGNOSIS — I10 Essential (primary) hypertension: Secondary | ICD-10-CM | POA: Diagnosis not present

## 2022-11-07 DIAGNOSIS — E039 Hypothyroidism, unspecified: Secondary | ICD-10-CM | POA: Diagnosis not present

## 2022-11-07 DIAGNOSIS — Z8571 Personal history of Hodgkin lymphoma: Secondary | ICD-10-CM | POA: Diagnosis not present

## 2022-11-07 DIAGNOSIS — Z79899 Other long term (current) drug therapy: Secondary | ICD-10-CM | POA: Diagnosis not present

## 2022-11-07 DIAGNOSIS — Z7982 Long term (current) use of aspirin: Secondary | ICD-10-CM | POA: Diagnosis not present

## 2022-11-07 DIAGNOSIS — M1711 Unilateral primary osteoarthritis, right knee: Secondary | ICD-10-CM | POA: Diagnosis not present

## 2022-11-07 LAB — BASIC METABOLIC PANEL
Anion gap: 10 (ref 5–15)
BUN: 23 mg/dL (ref 8–23)
CO2: 23 mmol/L (ref 22–32)
Calcium: 8.7 mg/dL — ABNORMAL LOW (ref 8.9–10.3)
Chloride: 103 mmol/L (ref 98–111)
Creatinine, Ser: 0.75 mg/dL (ref 0.44–1.00)
GFR, Estimated: >60 mL/min (ref >60)
Glucose, Bld: 151 mg/dL — ABNORMAL HIGH (ref 70–99)
Potassium: 4.3 mmol/L (ref 3.5–5.1)
Sodium: 136 mmol/L (ref 135–145)

## 2022-11-07 LAB — CBC
HCT: 37.4 % (ref 36.0–46.0)
Hemoglobin: 11.9 g/dL — ABNORMAL LOW (ref 12.0–15.0)
MCH: 31.7 pg (ref 26.0–34.0)
MCHC: 31.8 g/dL (ref 30.0–36.0)
MCV: 99.7 fL (ref 80.0–100.0)
Platelets: 191 10*3/uL (ref 150–400)
RBC: 3.75 MIL/uL — ABNORMAL LOW (ref 3.87–5.11)
RDW: 15.8 % — ABNORMAL HIGH (ref 11.5–15.5)
WBC: 11 10*3/uL — ABNORMAL HIGH (ref 4.0–10.5)
nRBC: 0 % (ref 0.0–0.2)

## 2022-11-07 MED ORDER — OXYCODONE HCL 5 MG PO TABS: 5.0000 mg | ORAL_TABLET | ORAL | 0 refills | Status: DC | PRN

## 2022-11-07 MED ORDER — ACETAMINOPHEN 500 MG PO TABS: 1000.0000 mg | ORAL_TABLET | Freq: Three times a day (TID) | ORAL | 0 refills | Status: AC | PRN

## 2022-11-07 MED ORDER — METHOCARBAMOL 500 MG PO TABS: 500.0000 mg | ORAL_TABLET | Freq: Three times a day (TID) | ORAL | 0 refills | Status: AC | PRN

## 2022-11-07 MED ORDER — OXYCODONE HCL 5 MG PO TABS: 5.0000 mg | ORAL_TABLET | ORAL | 0 refills | Status: AC | PRN

## 2022-11-07 MED ORDER — ASPIRIN EC 81 MG PO TBEC: 81.0000 mg | DELAYED_RELEASE_TABLET | Freq: Two times a day (BID) | ORAL | 0 refills | Status: AC

## 2022-11-07 MED ORDER — ONDANSETRON HCL 4 MG PO TABS: 4.0000 mg | ORAL_TABLET | Freq: Three times a day (TID) | ORAL | 0 refills | Status: AC | PRN

## 2022-11-07 MED ORDER — SENNA 8.6 MG PO TABS
1.0000 | ORAL_TABLET | Freq: Two times a day (BID) | ORAL | Status: DC
  Administered 2022-11-07 – 2022-11-08 (×2): 8.6 mg via ORAL
  Filled 2022-11-07 (×2): qty 1

## 2022-11-07 NOTE — Progress Notes (Signed)
Physical Therapy Treatment Patient Details Name: [name] MRN: [id number] DOB: [birth year] Today's Date: 11/07/2022   History of Present Illness Pt is a 62yo female presenting s/p R-TKA on 11/06/22. PMH: hx of Hodkin's Lymphoma, HTN, L-TKA 2018    PT Comments    Pt is progressing with mobility. She tolerated session well. Plan is for possible d/c home on tomorrow if medically cleared.   Recommendations for follow up therapy are one component of a multi-disciplinary discharge planning process, led by the attending physician.  Recommendations may be updated based on patient status, additional functional criteria and insurance authorization.  Follow Up Recommendations  Follow physician's recommendations for discharge plan and follow up therapies     Assistance Recommended at Discharge Intermittent Supervision/Assistance  Patient can return home with the following A little help with walking and/or transfers;A little help with bathing/dressing/bathroom;Assistance with cooking/housework;Help with stairs or ramp for entrance;Assist for transportation   Equipment Recommendations  None recommended by PT    Recommendations for Other Services       Precautions / Restrictions Precautions Precautions: Fall;Knee Precaution Comments: no pillow under the knee Restrictions Weight Bearing Restrictions: No Other Position/Activity Restrictions: wbat     Mobility  Bed Mobility               General bed mobility comments: oob in recliner    Transfers Overall transfer level: Needs assistance Equipment used: Rolling walker (2 wheels) Transfers: Sit to/from Stand Sit to Stand: Supervision           General transfer comment: Min guard for safety. Increased time.    Ambulation/Gait Ambulation/Gait assistance: Min guard Gait Distance (Feet): 85 Feet Assistive device: Rolling walker (2 wheels) Gait Pattern/deviations: Step-through pattern, Decreased stride length       General  Gait Details: Min guard for safety. No LOB with RW use. Pt tolerated distance well.   Stairs             Wheelchair Mobility    Modified Rankin (Stroke Patients Only)       Balance Overall balance assessment: Needs assistance         Standing balance support: Reliant on assistive device for balance, During functional activity, Bilateral upper extremity supported Standing balance-Leahy Scale: Fair                              Cognition Arousal/Alertness: Awake/alert Behavior During Therapy: WFL for tasks assessed/performed Overall Cognitive Status: Within Functional Limits for tasks assessed                                          Exercises Total Joint Exercises Ankle Circles/Pumps: AROM, Both, 10 reps Quad Sets: AROM, Both, 10 reps Hip ABduction/ADduction: AROM, Right, 10 reps Long Arc Quad: AROM, Right, 5 reps Knee Flexion: AAROM, Right, 10 reps, Seated    General Comments        Pertinent Vitals/Pain Pain Assessment Pain Assessment: 0-10 Pain Score: 5  Pain Location: right knee/thigh Pain Descriptors / Indicators: Discomfort, Sore, Operative site guarding Pain Intervention(s): Monitored during session, Ice applied    Home Living                          Prior Function            PT Goals (  current goals can now be found in the care plan section) Progress towards PT goals: Progressing toward goals    Frequency    7X/week      PT Plan Current plan remains appropriate    Co-evaluation              AM-PAC PT "6 Clicks" Mobility   Outcome Measure  Help needed turning from your back to your side while in a flat bed without using bedrails?: None Help needed moving from lying on your back to sitting on the side of a flat bed without using bedrails?: None Help needed moving to and from a bed to a chair (including a wheelchair)?: None Help needed standing up from a chair using your arms (e.g.,  wheelchair or bedside chair)?: None Help needed to walk in hospital room?: A Little Help needed climbing 3-5 steps with a railing? : A Little 6 Click Score: 22    End of Session Equipment Utilized During Treatment: Gait belt Activity Tolerance: Patient tolerated treatment well Patient left: in chair;with call bell/phone within reach;with family/visitor present   PT Visit Diagnosis: Pain;Difficulty in walking, not elsewhere classified (R26.2) Pain - Right/Left: Right Pain - part of body: Knee     Time: 6468-0321 PT Time Calculation (min) (ACUTE ONLY): 15 min  Charges:  $Gait Training: 8-22 mins                         Doreatha Massed, PT Acute Rehabilitation  Office: [phone]

## 2022-11-07 NOTE — TOC Transition Note (Signed)
Transition of Care [facility]) - CM/SW Discharge Note  Patient Details  Name: [name] MRN: [id number] Date of Birth: 08/06/1961  Transition of Care [facility]) CM/SW Contact:  Sherie Don, LCSW Phone Number: 11/07/2022, 11:45 AM  Clinical Narrative: Patient is expected to discharge home after working with PT. CSW met with patient and her family to confirm discharge plan. Patient will go home with OPPT in [place], [place]. Patient has a rolling walker, shower chair, cane, and raised toilet seat at home so there are no DME needs at this time. TOC signing off.  Final next level of care: OP Rehab Barriers to Discharge: No Barriers Identified  Patient Goals and CMS Choice Choice offered to / list presented to : NA  Discharge Plan and Services Additional resources added to the After Visit Summary for         DME Arranged: N/A DME Agency: NA  Social Determinants of Health (SDOH) Interventions [place]: No Food Insecurity (11/06/2022)  Housing: Low Risk  (11/06/2022)  Transportation Needs: No Transportation Needs (11/06/2022)  Utilities: Not At Risk (11/06/2022)  Tobacco Use: Medium Risk (11/06/2022)   Readmission Risk Interventions     No data to display

## 2022-11-07 NOTE — Progress Notes (Signed)
     Subjective: Patient reports pain as mild.  Did well with PT yesterday. Is passing gas. Discussed plan for aggressive bowel regimen given hx of bowel obstruction after her other knee was replaced back in 2018. Currently ordered for miralax and senna BID. Will continue to monitor and make sure she has a BM before discharge given her hx.  Objective:   VITALS:   Vitals:   11/06/22 1900 11/06/22 2201 11/07/22 0112 11/07/22 0549  BP:  126/62 123/63 129/66  Pulse:  92 94 88  Resp:  '17 16 16  '$ Temp:  98.1 F (36.7 C) 98.9 F (37.2 C) 98.2 F (36.8 C)  TempSrc:  Oral Oral Oral  SpO2:  96% 93% 97%  Weight: 96.3 kg     Height: '5\' 4"'$  (1.626 m)       Sensation intact distally Intact pulses distally Dorsiflexion/Plantar flexion intact Incision: dressing C/D/I Compartment soft   Lab Results  Component Value Date   WBC 11.0 (H) 11/07/2022   HGB 11.9 (L) 11/07/2022   HCT 37.4 11/07/2022   MCV 99.7 11/07/2022   PLT 191 11/07/2022   BMET    Component Value Date/Time   NA 136 11/07/2022 0343   K 4.3 11/07/2022 0343   CL 103 11/07/2022 0343   CO2 23 11/07/2022 0343   GLUCOSE 151 (H) 11/07/2022 0343   BUN 23 11/07/2022 0343   CREATININE 0.75 11/07/2022 0343   CALCIUM 8.7 (L) 11/07/2022 0343   GFRNONAA >60 11/07/2022 0343    Xray: post op xrays show TKA components in good position no advese features  Assessment/Plan: 1 Day Post-Op   Principal Problem:   Localized osteoarthritis of right knee  S/p R TKA 11/06/22  Post op recs: WB: WBAT Abx: vanc and cipro given cephalosporin allergy Imaging: PACU xrays DVT prophylaxis: Aspirin '81mg'$  BID x4 weeks Follow up: 2 weeks after surgery for a wound check with Dr. Zachery Dakins at [facility].  Address: [address] [place], [place], [place] [address]  Office Phone: [phone]   Charlies Constable, MD Orthopaedic Surgery    [name] 11/07/2022, 8:21 AM   Charlies Constable, MD  Contact  information:   [phone] 7am-5pm epic message Dr. Zachery Dakins, or call office for patient follow up: [phone] After hours and holidays please check [email or web address] for group call information for [facility]

## 2022-11-07 NOTE — Plan of Care (Signed)
  Problem: Education: Goal: Knowledge of the prescribed therapeutic regimen will improve Outcome: Progressing   Problem: Activity: Goal: Ability to avoid complications of mobility impairment will improve Outcome: Progressing Goal: Range of joint motion will improve Outcome: Progressing   Problem: Pain Management: Goal: Pain level will decrease with appropriate interventions Outcome: Progressing   Problem: Activity: Goal: Risk for activity intolerance will decrease Outcome: Progressing

## 2022-11-07 NOTE — Anesthesia Postprocedure Evaluation (Signed)
Anesthesia Post Note  Patient: [name]  Procedure(s) Performed: TOTAL KNEE ARTHROPLASTY (Right: Knee)     Patient location during evaluation: PACU Anesthesia Type: Spinal Level of consciousness: awake, awake and alert and oriented Pain management: pain level controlled Vital Signs Assessment: post-procedure vital signs reviewed and stable Respiratory status: spontaneous breathing, nonlabored ventilation and respiratory function stable Cardiovascular status: blood pressure returned to baseline and stable Postop Assessment: no headache, no backache, spinal receding and no apparent nausea or vomiting Anesthetic complications: no   No notable events documented.  Last Vitals:  Vitals:   11/07/22 0112 11/07/22 0549  BP: 123/63 129/66  Pulse: 94 88  Resp: 16 16  Temp: 37.2 C 36.8 C  SpO2: 93% 97%    Last Pain:  Vitals:   11/07/22 0549  TempSrc: Oral  PainSc:                  [name]

## 2022-11-07 NOTE — Plan of Care (Signed)
  Problem: Pain Management: Goal: Pain level will decrease with appropriate interventions Outcome: Progressing   Problem: Activity: Goal: Ability to avoid complications of mobility impairment will improve Outcome: Progressing   Problem: Pain Managment: Goal: General experience of comfort will improve Outcome: Progressing

## 2022-11-07 NOTE — Progress Notes (Signed)
Physical Therapy Treatment Patient Details Name: [name] MRN: [id number] DOB: 1961/08/05 Today's Date: 11/07/2022   History of Present Illness Pt is a 62yo female presenting s/p R-TKA on 11/06/22. PMH: hx of Hodkin's Lymphoma, HTN, L-TKA 2018    PT Comments    Progressing with mobility. Moderate pain with activity. Will continue to follow and progress activity as tolerated.    Recommendations for follow up therapy are one component of a multi-disciplinary discharge planning process, led by the attending physician.  Recommendations may be updated based on patient status, additional functional criteria and insurance authorization.  Follow Up Recommendations  Follow physician's recommendations for discharge plan and follow up therapies     Assistance Recommended at Discharge Intermittent Supervision/Assistance  Patient can return home with the following A little help with walking and/or transfers;A little help with bathing/dressing/bathroom;Assistance with cooking/housework;Help with stairs or ramp for entrance;Assist for transportation   Equipment Recommendations  None recommended by PT    Recommendations for Other Services       Precautions / Restrictions Precautions Precautions: Fall;Knee Restrictions Weight Bearing Restrictions: No Other Position/Activity Restrictions: wbat     Mobility  Bed Mobility               General bed mobility comments: oob in recliner    Transfers Overall transfer level: Needs assistance Equipment used: Rolling walker (2 wheels) Transfers: Sit to/from Stand Sit to Stand: Min guard           General transfer comment: Min guard for safety. Increased time.    Ambulation/Gait Ambulation/Gait assistance: Min guard Gait Distance (Feet): 60 Feet Assistive device: Rolling walker (2 wheels) Gait Pattern/deviations: Step-to pattern, Decreased stride length       General Gait Details: Increased pain with increased step/stride  length. Pt reports ligthheadedness but she was able to complete the distance.   Stairs             Wheelchair Mobility    Modified Rankin (Stroke Patients Only)       Balance Overall balance assessment: Needs assistance         Standing balance support: Reliant on assistive device for balance, During functional activity, Bilateral upper extremity supported Standing balance-Leahy Scale: Fair                              Cognition Arousal/Alertness: Awake/alert Behavior During Therapy: WFL for tasks assessed/performed Overall Cognitive Status: Within Functional Limits for tasks assessed                                          Exercises      General Comments        Pertinent Vitals/Pain Pain Assessment Pain Assessment: 0-10 Pain Score: 7  Pain Location: right knee Pain Descriptors / Indicators: Operative site guarding, Discomfort, Sore Pain Intervention(s): Limited activity within patient's tolerance, Monitored during session, Ice applied    Home Living                          Prior Function            PT Goals (current goals can now be found in the care plan section) Progress towards PT goals: Progressing toward goals    Frequency    7X/week      PT Plan  Current plan remains appropriate    Co-evaluation              AM-PAC PT "6 Clicks" Mobility   Outcome Measure  Help needed turning from your back to your side while in a flat bed without using bedrails?: None Help needed moving from lying on your back to sitting on the side of a flat bed without using bedrails?: A Little Help needed moving to and from a bed to a chair (including a wheelchair)?: A Little Help needed standing up from a chair using your arms (e.g., wheelchair or bedside chair)?: A Little Help needed to walk in hospital room?: A Little Help needed climbing 3-5 steps with a railing? : A Little 6 Click Score: 19    End of  Session Equipment Utilized During Treatment: Gait belt Activity Tolerance: Patient tolerated treatment well;Patient limited by pain Patient left: in chair;with call bell/phone within reach;with family/visitor present;with chair alarm set   PT Visit Diagnosis: Pain;Difficulty in walking, not elsewhere classified (R26.2) Pain - Right/Left: Right Pain - part of body: Knee     Time: 3254-9826 PT Time Calculation (min) (ACUTE ONLY): 12 min  Charges:  $Gait Training: 8-22 mins                         Doreatha Massed, PT Acute Rehabilitation  Office: [phone]

## 2022-11-08 ENCOUNTER — Encounter (HOSPITAL_COMMUNITY): Payer: Self-pay | Admitting: Orthopedic Surgery

## 2022-11-08 DIAGNOSIS — Z7982 Long term (current) use of aspirin: Secondary | ICD-10-CM | POA: Diagnosis not present

## 2022-11-08 DIAGNOSIS — Z87891 Personal history of nicotine dependence: Secondary | ICD-10-CM | POA: Diagnosis not present

## 2022-11-08 DIAGNOSIS — Z79899 Other long term (current) drug therapy: Secondary | ICD-10-CM | POA: Diagnosis not present

## 2022-11-08 DIAGNOSIS — M1711 Unilateral primary osteoarthritis, right knee: Secondary | ICD-10-CM | POA: Diagnosis not present

## 2022-11-08 DIAGNOSIS — M25562 Pain in left knee: Secondary | ICD-10-CM | POA: Diagnosis not present

## 2022-11-08 DIAGNOSIS — Z8571 Personal history of Hodgkin lymphoma: Secondary | ICD-10-CM | POA: Diagnosis not present

## 2022-11-08 DIAGNOSIS — Z96652 Presence of left artificial knee joint: Secondary | ICD-10-CM | POA: Diagnosis not present

## 2022-11-08 DIAGNOSIS — I10 Essential (primary) hypertension: Secondary | ICD-10-CM | POA: Diagnosis not present

## 2022-11-08 DIAGNOSIS — E039 Hypothyroidism, unspecified: Secondary | ICD-10-CM | POA: Diagnosis not present

## 2022-11-08 LAB — CBC
HCT: 33.3 % — ABNORMAL LOW (ref 36.0–46.0)
Hemoglobin: 10.8 g/dL — ABNORMAL LOW (ref 12.0–15.0)
MCH: 32.6 pg (ref 26.0–34.0)
MCHC: 32.4 g/dL (ref 30.0–36.0)
MCV: 100.6 fL — ABNORMAL HIGH (ref 80.0–100.0)
Platelets: 187 10*3/uL (ref 150–400)
RBC: 3.31 MIL/uL — ABNORMAL LOW (ref 3.87–5.11)
RDW: 16.2 % — ABNORMAL HIGH (ref 11.5–15.5)
WBC: 14.2 10*3/uL — ABNORMAL HIGH (ref 4.0–10.5)
nRBC: 0 % (ref 0.0–0.2)

## 2022-11-08 MED ORDER — DICLOFENAC SODIUM 75 MG PO TBEC
75.0000 mg | DELAYED_RELEASE_TABLET | Freq: Two times a day (BID) | ORAL | Status: DC
  Administered 2022-11-08: 75 mg via ORAL
  Filled 2022-11-08: qty 1

## 2022-11-08 NOTE — Plan of Care (Signed)
  Problem: Education: Goal: Knowledge of the prescribed therapeutic regimen will improve Outcome: Progressing   Problem: Activity: Goal: Ability to avoid complications of mobility impairment will improve Outcome: Progressing   Problem: Education: Goal: Knowledge of General Education information will improve Description: Including pain rating scale, medication(s)/side effects and non-pharmacologic comfort measures Outcome: Progressing

## 2022-11-08 NOTE — Progress Notes (Signed)
Physical Therapy Treatment Patient Details Name: [name] MRN: [id number] DOB: [birth year] Today's Date: 11/08/2022   History of Present Illness Pt is a 62yo female presenting s/p R-TKA on 11/06/22. PMH: hx of Hodkin's Lymphoma, HTN, L-TKA 2018    PT Comments    Progressing with mobility. Pt reports more soreness today-pain controlled. Reviewed/practiced gait and stair training-husband present to observe. Pt reports she has an OP PT appt schedule for later today. All PT education completed.     Recommendations for follow up therapy are one component of a multi-disciplinary discharge planning process, led by the attending physician.  Recommendations may be updated based on patient status, additional functional criteria and insurance authorization.  Follow Up Recommendations  Follow physician's recommendations for discharge plan and follow up therapies     Assistance Recommended at Discharge Intermittent Supervision/Assistance  Patient can return home with the following A little help with walking and/or transfers;A little help with bathing/dressing/bathroom;Assistance with cooking/housework;Help with stairs or ramp for entrance;Assist for transportation   Equipment Recommendations  None recommended by PT    Recommendations for Other Services       Precautions / Restrictions Precautions Precautions: Fall;Knee Restrictions Weight Bearing Restrictions: No Other Position/Activity Restrictions: wbat     Mobility  Bed Mobility               General bed mobility comments: oob in recliner    Transfers Overall transfer level: Needs assistance Equipment used: Rolling walker (2 wheels) Transfers: Sit to/from Stand Sit to Stand: Supervision                Ambulation/Gait Ambulation/Gait assistance: Supervision Gait Distance (Feet): 75 Feet Assistive device: Rolling walker (2 wheels) Gait Pattern/deviations: Step-through pattern, Decreased stride length            Stairs Stairs: Yes Stairs assistance: Min guard Stair Management: Step to pattern, Forwards, Two rails Number of Stairs: 2 General stair comments: up and over portable stairs x 1. cues for safety, technique, sequence.   Wheelchair Mobility    Modified Rankin (Stroke Patients Only)       Balance Overall balance assessment: Needs assistance         Standing balance support: Reliant on assistive device for balance, During functional activity, Bilateral upper extremity supported Standing balance-Leahy Scale: Fair                              Cognition Arousal/Alertness: Awake/alert Behavior During Therapy: WFL for tasks assessed/performed Overall Cognitive Status: Within Functional Limits for tasks assessed                                          Exercises      General Comments        Pertinent Vitals/Pain Pain Assessment Pain Assessment: Faces Faces Pain Scale: Hurts even more Pain Location: right knee/thigh Pain Descriptors / Indicators: Discomfort, Sore, Operative site guarding Pain Intervention(s): Monitored during session, Ice applied    Home Living                          Prior Function            PT Goals (current goals can now be found in the care plan section) Progress towards PT goals: Progressing toward goals    Frequency  7X/week      PT Plan Current plan remains appropriate    Co-evaluation              AM-PAC PT "6 Clicks" Mobility   Outcome Measure  Help needed turning from your back to your side while in a flat bed without using bedrails?: None Help needed moving from lying on your back to sitting on the side of a flat bed without using bedrails?: None Help needed moving to and from a bed to a chair (including a wheelchair)?: None Help needed standing up from a chair using your arms (e.g., wheelchair or bedside chair)?: None Help needed to walk in hospital room?: A  Little Help needed climbing 3-5 steps with a railing? : A Little 6 Click Score: 22    End of Session Equipment Utilized During Treatment: Gait belt Activity Tolerance: Patient tolerated treatment well Patient left: in chair;with call bell/phone within reach;with family/visitor present   PT Visit Diagnosis: Pain;Difficulty in walking, not elsewhere classified (R26.2) Pain - Right/Left: Right Pain - part of body: Knee     Time: 0945-1001 PT Time Calculation (min) (ACUTE ONLY): 16 min  Charges:  $Gait Training: 8-22 mins              Doreatha Massed, PT Acute Rehabilitation  Office: [phone]

## 2022-11-08 NOTE — Progress Notes (Signed)
     Subjective: Patient reports pain as mild.  Did well with PT yesterday. Had 2 Bms last night and no abdominal pain. Feels good about going home today. Denies distal n/t. No acute issues. Discussed continuing bowel regimen at home especially while she is taking narcotics for the post op pain.  Objective:   VITALS:   Vitals:   11/07/22 1419 11/07/22 1714 11/07/22 2156 11/08/22 0631  BP: 121/62 (!) 145/56 121/60 (!) 124/59  Pulse: 78 89 79 75  Resp: '18 20 17 17  '$ Temp: 98.3 F (36.8 C) 98.2 F (36.8 C) 98.2 F (36.8 C) 98 F (36.7 C)  TempSrc: Oral Oral Oral Oral  SpO2: 94% 96% 94% 96%  Weight:      Height:        Sensation intact distally Intact pulses distally Dorsiflexion/Plantar flexion intact Incision: dressing C/D/I Compartment soft   Lab Results  Component Value Date   WBC 14.2 (H) 11/08/2022   HGB 10.8 (L) 11/08/2022   HCT 33.3 (L) 11/08/2022   MCV 100.6 (H) 11/08/2022   PLT 187 11/08/2022   BMET    Component Value Date/Time   NA 136 11/07/2022 0343   K 4.3 11/07/2022 0343   CL 103 11/07/2022 0343   CO2 23 11/07/2022 0343   GLUCOSE 151 (H) 11/07/2022 0343   BUN 23 11/07/2022 0343   CREATININE 0.75 11/07/2022 0343   CALCIUM 8.7 (L) 11/07/2022 0343   GFRNONAA >60 11/07/2022 0343    Xray: post op xrays show TKA components in good position no advese features  Assessment/Plan: 2 Days Post-Op   Principal Problem:   Localized osteoarthritis of right knee  S/p R TKA 11/06/22  Post op recs: WB: WBAT Abx: vanc and cipro given cephalosporin allergy Imaging: PACU xrays DVT prophylaxis: Aspirin '81mg'$  BID x4 weeks Follow up: 2 weeks after surgery for Farmer wound check with Dr. Zachery Dakins at [facility].  Address: [address] [place], [place], [place] [address]  Office Phone: [phone]   Charlies Constable, MD Orthopaedic Surgery    [name] [place] 11/08/2022, 6:37 AM   Charlies Constable, MD  Contact information:   [phone]  7am-5pm epic message Dr. Zachery Dakins, or call office for patient follow up: [phone] After hours and holidays please check [email or web address] for group call information for [facility]

## 2022-11-08 NOTE — Discharge Summary (Signed)
Physician Discharge Summary  Patient ID: [name] MRN: [id number] DOB/AGE: 1961/03/21 62 y.o.  Admit date: 11/06/2022 Discharge date: 11/08/2022  Admission Diagnoses:  Localized osteoarthritis of right knee  Discharge Diagnoses:  Principal Problem:   Localized osteoarthritis of right knee   Past Medical History:  Diagnosis Date   Anxiety    Cancer ([place])    hodgkins lymphoma, 1992 Diagnosed   H/O radioactive iodine thyroid ablation 1980's   Heart murmur    History of kidney stones    Hypertension    Hypothyroidism    "thyroid is dead"   Pneumonia    Primary localized osteoarthritis of left knee    S/P total knee replacement using cement, left 03/19/2017    Surgeries: Procedure(s): TOTAL KNEE ARTHROPLASTY on 11/06/2022   Consultants (if any):   Discharged Condition: Improved  Hospital Course: [name] is an 62 y.o. female who was admitted 11/06/2022 with a diagnosis of Localized osteoarthritis of right knee and went to the operating room on 11/06/2022 and underwent the above named procedures.    She was given perioperative antibiotics:  Anti-infectives (From admission, onward)    Start     Dose/Rate Route Frequency Ordered Stop   11/06/22 2200  vancomycin (VANCOCIN) IVPB 1000 mg/200 mL premix        1,000 mg 200 mL/hr over 60 Minutes Intravenous Every 12 hours 11/06/22 1559 11/06/22 2245   11/06/22 2000  ciprofloxacin (CIPRO) IVPB 400 mg        400 mg 200 mL/hr over 60 Minutes Intravenous Every 8 hours 11/06/22 1559 11/07/22 0500   11/06/22 1100  vancomycin (VANCOCIN) IVPB 1000 mg/200 mL premix        1,000 mg 200 mL/hr over 60 Minutes Intravenous  Once 11/06/22 1055 11/06/22 1242   11/06/22 1100  ciprofloxacin (CIPRO) IVPB 400 mg        400 mg 200 mL/hr over 60 Minutes Intravenous  Once 11/06/22 1055 11/06/22 1227   11/06/22 1045  ceFAZolin (ANCEF) IVPB 2g/100 mL premix  Status:  Discontinued        2 g 200 mL/hr over 30 Minutes Intravenous On call to O.R.  11/06/22 1044 11/06/22 1055     .  She was given sequential compression devices, early ambulation, and aspirin for DVT prophylaxis.  She benefited maximally from the hospital stay and there were no complications.    Recent vital signs:  Vitals:   11/07/22 2156 11/08/22 0631  BP: 121/60 (!) 124/59  Pulse: 79 75  Resp: 17 17  Temp: 98.2 F (36.8 C) 98 F (36.7 C)  SpO2: 94% 96%    Recent laboratory studies:  Lab Results  Component Value Date   HGB 10.8 (L) 11/08/2022   HGB 11.9 (L) 11/07/2022   HGB 13.6 10/25/2022   Lab Results  Component Value Date   WBC 14.2 (H) 11/08/2022   PLT 187 11/08/2022   Lab Results  Component Value Date   INR 1.08 03/16/2017   Lab Results  Component Value Date   NA 136 11/07/2022   K 4.3 11/07/2022   CL 103 11/07/2022   CO2 23 11/07/2022   BUN 23 11/07/2022   CREATININE 0.75 11/07/2022   GLUCOSE 151 (H) 11/07/2022    Discharge Medications:   Allergies as of 11/08/2022       Reactions   Contrast Media [iodinated Contrast Media] Nausea And Vomiting   Iodine-131 Nausea And Vomiting   Keflex [cephalexin] Rash  Medication List     TAKE these medications    acetaminophen 650 MG CR tablet Commonly known as: TYLENOL Take 650 mg by mouth at bedtime. What changed: Another medication with the same name was added. Make sure you understand how and when to take each.   acetaminophen 500 MG tablet Commonly known as: TYLENOL Take 2 tablets (1,000 mg total) by mouth every 8 (eight) hours as needed. What changed: You were already taking a medication with the same name, and this prescription was added. Make sure you understand how and when to take each.   albuterol 108 (90 Base) MCG/ACT inhaler Commonly known as: VENTOLIN HFA Inhale 2 puffs into the lungs every 4 (four) hours as needed for wheezing or shortness of breath.   amLODipine 10 MG tablet Commonly known as: NORVASC Take 1 tablet (10 mg total) by mouth every  evening. What changed: when to take this   aspirin EC 81 MG tablet Take 1 tablet (81 mg total) by mouth 2 (two) times daily. What changed: when to take this   benazepril-hydrochlorthiazide 20-25 MG tablet Commonly known as: LOTENSIN HCT Take 1 tablet by mouth in the morning.   diclofenac 75 MG EC tablet Commonly known as: VOLTAREN Take 75 mg by mouth 2 (two) times daily.   diphenhydrAMINE 25 mg capsule Commonly known as: BENADRYL Take 25 mg by mouth at bedtime.   escitalopram 20 MG tablet Commonly known as: LEXAPRO Take 20 mg by mouth at bedtime.   fexofenadine 180 MG tablet Commonly known as: ALLEGRA Take 180 mg by mouth in the morning.   FISH OIL PO Take 1 capsule by mouth in the morning and at bedtime.   gabapentin 300 MG capsule Commonly known as: NEURONTIN Take 300 mg by mouth at bedtime.   levothyroxine 75 MCG tablet Commonly known as: SYNTHROID Take 75 mcg by mouth daily before breakfast.   Lubricant Eye Drops 0.4-0.3 % Soln Generic drug: Polyethyl Glycol-Propyl Glycol Place 1-2 drops into both eyes 3 (three) times daily as needed (dry/irritated eyes.).   methocarbamol 500 MG tablet Commonly known as: ROBAXIN Take 1 tablet (500 mg total) by mouth every 8 (eight) hours as needed for up to 10 days for muscle spasms.   multivitamin with minerals Tabs tablet Take 1 tablet by mouth in the morning.   ondansetron 4 MG tablet Commonly known as: Zofran Take 1 tablet (4 mg total) by mouth every 8 (eight) hours as needed for up to 14 days for nausea or vomiting.   oxyCODONE 5 MG immediate release tablet Commonly known as: Roxicodone Take 1 tablet (5 mg total) by mouth every 4 (four) hours as needed for up to 7 days for severe pain or moderate pain.   polyethylene glycol 17 g packet Commonly known as: MIRALAX / GLYCOLAX Take 17 g by mouth daily.   rosuvastatin 20 MG tablet Commonly known as: CRESTOR Take 20 mg by mouth every evening.        Diagnostic  Studies: DG Knee Right Port  Result Date: 11/06/2022 CLINICAL DATA:  Postoperative state. EXAM: PORTABLE RIGHT KNEE - 1-2 VIEW COMPARISON:  Right knee radiographs 12/08/2009 FINDINGS: Interval total right knee arthroplasty. No perihardware lucency is seen to indicate hardware failure or loosening. Expected postoperative intra-articular and subcutaneous air. No acute fracture or dislocation. IMPRESSION: Interval total right knee arthroplasty without evidence of hardware failure. Electronically Signed   By: Yvonne Kendall M.D.   On: 11/06/2022 14:58    Disposition: Discharge disposition: 01-Home  or Self Care       Discharge Instructions     Call MD / Call 911   Complete by: As directed    If you experience chest pain or shortness of breath, CALL 911 and be transported to the hospital emergency room.  If you develope a fever above 101 F, pus (Farmer drainage) or increased drainage or redness at the wound, or calf pain, call your surgeon's office.   Constipation Prevention   Complete by: As directed    Drink plenty of fluids.  Prune juice may be helpful.  You may use a stool softener, such as Colace (over the counter) 100 mg twice a day.  Use MiraLax (over the counter) for constipation as needed.   Diet - low sodium heart healthy   Complete by: As directed    Do not put a pillow under the knee. Place it under the heel.   Complete by: As directed    Increase activity slowly as tolerated   Complete by: As directed    Post-operative opioid taper instructions:   Complete by: As directed    POST-OPERATIVE OPIOID TAPER INSTRUCTIONS: It is important to wean off of your opioid medication as soon as possible. If you do not need pain medication after your surgery it is ok to stop day one. Opioids include: Codeine, Hydrocodone(Norco, Vicodin), Oxycodone(Percocet, oxycontin) and hydromorphone amongst others.  Long term and even short term use of opiods can cause: Increased pain  response Dependence Constipation Depression Respiratory depression And more.  Withdrawal symptoms can include Flu like symptoms Nausea, vomiting And more Techniques to manage these symptoms Hydrate well Eat regular healthy meals Stay active Use relaxation techniques(deep breathing, meditating, yoga) Do Not substitute Alcohol to help with tapering If you have been on opioids for less than two weeks and do not have pain than it is ok to stop all together.  Plan to wean off of opioids This plan should start within one week post op of your joint replacement. Maintain the same interval or time between taking each dose and first decrease the dose.  Cut the total daily intake of opioids by one tablet each day Next start to increase the time between doses. The last dose that should be eliminated is the evening dose.               Discharge Instructions      INSTRUCTIONS AFTER JOINT REPLACEMENT   Remove items at home which could result in a fall. This includes throw rugs or furniture in walking pathways ICE to the affected joint every three hours while awake for 30 minutes at a time, for at least the first 3-5 days, and then as needed for pain and swelling.  Continue to use ice for pain and swelling. You may notice swelling that will progress down to the foot and ankle.  This is normal after surgery.  Elevate your leg when you are not up walking on it.   Continue to use the breathing machine you got in the hospital (incentive spirometer) which will help keep your temperature down.  It is common for your temperature to cycle up and down following surgery, especially at night when you are not up moving around and exerting yourself.  The breathing machine keeps your lungs expanded and your temperature down.   DIET:  As you were doing prior to hospitalization, we recommend a well-balanced diet.  DRESSING / WOUND CARE / SHOWERING  Keep the surgical dressing until follow up.  The  dressing is water proof, so you can shower without any extra covering.  IF THE DRESSING FALLS OFF or the wound gets wet inside, change the dressing with sterile gauze.  Please use good hand washing techniques before changing the dressing.  Do not use any lotions or creams on the incision until instructed by your surgeon.    ACTIVITY  Increase activity slowly as tolerated, but follow the weight bearing instructions below.   No driving for 6 weeks or until further direction given by your physician.  You cannot drive while taking narcotics.  No lifting or carrying greater than 10 lbs. until further directed by your surgeon. Avoid periods of inactivity such as sitting longer than an hour when not asleep. This helps prevent blood clots.  You may return to work once you are authorized by your doctor.     WEIGHT BEARING   Weight bearing as tolerated with assist device (walker, cane, etc) as directed, use it as long as suggested by your surgeon or therapist, typically at least 4-6 weeks.   EXERCISES  Results after joint replacement surgery are often greatly improved when you follow the exercise, range of motion and muscle strengthening exercises prescribed by your doctor. Safety measures are also important to protect the joint from further injury. Any time any of these exercises cause you to have increased pain or swelling, decrease what you are doing until you are comfortable again and then slowly increase them. If you have problems or questions, call your caregiver or physical therapist for advice.   Rehabilitation is important following a joint replacement. After just a few days of immobilization, the muscles of the leg can become weakened and shrink (atrophy).  These exercises are designed to build up the tone and strength of the thigh and leg muscles and to improve motion. Often times heat used for twenty to thirty minutes before working out will loosen up your tissues and help with improving the  range of motion but do not use heat for the first two weeks following surgery (sometimes heat can increase post-operative swelling).   These exercises can be done on a training (exercise) mat, on the floor, on a table or on a bed. Use whatever works the best and is most comfortable for you.    Use music or television while you are exercising so that the exercises are a pleasant break in your day. This will make your life better with the exercises acting as a break in your routine that you can look forward to.   Perform all exercises about fifteen times, three times per day or as directed.  You should exercise both the operative leg and the other leg as well.  Exercises include:   Quad Sets - Tighten up the muscle on the front of the thigh (Quad) and hold for 5-10 seconds.   Straight Leg Raises - With your knee straight (if you were given a brace, keep it on), lift the leg to 60 degrees, hold for 3 seconds, and slowly lower the leg.  Perform this exercise against resistance later as your leg gets stronger.  Leg Slides: Lying on your back, slowly slide your foot toward your buttocks, bending your knee up off the floor (only go as far as is comfortable). Then slowly slide your foot back down until your leg is flat on the floor again.  Angel Wings: Lying on your back spread your legs to the side as far apart as you can without causing discomfort.  Hamstring Strength:  Lying on your back, push your heel against the floor with your leg straight by tightening up the muscles of your buttocks.  Repeat, but this time bend your knee to a comfortable angle, and push your heel against the floor.  You may put a pillow under the heel to make it more comfortable if necessary.   A rehabilitation program following joint replacement surgery can speed recovery and prevent re-injury in the future due to weakened muscles. Contact your doctor or a physical therapist for more information on knee rehabilitation.     CONSTIPATION  Constipation is defined medically as fewer than three stools per week and severe constipation as less than one stool per week.  Even if you have a regular bowel pattern at home, your normal regimen is likely to be disrupted due to multiple reasons following surgery.  Combination of anesthesia, postoperative narcotics, change in appetite and fluid intake all can affect your bowels.   YOU MUST use at least one of the following options; they are listed in order of increasing strength to get the job done.  They are all available over the counter, and you may need to use some, POSSIBLY even all of these options:    Drink plenty of fluids (prune juice may be helpful) and high fiber foods Colace 100 mg by mouth twice a day  Senokot for constipation as directed and as needed Dulcolax (bisacodyl), take with full glass of water  Miralax (polyethylene glycol) once or twice a day as needed.  If you have tried all these things and are unable to have a bowel movement in the first 3-4 days after surgery call either your surgeon or your primary doctor.    If you experience loose stools or diarrhea, hold the medications until you stool forms back up.  If your symptoms do not get better within 1 week or if they get worse, check with your doctor.  If you experience "the worst abdominal pain ever" or develop nausea or vomiting, please contact the office immediately for further recommendations for treatment.   ITCHING:  If you experience itching with your medications, try taking only a single pain pill, or even half a pain pill at a time.  You can also use Benadryl over the counter for itching or also to help with sleep.   TED HOSE STOCKINGS:  Use stockings on both legs until for at least 2 weeks or as directed by physician office. They may be removed at night for sleeping.  MEDICATIONS:  See your medication summary on the "After Visit Summary" that nursing will review with you.  You may have some  home medications which will be placed on hold until you complete the course of blood thinner medication.  It is important for you to complete the blood thinner medication as prescribed.   Blood clot prevention (DVT Prophylaxis): After surgery you are at an increased risk for a blood clot. you were prescribed a blood thinner, Aspirin '81mg'$ , to be taken twice daily for a total of 4 weeks from surgery to help reduce your risk of getting a blood clot. This will help prevent a blood clot. Signs of a pulmonary embolus (blood clot in the lungs) include sudden short of breath, feeling lightheaded or dizzy, chest pain with a deep breath, rapid pulse rapid breathing. Signs of a blood clot in your arms or legs include new unexplained swelling and cramping, warm, red or darkened skin around the painful area. Please call the  office or 911 right away if these signs or symptoms develop.  PRECAUTIONS:  If you experience chest pain or shortness of breath - call 911 immediately for transfer to the hospital emergency department.   If you develop a fever greater that 101 F, purulent drainage from wound, increased redness or drainage from wound, foul odor from the wound/dressing, or calf pain - CONTACT YOUR SURGEON.                                                   FOLLOW-UP APPOINTMENTS:  If you do not already have a post-op appointment, please call the office for an appointment to be seen by your surgeon.  Guidelines for how soon to be seen are listed in your "After Visit Summary", but are typically between 2-3 weeks after surgery.  OTHER INSTRUCTIONS:   Knee Replacement:  Do not place pillow under knee, focus on keeping the knee straight while resting. DO NOT modify, tear, cut, or change the foam block in any way.  POST-OPERATIVE OPIOID TAPER INSTRUCTIONS: It is important to wean off of your opioid medication as soon as possible. If you do not need pain medication after your surgery it is ok to stop day one. Opioids  include: Codeine, Hydrocodone(Norco, Vicodin), Oxycodone(Percocet, oxycontin) and hydromorphone amongst others.  Long term and even short term use of opiods can cause: Increased pain response Dependence Constipation Depression Respiratory depression And more.  Withdrawal symptoms can include Flu like symptoms Nausea, vomiting And more Techniques to manage these symptoms Hydrate well Eat regular healthy meals Stay active Use relaxation techniques(deep breathing, meditating, yoga) Do Not substitute Alcohol to help with tapering If you have been on opioids for less than two weeks and do not have pain than it is ok to stop all together.  Plan to wean off of opioids This plan should start within one week post op of your joint replacement. Maintain the same interval or time between taking each dose and first decrease the dose.  Cut the total daily intake of opioids by one tablet each day Next start to increase the time between doses. The last dose that should be eliminated is the evening dose.   MAKE SURE YOU:  Understand these instructions.  Get help right away if you are not doing well or get worse.    Thank you for letting us be a part of your medical care team.  It is a privilege we respect greatly.  We hope these instructions will help you stay on track for a fast and full recovery!           Signed: Madoline Bhatt A Dandra Shambaugh 11/08/2022, 11:26 AM

## 2022-11-10 DIAGNOSIS — M25562 Pain in left knee: Secondary | ICD-10-CM | POA: Diagnosis not present

## 2022-11-16 DIAGNOSIS — M25562 Pain in left knee: Secondary | ICD-10-CM | POA: Diagnosis not present

## 2022-11-20 DIAGNOSIS — M25562 Pain in left knee: Secondary | ICD-10-CM | POA: Diagnosis not present

## 2022-11-21 DIAGNOSIS — M1711 Unilateral primary osteoarthritis, right knee: Secondary | ICD-10-CM | POA: Diagnosis not present

## 2022-11-24 DIAGNOSIS — M25562 Pain in left knee: Secondary | ICD-10-CM | POA: Diagnosis not present

## 2022-11-28 ENCOUNTER — Encounter: Admit: 2022-11-28 | Payer: PRIVATE HEALTH INSURANCE | Attending: Internal Medicine | Primary: Internal Medicine

## 2022-11-28 ENCOUNTER — Inpatient Hospital Stay: Admit: 2022-11-28 | Discharge: 2022-11-28 | Payer: PRIVATE HEALTH INSURANCE | Primary: Internal Medicine

## 2022-11-28 DIAGNOSIS — R300 Dysuria: Secondary | ICD-10-CM

## 2022-11-28 LAB — URINALYSIS WITH CULTURE REFLEX      (BH LMW YH)
BKR BILIRUBIN, UA: NEGATIVE
BKR BLOOD, UA: NEGATIVE
BKR GLUCOSE, UA: NEGATIVE
BKR KETONES, UA: NEGATIVE
BKR LEUKOCYTE ESTERASE, UA: NEGATIVE
BKR NITRITE, UA: NEGATIVE
BKR PH, UA: 6 (ref 5.5–7.5)
BKR PROTEIN, UA: NEGATIVE
BKR SPECIFIC GRAVITY, UA: 1.006 (ref 1.005–1.030)
BKR UROBILINOGEN, UA (MG/DL): <2 mg/dL (ref <=2.0)

## 2022-11-28 LAB — UA REFLEX CULTURE

## 2022-11-28 MED ORDER — NITROFURANTOIN MONOHYDRATE/MACROCRYSTALS 100 MG CAPSULE
100 | ORAL_CAPSULE | Freq: Two times a day (BID) | ORAL | 1 refills | 5.00000 days | Status: AC
Start: 2022-11-28 — End: 2023-04-19

## 2022-12-04 ENCOUNTER — Inpatient Hospital Stay: Admit: 2022-12-04 | Discharge: 2022-12-04 | Payer: PRIVATE HEALTH INSURANCE | Primary: Internal Medicine

## 2022-12-04 ENCOUNTER — Encounter: Admit: 2022-12-04 | Payer: PRIVATE HEALTH INSURANCE | Attending: Family | Primary: Internal Medicine

## 2022-12-04 ENCOUNTER — Encounter: Admit: 2022-12-04 | Payer: PRIVATE HEALTH INSURANCE | Attending: Internal Medicine | Primary: Internal Medicine

## 2022-12-04 DIAGNOSIS — R300 Dysuria: Secondary | ICD-10-CM

## 2022-12-04 LAB — URINALYSIS WITH CULTURE REFLEX      (BH LMW YH)
BKR BILIRUBIN, UA: NEGATIVE
BKR BLOOD, UA: NEGATIVE
BKR GLUCOSE, UA: NEGATIVE
BKR KETONES, UA: NEGATIVE
BKR LEUKOCYTE ESTERASE, UA: NEGATIVE
BKR NITRITE, UA: NEGATIVE
BKR PH, UA: 6.5 mg/dL (ref 5.5–7.5)
BKR PROTEIN, UA: NEGATIVE
BKR SPECIFIC GRAVITY, UA: 1.008 (ref 1.005–1.030)
BKR UROBILINOGEN, UA (MG/DL): <2 mg/dL (ref <=2.0)

## 2022-12-04 LAB — UA REFLEX CULTURE

## 2022-12-06 MED ORDER — MONTELUKAST 10 MG TABLET
10 | ORAL_TABLET | Freq: Every evening | ORAL | 1 refills | 90.00000 days | Status: AC
Start: 2022-12-06 — End: 2023-04-19

## 2022-12-21 DIAGNOSIS — M1711 Unilateral primary osteoarthritis, right knee: Secondary | ICD-10-CM | POA: Diagnosis not present

## 2022-12-22 ENCOUNTER — Encounter: Admit: 2022-12-22 | Payer: PRIVATE HEALTH INSURANCE | Attending: Family | Primary: Internal Medicine

## 2022-12-22 MED ORDER — MECLIZINE 25 MG TABLET
25 | ORAL_TABLET | Freq: Three times a day (TID) | ORAL | 1 refills | 10.00000 days | Status: AC | PRN
Start: 2022-12-22 — End: 2023-11-14

## 2023-01-15 ENCOUNTER — Encounter: Admit: 2023-01-15 | Payer: PRIVATE HEALTH INSURANCE | Attending: Internal Medicine | Primary: Internal Medicine

## 2023-01-15 MED ORDER — ALPRAZOLAM 0.5 MG TABLET
0.5 | ORAL_TABLET | Freq: Every evening | ORAL | 2 refills | 25.00000 days | Status: AC | PRN
Start: 2023-01-15 — End: 2023-04-09

## 2023-02-02 ENCOUNTER — Ambulatory Visit: Admit: 2023-02-02 | Payer: PRIVATE HEALTH INSURANCE | Attending: Obstetrics and Gynecology | Primary: Internal Medicine

## 2023-02-02 ENCOUNTER — Encounter: Admit: 2023-02-02 | Payer: PRIVATE HEALTH INSURANCE | Attending: Obstetrics and Gynecology | Primary: Internal Medicine

## 2023-02-02 ENCOUNTER — Encounter: Admit: 2023-02-02 | Payer: PRIVATE HEALTH INSURANCE | Primary: Internal Medicine

## 2023-02-02 DIAGNOSIS — Z01419 Encounter for gynecological examination (general) (routine) without abnormal findings: Secondary | ICD-10-CM

## 2023-02-02 DIAGNOSIS — R923 Dense breasts: Secondary | ICD-10-CM

## 2023-02-02 DIAGNOSIS — K648 Other hemorrhoids: Secondary | ICD-10-CM

## 2023-02-02 DIAGNOSIS — T7840XA Allergy, unspecified, initial encounter: Secondary | ICD-10-CM

## 2023-02-02 DIAGNOSIS — D369 Benign neoplasm, unspecified site: Secondary | ICD-10-CM

## 2023-02-02 DIAGNOSIS — G2581 Restless legs syndrome: Secondary | ICD-10-CM

## 2023-02-02 DIAGNOSIS — K5790 Diverticulosis of intestine, part unspecified, without perforation or abscess without bleeding: Secondary | ICD-10-CM

## 2023-02-02 DIAGNOSIS — E785 Hyperlipidemia, unspecified: Secondary | ICD-10-CM

## 2023-02-02 NOTE — Progress Notes
ZOX:WRUEA Kristianne Barsoum is a 62 y.o. G12P1102 female who presents to this practice for an annual exam. She has no complaintsNo LMP recorded. Patient is postmenopausal.Patient HistoryProblem List: has Hyperlipidemia and Colon adenoma on their problem list. Past Surgical History: has a past surgical history that includes b/l bunionectomy; carpal tunnel ; b/l; Cesarean section; repair ? perforated ulcer at birth; Breast biopsy (Right, 05/2013); and Colonoscopy (09/14/2022).Past Medical History: has a past medical history of Allergy, Diverticulosis, Hemorrhoids, internal, Hyperlipidemia, Restless legs, and Tubular adenoma (09/14/2022).Family History: family history includes Coronary Artery Disease in her brother and mother; Heart attack in her brother; Heart disease in her brother, father, and mother; High cholesterol in her mother; Hypertension in her mother; Kidney failure in her father; Lung cancer in her father; No Known Problems in her son and son; Throat cancer in her father.Allergies:is allergic to kiwi and sulfa (sulfonamide antibiotics). Medications: Current Outpatient Medications:   ALPRAZolam, 0.5 mg, Oral, Nightly PRN  atorvastatin, 10 mg, Oral, Daily  calcium carbonate-vitamin D3, Take by mouth.  meclizine, 25 mg, Oral, TID PRN  multivitamin, 1 capsule, Oral, Daily  Fish OiL, 1 capsule, Oral, Daily  fluticasone propionate, 1 spray, each nostril, Daily (Patient not taking: Reported on 02/02/2023)  montelukast, 10 mg, Oral, Nightly (Patient not taking: Reported on 02/02/2023)  nitrofurantoin (macrocrystal-monohydrate), 100 mg, Oral, Q12H (Patient not taking: Reported on 02/02/2023)  promethazine-dextromethorphan, 5 mL, Oral, Q6H PRN (Patient not taking: Reported on 02/02/2023) Social History: reports that she has quit smoking. She has never used smokeless tobacco. She reports current alcohol use. She reports that she does not use drugs. Concerns for Domestic Violence: NoneGYN HistoryContraceptive History                   Menstrual Cycle  No LMP recorded. Patient is postmenopausal.                                 Sexual History                   OB History Gravida Para Term Preterm AB Living 2 2 1 1   2  SAB IAB Ectopic Molar Multiple Live Births           2  # Outcome Date GA Lbr Len/2nd Weight Sex Delivery Anes PTL Lv 2 Term 1991 [redacted]w[redacted]d   M VBAC   LIV 1 Preterm 1990 [redacted]w[redacted]d   M CS-Unspec   LIV General: Patient denies fevers, chills, sweats, loss of appetite, fatigue, malaise. Eyes: Patient denies blurring, double vision, vision loss, eye pain, light sensitivity. Ears/Nose/Throat: Patient denies earache, ringing in ears, decreased hearing, nasal congestion, difficulty swallowing. Cardiovascular: Patient denies chest pains, palpitations, fainting spells, shortness of breath, ankle swelling.  Respiratory: Patient denies cough, wheezing.  Skin: Patient denies rash, itching, suspicious lesions. GI: Patient denies nausea, vomiting, diarrhea, constipation, change in bowel habits, abdominal pain, bloody or black stools.  GU: Patient denies pain with urination, blood in urine, frequent urination, loss of bladder control.  Musculoskeletal: Patient denies back pain, joint pain, joint swelling, muscle cramps, muscle weakness, stiffness, arthritis. Neurological: Patient denies transient paralysis, weakness, numbness, tingling sensation, seizures, tremors, headaches.  Psychiatric: Patient denies depression, anxiety, memory loss, suicidal thoughts, hallucinations, paranoia. Endocrine: Patient denies cold intolerance, heat intolerance, increased thirst, increased appetite, large quantities of urine. Heme/Lymphatic: Patient denies abnormal bruising, bleeding, enlarged lymph nodes. Allergy/Immunologic: Patient denies persistent infections, HIV exposures. Female/Reproductive: Patient denies  irregular menses, heavy bleeding, dysmenorrhea, spotting between periods, breast tenderness, breast lump, breast enlargement, nipple discharge.  Objective: BP 132/84  - Ht 5' 1 (1.549 m)  - Wt 60 kg  - BMI 24.98 kg/m?  Physical Exam General:  Appears well, no distress	Lungs: Good breath sounds; no rales or rhonchi.Thyroid:normal to inspection and palpationHeart: Rhythm regular.  Normal S1 and S2.  No murmurs, gallops, or rubs.	Breast Exam:Normal appearance, no masses or tendernessAbdomen: well healed scar	External Genitalia: General appearance; normal	Uterus:Normal size, contour, non-tenderVagina: Normal appearance	Cervix::Cervix has normal appearance pap sent			  Adnexa: Normal	Rectal: deferred		Patient consented for cultures: N/AHealth Maintenance: Colonoscopy:  Up to dateDexa Bone Density:  N/AMammogram:  Ordered testBreast [place]: Ordered todayPAP:  Completed todayPCP: Tortorello, JosephAssessment / Plan: Chariyah Wisz is a 62 y.o. G73P1102 female who presents without complaints.Current PlansSure Path Pap - Routine UA without Microscopy (in house) Routine (if approved by patient) Occult Blood Feces Screen- Routine (if approved by patient) Annual Exam - Routine Mammogram - Routine Bone Density, if needed - Routine Instructed to schedule colonoscopy, if needed Dicussed the importance of a well-balanced healthy diet and regular exerciseThe patient was reminded to return in one year for a complete annual GYN exam Patient Education:Specific topics reviewed: see aboveTime 30 minutesMiriam Trampas Stettner, MD

## 2023-02-21 ENCOUNTER — Encounter: Admit: 2023-02-21 | Payer: PRIVATE HEALTH INSURANCE | Attending: Family | Primary: Internal Medicine

## 2023-02-21 MED ORDER — CLOTRIMAZOLE-BETAMETHASONE 1 %-0.05 % TOPICAL CREAM
1-0.05 | Freq: Two times a day (BID) | TOPICAL | 2 refills | 25.00000 days | Status: AC
Start: 2023-02-21 — End: 2023-11-14

## 2023-03-11 DIAGNOSIS — L259 Unspecified contact dermatitis, unspecified cause: Secondary | ICD-10-CM | POA: Diagnosis not present

## 2023-03-11 DIAGNOSIS — L299 Pruritus, unspecified: Secondary | ICD-10-CM | POA: Diagnosis not present

## 2023-03-29 DIAGNOSIS — Z6839 Body mass index (BMI) 39.0-39.9, adult: Secondary | ICD-10-CM | POA: Diagnosis not present

## 2023-03-29 DIAGNOSIS — R7303 Prediabetes: Secondary | ICD-10-CM | POA: Diagnosis not present

## 2023-03-29 DIAGNOSIS — Z8571 Personal history of Hodgkin lymphoma: Secondary | ICD-10-CM | POA: Diagnosis not present

## 2023-03-29 DIAGNOSIS — E6609 Other obesity due to excess calories: Secondary | ICD-10-CM | POA: Diagnosis not present

## 2023-03-29 DIAGNOSIS — R7309 Other abnormal glucose: Secondary | ICD-10-CM | POA: Diagnosis not present

## 2023-03-29 DIAGNOSIS — M654 Radial styloid tenosynovitis [de Quervain]: Secondary | ICD-10-CM | POA: Diagnosis not present

## 2023-03-29 DIAGNOSIS — J45909 Unspecified asthma, uncomplicated: Secondary | ICD-10-CM | POA: Diagnosis not present

## 2023-03-29 DIAGNOSIS — E890 Postprocedural hypothyroidism: Secondary | ICD-10-CM | POA: Diagnosis not present

## 2023-03-29 DIAGNOSIS — I10 Essential (primary) hypertension: Secondary | ICD-10-CM | POA: Diagnosis not present

## 2023-03-29 DIAGNOSIS — E119 Type 2 diabetes mellitus without complications: Secondary | ICD-10-CM | POA: Diagnosis not present

## 2023-03-29 DIAGNOSIS — F419 Anxiety disorder, unspecified: Secondary | ICD-10-CM | POA: Diagnosis not present

## 2023-03-29 DIAGNOSIS — E782 Mixed hyperlipidemia: Secondary | ICD-10-CM | POA: Diagnosis not present

## 2023-04-09 ENCOUNTER — Encounter: Admit: 2023-04-09 | Payer: PRIVATE HEALTH INSURANCE | Attending: Internal Medicine | Primary: Internal Medicine

## 2023-04-09 MED ORDER — ALPRAZOLAM 0.5 MG TABLET
0.5 | ORAL_TABLET | Freq: Every evening | ORAL | 2 refills | 25.00000 days | Status: AC | PRN
Start: 2023-04-09 — End: 2023-07-10

## 2023-04-17 ENCOUNTER — Encounter: Admit: 2023-04-17 | Payer: PRIVATE HEALTH INSURANCE | Attending: Internal Medicine | Primary: Internal Medicine

## 2023-04-18 ENCOUNTER — Inpatient Hospital Stay: Admit: 2023-04-18 | Discharge: 2023-04-18 | Payer: PRIVATE HEALTH INSURANCE | Primary: Internal Medicine

## 2023-04-18 DIAGNOSIS — Z0000 Encounter for general adult medical examination without abnormal findings: Secondary | ICD-10-CM

## 2023-04-18 LAB — COMPREHENSIVE METABOLIC PANEL
BKR A/G RATIO: 1.5 (ref 1.0–2.2)
BKR ALANINE AMINOTRANSFERASE (ALT): 25 U/L (ref 10–35)
BKR ALBUMIN: 4.5 g/dL (ref 3.6–5.1)
BKR ALKALINE PHOSPHATASE: 73 U/L (ref 9–122)
BKR ANION GAP: 9 (ref 7–17)
BKR ASPARTATE AMINOTRANSFERASE (AST): 20 U/L (ref 10–35)
BKR AST/ALT RATIO: 0.8 x 1000/ÂµL (ref 0.00–1.00)
BKR BILIRUBIN TOTAL: 0.8 mg/dL (ref 0.0–<=1.2)
BKR BLOOD UREA NITROGEN: 10 mg/dL (ref 8–23)
BKR BUN / CREAT RATIO: 16.1 (ref 8.0–23.0)
BKR CALCIUM: 9.8 mg/dL (ref 8.8–10.2)
BKR CHLORIDE: 101 mmol/L (ref 98–107)
BKR CO2: 30 mmol/L (ref 20–30)
BKR CREATININE: 0.62 mg/dL (ref 0.40–1.30)
BKR EGFR, CREATININE (CKD-EPI 2021): >60 mL/min/{1.73_m2} (ref >=60)
BKR GLOBULIN: 3 g/dL (ref 2.0–3.9)
BKR GLUCOSE: 89 mg/dL (ref 70–100)
BKR POTASSIUM: 4 mmol/L (ref 3.3–5.3)
BKR PROTEIN TOTAL: 7.5 g/dL (ref 5.9–8.3)
BKR SODIUM: 140 mmol/L (ref 136–144)

## 2023-04-18 LAB — CBC WITH AUTO DIFFERENTIAL
BKR WAM ABSOLUTE IMMATURE GRANULOCYTES.: 0.01 x 1000/ÂµL (ref 0.00–0.30)
BKR WAM ABSOLUTE LYMPHOCYTE COUNT.: 1.26 x 1000/ÂµL (ref 0.60–3.70)
BKR WAM ABSOLUTE NRBC (2 DEC): 0 x 1000/ÂµL (ref 0.00–1.00)
BKR WAM ANALYZER ANC: 3.48 x 1000/ÂµL (ref 2.00–7.60)
BKR WAM BASOPHIL ABSOLUTE COUNT.: 0.06 x 1000/ÂµL (ref 0.00–1.00)
BKR WAM BASOPHILS: 1.1 % (ref 0.0–1.4)
BKR WAM EOSINOPHIL ABSOLUTE COUNT.: 0.13 x 1000/ÂµL (ref 0.00–1.00)
BKR WAM EOSINOPHILS: 2.3 % (ref 0.0–5.0)
BKR WAM HEMATOCRIT (2 DEC): 42.9 % (ref 35.00–45.00)
BKR WAM HEMOGLOBIN: 13.9 g/dL (ref 11.7–15.5)
BKR WAM IMMATURE GRANULOCYTES: 0.2 % (ref 0.0–1.0)
BKR WAM LYMPHOCYTES: 22.7 % (ref 17.0–50.0)
BKR WAM MCH (PG): 29.6 pg (ref 27.0–33.0)
BKR WAM MCHC: 32.4 g/dL (ref 31.0–36.0)
BKR WAM MCV: 91.5 fL (ref 80.0–100.0)
BKR WAM MONOCYTE ABSOLUTE COUNT.: 0.6 x 1000/ÂµL (ref 0.00–1.00)
BKR WAM MONOCYTES: 10.8 % (ref 4.0–12.0)
BKR WAM MPV: 10.9 fL (ref 8.0–12.0)
BKR WAM NEUTROPHILS: 62.9 % — ABNORMAL LOW (ref 39.0–72.0)
BKR WAM NUCLEATED RED BLOOD CELLS: 0 % (ref 0.0–1.0)
BKR WAM PLATELETS: 279 x1000/ÂµL (ref 150–420)
BKR WAM RDW-CV: 13.1 % (ref 11.0–15.0)
BKR WAM RED BLOOD CELL COUNT.: 4.69 M/ÂµL (ref 4.00–6.00)
BKR WAM WHITE BLOOD CELL COUNT: 5.5 x1000/ÂµL (ref 4.0–11.0)

## 2023-04-18 LAB — URINALYSIS WITH CULTURE REFLEX      (BH LMW YH)
BKR BILIRUBIN, UA: NEGATIVE
BKR BLOOD, UA: NEGATIVE % (ref 0.0–1.0)
BKR GLUCOSE, UA: NEGATIVE g/dL (ref 3.6–5.1)
BKR KETONES, UA: NEGATIVE
BKR LEUKOCYTE ESTERASE, UA: NEGATIVE x 1000/ÂµL (ref 2.00–7.60)
BKR NITRITE, UA: NEGATIVE
BKR PH, UA: 6.5 % (ref 5.5–7.5)
BKR PROTEIN, UA: NEGATIVE
BKR SPECIFIC GRAVITY, UA: 1.004 — ABNORMAL LOW (ref 1.005–1.030)
BKR UROBILINOGEN, UA (MG/DL): <2 mg/dL (ref 1.0–<=2.0)

## 2023-04-18 LAB — HEMOGLOBIN A1C
BKR ESTIMATED AVERAGE GLUCOSE: 120 mg/dL
BKR HEMOGLOBIN A1C: 5.8 % — ABNORMAL HIGH (ref 4.0–5.6)

## 2023-04-18 LAB — LIPID PANEL
BKR CHOLESTEROL/HDL RATIO: 3.7 (ref 0.0–5.0)
BKR CHOLESTEROL: 179 mg/dL
BKR HDL CHOLESTEROL: 48 mg/dL (ref >=40)
BKR LDL CHOLESTEROL SAMPSON CALCULATED: 100 mg/dL — ABNORMAL HIGH (ref 4.0–11.0)
BKR TRIGLYCERIDES: 180 mg/dL — ABNORMAL HIGH

## 2023-04-18 LAB — TSH W/REFLEX TO FT4     (BH GH LMW Q YH): BKR THYROID STIMULATING HORMONE: 2.38 ÂµIU/mL — ABNORMAL HIGH

## 2023-04-18 LAB — UA REFLEX CULTURE

## 2023-04-19 ENCOUNTER — Encounter: Admit: 2023-04-19 | Payer: PRIVATE HEALTH INSURANCE | Attending: Internal Medicine | Primary: Internal Medicine

## 2023-04-19 ENCOUNTER — Ambulatory Visit: Admit: 2023-04-19 | Payer: Managed Care (Private) | Attending: Internal Medicine | Primary: Internal Medicine

## 2023-04-26 ENCOUNTER — Encounter: Admit: 2023-04-26 | Payer: PRIVATE HEALTH INSURANCE | Attending: Internal Medicine | Primary: Internal Medicine

## 2023-06-21 ENCOUNTER — Encounter: Admit: 2023-06-21 | Payer: PRIVATE HEALTH INSURANCE | Attending: Family | Primary: Internal Medicine

## 2023-06-21 MED ORDER — ATORVASTATIN 10 MG TABLET
10 | ORAL_TABLET | Freq: Every day | ORAL | 3 refills | 90.00000 days | Status: AC
Start: 2023-06-21 — End: ?

## 2023-07-05 ENCOUNTER — Encounter: Admit: 2023-07-05 | Payer: PRIVATE HEALTH INSURANCE | Primary: Internal Medicine

## 2023-07-05 MED ORDER — CYCLOBENZAPRINE 10 MG TABLET
10 | ORAL_TABLET | Freq: Every evening | ORAL | 1 refills | 10.00000 days | Status: AC | PRN
Start: 2023-07-05 — End: 2023-11-14

## 2023-07-05 MED ORDER — FLUTICASONE PROPIONATE 50 MCG/ACTUATION NASAL SPRAY,SUSPENSION
50 | Freq: Two times a day (BID) | NASAL | 2 refills | 30.00000 days | Status: AC
Start: 2023-07-05 — End: 2023-07-31

## 2023-07-09 ENCOUNTER — Encounter: Admit: 2023-07-09 | Payer: PRIVATE HEALTH INSURANCE | Attending: Internal Medicine | Primary: Internal Medicine

## 2023-07-10 MED ORDER — ALPRAZOLAM 0.5 MG TABLET
0.5 | ORAL_TABLET | Freq: Every evening | ORAL | 2 refills | 25.00000 days | Status: AC | PRN
Start: 2023-07-10 — End: 2024-01-07

## 2023-07-28 ENCOUNTER — Encounter: Admit: 2023-07-28 | Payer: PRIVATE HEALTH INSURANCE | Primary: Internal Medicine

## 2023-07-31 MED ORDER — FLUTICASONE PROPIONATE 50 MCG/ACTUATION NASAL SPRAY,SUSPENSION
50 | Freq: Every day | NASAL | 1 refills | 30.00000 days | Status: AC
Start: 2023-07-31 — End: 2023-10-29

## 2023-08-01 ENCOUNTER — Encounter: Admit: 2023-08-01 | Payer: PRIVATE HEALTH INSURANCE | Primary: Internal Medicine

## 2023-08-01 MED ORDER — AMOXICILLIN 875 MG-POTASSIUM CLAVULANATE 125 MG TABLET
875-125 | Freq: Two times a day (BID) | ORAL | 1.00 refills | 10.00000 days | Status: AC
Start: 2023-08-01 — End: 2023-08-01

## 2023-08-01 MED ORDER — AMOXICILLIN 875 MG-POTASSIUM CLAVULANATE 125 MG TABLET
875-125 | ORAL_TABLET | Freq: Two times a day (BID) | ORAL | 1 refills | 10.00000 days | Status: AC
Start: 2023-08-01 — End: ?

## 2023-08-07 DIAGNOSIS — F419 Anxiety disorder, unspecified: Secondary | ICD-10-CM | POA: Diagnosis not present

## 2023-08-07 DIAGNOSIS — Z23 Encounter for immunization: Secondary | ICD-10-CM | POA: Diagnosis not present

## 2023-08-07 DIAGNOSIS — Z6839 Body mass index (BMI) 39.0-39.9, adult: Secondary | ICD-10-CM | POA: Diagnosis not present

## 2023-08-07 DIAGNOSIS — E6609 Other obesity due to excess calories: Secondary | ICD-10-CM | POA: Diagnosis not present

## 2023-08-07 DIAGNOSIS — Z0001 Encounter for general adult medical examination with abnormal findings: Secondary | ICD-10-CM | POA: Diagnosis not present

## 2023-08-07 DIAGNOSIS — C8170 Other classical Hodgkin lymphoma, unspecified site: Secondary | ICD-10-CM | POA: Diagnosis not present

## 2023-08-07 DIAGNOSIS — E782 Mixed hyperlipidemia: Secondary | ICD-10-CM | POA: Diagnosis not present

## 2023-08-07 DIAGNOSIS — E7849 Other hyperlipidemia: Secondary | ICD-10-CM | POA: Diagnosis not present

## 2023-08-07 DIAGNOSIS — R7303 Prediabetes: Secondary | ICD-10-CM | POA: Diagnosis not present

## 2023-08-07 DIAGNOSIS — E890 Postprocedural hypothyroidism: Secondary | ICD-10-CM | POA: Diagnosis not present

## 2023-08-07 DIAGNOSIS — M654 Radial styloid tenosynovitis [de Quervain]: Secondary | ICD-10-CM | POA: Diagnosis not present

## 2023-08-07 DIAGNOSIS — I10 Essential (primary) hypertension: Secondary | ICD-10-CM | POA: Diagnosis not present

## 2023-08-08 ENCOUNTER — Encounter: Admit: 2023-08-08 | Payer: PRIVATE HEALTH INSURANCE | Primary: Internal Medicine

## 2023-08-08 ENCOUNTER — Ambulatory Visit: Admit: 2023-08-08 | Payer: Managed Care (Private) | Attending: Family | Primary: Internal Medicine

## 2023-08-09 ENCOUNTER — Ambulatory Visit: Admit: 2023-08-09 | Payer: Managed Care (Private) | Primary: Internal Medicine

## 2023-08-09 ENCOUNTER — Encounter: Admit: 2023-08-09 | Payer: PRIVATE HEALTH INSURANCE | Attending: Family | Primary: Internal Medicine

## 2023-08-19 ENCOUNTER — Emergency Department (HOSPITAL_COMMUNITY)
Admission: EM | Admit: 2023-08-19 | Discharge: 2023-08-19 | Disposition: A | Discharge status: Home / Self Care | Payer: BC Managed Care – PPO | Attending: Emergency Medicine | Admitting: Emergency Medicine

## 2023-08-19 ENCOUNTER — Encounter (HOSPITAL_COMMUNITY): Payer: Self-pay

## 2023-08-19 ENCOUNTER — Other Ambulatory Visit: Payer: Self-pay

## 2023-08-19 DIAGNOSIS — I10 Essential (primary) hypertension: Secondary | ICD-10-CM | POA: Diagnosis not present

## 2023-08-19 DIAGNOSIS — M5441 Lumbago with sciatica, right side: Secondary | ICD-10-CM | POA: Insufficient documentation

## 2023-08-19 DIAGNOSIS — Z79899 Other long term (current) drug therapy: Secondary | ICD-10-CM | POA: Diagnosis not present

## 2023-08-19 DIAGNOSIS — E039 Hypothyroidism, unspecified: Secondary | ICD-10-CM | POA: Insufficient documentation

## 2023-08-19 DIAGNOSIS — M545 Low back pain, unspecified: Secondary | ICD-10-CM | POA: Diagnosis not present

## 2023-08-19 MED ORDER — HYDROCODONE-ACETAMINOPHEN 5-325 MG PO TABS: 1.0000 | ORAL_TABLET | Freq: Four times a day (QID) | ORAL | 0 refills | Status: DC | PRN

## 2023-08-19 MED ORDER — LIDOCAINE 5 % EX PTCH: 1.0000 | MEDICATED_PATCH | CUTANEOUS | 0 refills | Status: DC

## 2023-08-19 MED ORDER — KETOROLAC TROMETHAMINE 15 MG/ML IJ SOLN: 15.0000 mg | Freq: Once | INTRAMUSCULAR | Status: DC

## 2023-08-19 MED ORDER — PREDNISONE 50 MG PO TABS: 50.0000 mg | ORAL_TABLET | Freq: Every day | ORAL | 0 refills | Status: AC

## 2023-08-19 MED ORDER — KETOROLAC TROMETHAMINE 15 MG/ML IJ SOLN
15.0000 mg | Freq: Once | INTRAMUSCULAR | Status: AC
  Administered 2023-08-19: 15 mg via INTRAMUSCULAR
  Filled 2023-08-19: qty 1

## 2023-08-19 MED ORDER — HYDROCODONE-ACETAMINOPHEN 5-325 MG PO TABS
1.0000 | ORAL_TABLET | Freq: Once | ORAL | Status: AC
  Administered 2023-08-19: 1 via ORAL
  Filled 2023-08-19: qty 1

## 2023-08-19 NOTE — Discharge Instructions (Signed)
You are seen in the ER today for low back pain radiating into your thigh.  Her description and exam is consistent with a pinched nerve.  Follow-up close with your primary care doctor.  We are giving you some pain medicine in the ER and a prescription for prednisone for home which should help.  Reviewed small amount of stronger pain medicine for at home since you are having such significant pain without relief of over-the-counter medicines at home.  This is only for short-term.  You cannot drive or operate machinery when taking this medication as it can make you very sleepy.

## 2023-08-19 NOTE — ED Provider Notes (Signed)
[place] EMERGENCY DEPARTMENT AT [facility] Provider Note   CSN: [id number] Arrival date & time: 08/19/23  1819     History  Chief Complaint  Patient presents with   Back Pain    [name] is a 62 y.o. female.  She reports history of sciatica on the right side, hypothyroidism, hypertension.  Presents the ER today for right low back pain radiating to the anterior aspect of the right thigh that started 1 week ago and is gradually gotten worse.  Worse with bending over or moving.  No numbness ting or weakness, no saddle anesthesia or paresthesia, no bowel or bladder incontinence denies weight loss, denies fevers or chills does not use IV drugs no history of immune suppression.  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  She had pain like this in the past but it has gotten worse and has not improved with OTC medication as well as heat or ice   Back Pain      Home Medications Prior to Admission medications   Medication Sig Start Date End Date Taking? Authorizing Provider  HYDROcodone-acetaminophen (NORCO) 5-325 MG tablet Take 1 tablet by mouth every 6 (six) hours as needed. 08/19/23  Yes Teresa Nicodemus A, PA-C  lidocaine (LIDODERM) 5 % Place 1 patch onto the skin daily. Remove & Discard patch within 12 hours or as directed by MD 08/19/23  Yes Cristi Loron, Kristiane Morsch A, PA-C  predniSONE (DELTASONE) 50 MG tablet Take 1 tablet (50 mg total) by mouth daily with breakfast for 5 days. 08/19/23 08/24/23 Yes Tahra Hitzeman A, PA-C  acetaminophen (TYLENOL) 650 MG CR tablet Take 650 mg by mouth at bedtime.    [provider]  albuterol (PROVENTIL HFA;VENTOLIN HFA) 108 (90 Base) MCG/ACT inhaler Inhale 2 puffs into the lungs every 4 (four) hours as needed for wheezing or shortness of breath. 07/22/17   Elson Areas, PA-C  amLODipine (NORVASC) 10 MG tablet Take 1 tablet (10 mg total) by mouth every  evening. Patient taking differently: Take 10 mg by mouth at bedtime. 03/30/17   Love, Evlyn Kanner, PA-C  benazepril-hydrochlorthiazide (LOTENSIN HCT) 20-25 MG tablet Take 1 tablet by mouth in the morning. 06/05/17   [provider]  diclofenac (VOLTAREN) 75 MG EC tablet Take 75 mg by mouth 2 (two) times daily.    [provider]  diphenhydrAMINE (BENADRYL) 25 mg capsule Take 25 mg by mouth at bedtime.    [provider]  escitalopram (LEXAPRO) 20 MG tablet Take 20 mg by mouth at bedtime.    [provider]  fexofenadine (ALLEGRA) 180 MG tablet Take 180 mg by mouth in the morning.    [provider]  gabapentin (NEURONTIN) 300 MG capsule Take 300 mg by mouth at bedtime.    [provider]  levothyroxine (SYNTHROID) 75 MCG tablet Take 75 mcg by mouth daily before breakfast.    [provider]  Multiple Vitamin (MULTIVITAMIN WITH MINERALS) TABS tablet Take 1 tablet by mouth in the morning.    [provider]  Omega-3 Fatty Acids (FISH OIL PO) Take 1 capsule by mouth in the morning and at bedtime.    [provider]  Polyethyl Glycol-Propyl Glycol (LUBRICANT EYE DROPS) 0.4-0.3 % SOLN Place 1-2 drops into both eyes 3 (three) times daily as needed (dry/irritated eyes.).    [provider]  polyethylene glycol (MIRALAX / GLYCOLAX) 17 g packet Take  17 g by mouth daily.    [provider]  rosuvastatin (CRESTOR) 20 MG tablet Take 20 mg by mouth every evening.    [provider]      Allergies    Cephalexin, Iodine-131, Codeine, and Contrast media [iodinated contrast media]    Review of Systems   Review of Systems  Musculoskeletal:  Positive for back pain.    Physical Exam Updated Vital Signs BP (!) 132/59   Pulse 92   Temp 98 F (36.7 C)   Resp 20   Wt 96 kg   SpO2 98%   BMI 36.33 kg/m  Physical Exam Vitals and nursing note reviewed.  Constitutional:      General: She is not in acute  distress.    Appearance: She is well-developed.  HENT:     Head: Normocephalic and atraumatic.     Mouth/Throat:     Mouth: Mucous membranes are moist.  Eyes:     Conjunctiva/sclera: Conjunctivae normal.  Cardiovascular:     Rate and Rhythm: Normal rate and regular rhythm.     Heart sounds: No murmur heard. Pulmonary:     Effort: Pulmonary effort is normal. No respiratory distress.     Breath sounds: Normal breath sounds.  Abdominal:     Palpations: Abdomen is soft.     Tenderness: There is no abdominal tenderness.  Musculoskeletal:        General: No swelling.     Cervical back: Neck supple.     Lumbar back: No swelling, edema, deformity, signs of trauma or bony tenderness. Positive right straight leg raise test. Negative left straight leg raise test.       Back:     Comments: No overlying skin changes or dermatomal rash   Skin:    General: Skin is warm and dry.     Capillary Refill: Capillary refill takes less than 2 seconds.  Neurological:     General: No focal deficit present.     Mental Status: She is alert.  Psychiatric:        Mood and Affect: Mood normal.     ED Results / Procedures / Treatments   Labs (all labs ordered are listed, but only abnormal results are displayed) Labs Reviewed - No data to display  EKG None  Radiology No results found.  Procedures Procedures    Medications Ordered in ED Medications  HYDROcodone-acetaminophen (NORCO/VICODIN) 5-325 MG per tablet 1 tablet (1 tablet Oral Given 08/19/23 2122)  ketorolac (TORADOL) 15 MG/ML injection 15 mg (15 mg Intramuscular Given 08/19/23 2121)    ED Course/ Medical Decision Making/ A&P                                 Medical Decision Making This patient presents to the ED for concern of back pain this involves an extensive number of treatment options, and is a complaint that carries with it a high risk of complications and morbidity.  The differential diagnosis includes sprain, strain, HNP,  fracture, DDD, muscle spasm, cauda equina, epidural abscess or hematoma, malignancy, other   Co morbidities that complicate the patient evaluation  Hypertension, hypothyroidism   Additional history obtained:  Additional history obtained from EMR External records from outside source obtained and reviewed including notes   Problem List / ED Course / Critical interventions / Medication management  Patient presents with low back pain in the right lumbar and area in the right  buttock and radiating to the right anterior thigh. Differential considered as above. Symptoms are likely due to a colopathy. They have no high risk features including saddle anesthesia/paresthesia, bowel or bladder incontinence, urinary retention, fever, weight loss, history of cancer or immune suppression, or IV drug use. We discussed plan to treat symptoms with prednisone will give small amount of hydrocodone for more severe pain since OTC medications are minimally helpful, and follow up with ECP. They were advised on strict return precautions.  I have reviewed the patients home medicines and have made adjustments as needed     Risk Prescription drug management.           Final Clinical Impression(s) / ED Diagnoses Final diagnoses:  Acute right-sided low back pain with right-sided sciatica    Rx / DC Orders ED Discharge Orders          Ordered    predniSONE (DELTASONE) 50 MG tablet  Daily with breakfast        08/19/23 2113    lidocaine (LIDODERM) 5 %  Every 24 hours        08/19/23 2113    HYDROcodone-acetaminophen (NORCO) 5-325 MG tablet  Every 6 hours PRN        08/19/23 2116              Josem Kaufmann 08/19/23 2237    Eber Hong, MD 08/20/23 [phone]

## 2023-08-19 NOTE — ED Triage Notes (Signed)
C/o right lower back pain radiating into front of leg(thigh) x1 week.  Bending over makes the pain worse.  Ibuprofen, heat, ice, tylenol w/o relief

## 2023-08-20 ENCOUNTER — Telehealth: Admit: 2023-08-20 | Payer: PRIVATE HEALTH INSURANCE | Attending: Cardiovascular Disease | Primary: Internal Medicine

## 2023-08-21 DIAGNOSIS — Z6838 Body mass index (BMI) 38.0-38.9, adult: Secondary | ICD-10-CM | POA: Diagnosis not present

## 2023-08-21 DIAGNOSIS — E6609 Other obesity due to excess calories: Secondary | ICD-10-CM | POA: Diagnosis not present

## 2023-08-21 DIAGNOSIS — M5441 Lumbago with sciatica, right side: Secondary | ICD-10-CM | POA: Diagnosis not present

## 2023-08-22 ENCOUNTER — Ambulatory Visit: Admit: 2023-08-22 | Payer: Managed Care (Private) | Primary: Internal Medicine

## 2023-08-22 ENCOUNTER — Other Ambulatory Visit: Payer: Self-pay

## 2023-08-22 ENCOUNTER — Emergency Department (HOSPITAL_COMMUNITY): Payer: BC Managed Care – PPO

## 2023-08-22 ENCOUNTER — Emergency Department (HOSPITAL_COMMUNITY)
Admission: EM | Admit: 2023-08-22 | Discharge: 2023-08-22 | Disposition: A | Discharge status: Home / Self Care | Payer: BC Managed Care – PPO | Attending: Emergency Medicine | Admitting: Emergency Medicine

## 2023-08-22 ENCOUNTER — Encounter (HOSPITAL_COMMUNITY): Payer: Self-pay

## 2023-08-22 DIAGNOSIS — M47816 Spondylosis without myelopathy or radiculopathy, lumbar region: Secondary | ICD-10-CM | POA: Diagnosis not present

## 2023-08-22 DIAGNOSIS — Z79899 Other long term (current) drug therapy: Secondary | ICD-10-CM | POA: Diagnosis not present

## 2023-08-22 DIAGNOSIS — M4316 Spondylolisthesis, lumbar region: Secondary | ICD-10-CM | POA: Diagnosis not present

## 2023-08-22 DIAGNOSIS — M5416 Radiculopathy, lumbar region: Secondary | ICD-10-CM | POA: Insufficient documentation

## 2023-08-22 DIAGNOSIS — K5730 Diverticulosis of large intestine without perforation or abscess without bleeding: Secondary | ICD-10-CM | POA: Diagnosis not present

## 2023-08-22 DIAGNOSIS — M545 Low back pain, unspecified: Secondary | ICD-10-CM | POA: Diagnosis not present

## 2023-08-22 DIAGNOSIS — M79604 Pain in right leg: Secondary | ICD-10-CM | POA: Diagnosis not present

## 2023-08-22 HISTORY — DX: Pure hypercholesterolemia, unspecified: E78.00

## 2023-08-22 MED ORDER — KETOROLAC TROMETHAMINE 60 MG/2ML IM SOLN
60.0000 mg | Freq: Once | INTRAMUSCULAR | Status: AC
  Administered 2023-08-22: 60 mg via INTRAMUSCULAR
  Filled 2023-08-22: qty 2

## 2023-08-22 MED ORDER — CELECOXIB 200 MG PO CAPS: 200.0000 mg | ORAL_CAPSULE | Freq: Two times a day (BID) | ORAL | 0 refills | Status: DC

## 2023-08-22 MED ORDER — METHOCARBAMOL 500 MG PO TABS: 500.0000 mg | ORAL_TABLET | Freq: Three times a day (TID) | ORAL | 0 refills | Status: DC | PRN

## 2023-08-22 MED ORDER — KETOROLAC TROMETHAMINE 30 MG/ML IJ SOLN
30.0000 mg | Freq: Once | INTRAMUSCULAR | Status: DC
  Filled 2023-08-22: qty 1

## 2023-08-22 MED ORDER — OXYCODONE HCL 5 MG PO TABS: 2.5000 mg | ORAL_TABLET | Freq: Four times a day (QID) | ORAL | 0 refills | Status: AC | PRN

## 2023-08-22 NOTE — ED Provider Notes (Signed)
[place] EMERGENCY DEPARTMENT AT [facility] Provider Note   CSN: [id number] Arrival date & time: 08/22/23  1058     History  Chief Complaint  Patient presents with   Back Pain    [name] is a 62 y.o. female who presents emergency department with complaint of severe right leg pain.  Patient was seen in the emergency department 3 days ago for the same.  She has a past medical history of bilateral knee osteoarthritis.  Patient reports that she was leaning over to pick up a basket from her laundry room when she stood up had severe shooting pain in her back radiating around the front of her leg.  Patient states that since that time her pain has been unrelenting.  She states that she has been on a steroid taper, diclofenac which she normally takes for pain, Percocet, and Flexeril with no relief in her symptoms.  Her last Percocet use was this morning.  Patient saw her PCP and states "he told me I had 2 choices.  I could either get steroids from him and Flexeril or I could come be admitted to the hospital."  Patient denies bowel or bladder incontinence, fever, chills, recent procedures to her back, foot drop.  She states that her pain is worse when she tries to ambulate, better with rest.  She states she can even stand up long enough to take a shower.   Back Pain      Home Medications Prior to Admission medications   Medication Sig Start Date End Date Taking? Authorizing Provider  acetaminophen (TYLENOL) 650 MG CR tablet Take 650 mg by mouth at bedtime.    [provider]  albuterol (PROVENTIL HFA;VENTOLIN HFA) 108 (90 Base) MCG/ACT inhaler Inhale 2 puffs into the lungs every 4 (four) hours as needed for wheezing or shortness of breath. 07/22/17   Elson Areas, PA-C  amLODipine (NORVASC) 10 MG tablet Take 1 tablet (10 mg total) by mouth every evening. Patient taking differently: Take 10 mg by mouth at bedtime. 03/30/17   Love, Evlyn Kanner, PA-C   benazepril-hydrochlorthiazide (LOTENSIN HCT) 20-25 MG tablet Take 1 tablet by mouth in the morning. 06/05/17   [provider]  diclofenac (VOLTAREN) 75 MG EC tablet Take 75 mg by mouth 2 (two) times daily.    [provider]  diphenhydrAMINE (BENADRYL) 25 mg capsule Take 25 mg by mouth at bedtime.    [provider]  escitalopram (LEXAPRO) 20 MG tablet Take 20 mg by mouth at bedtime.    [provider]  fexofenadine (ALLEGRA) 180 MG tablet Take 180 mg by mouth in the morning.    [provider]  gabapentin (NEURONTIN) 300 MG capsule Take 300 mg by mouth at bedtime.    [provider]  HYDROcodone-acetaminophen (NORCO) 5-325 MG tablet Take 1 tablet by mouth every 6 (six) hours as needed. 08/19/23   Carmel Sacramento A, PA-C  levothyroxine (SYNTHROID) 75 MCG tablet Take 75 mcg by mouth daily before breakfast.    [provider]  lidocaine (LIDODERM) 5 % Place 1 patch onto the skin daily. Remove & Discard patch within 12 hours or as directed by MD 08/19/23   Carmel Sacramento A, PA-C  Multiple Vitamin (MULTIVITAMIN WITH MINERALS) TABS tablet Take 1 tablet by mouth in the morning.    [provider]  Omega-3 Fatty Acids (FISH OIL PO) Take 1 capsule by mouth in the morning and at bedtime.    [provider]  Polyethyl Glycol-Propyl Glycol (LUBRICANT EYE DROPS) 0.4-0.3 % SOLN Place 1-2 drops into both eyes 3 (three) times daily as needed (dry/irritated eyes.).    [provider]  polyethylene glycol (MIRALAX / GLYCOLAX) 17 g packet Take 17 g by mouth daily.    [provider]  predniSONE (DELTASONE) 50 MG tablet Take 1 tablet (50 mg total) by mouth daily with breakfast for 5 days. 08/19/23 08/24/23  Carmel Sacramento A, PA-C  rosuvastatin (CRESTOR) 20 MG tablet Take 20 mg by mouth every evening.    [provider]      Allergies    Cephalexin, Iodine-131, Codeine, and Contrast media [iodinated contrast  media]    Review of Systems   Review of Systems  Musculoskeletal:  Positive for back pain.    Physical Exam Updated Vital Signs BP 135/70 (BP Location: Left Arm)   Pulse 84   Temp 98.2 F (36.8 C) (Oral)   Resp 18   Ht 5\' 4"  (1.626 m)   Wt 104.3 kg   SpO2 95%   BMI 39.48 kg/m  Physical Exam Vitals and nursing note reviewed.  Constitutional:      General: She is not in acute distress.    Appearance: She is well-developed. She is not diaphoretic.  HENT:     Head: Normocephalic and atraumatic.     Right Ear: External ear normal.     Left Ear: External ear normal.     Nose: Nose normal.     Mouth/Throat:     Mouth: Mucous membranes are moist.  Eyes:     General: No scleral icterus.    Conjunctiva/sclera: Conjunctivae normal.  Cardiovascular:     Rate and Rhythm: Normal rate and regular rhythm.     Heart sounds: Normal heart sounds. No murmur heard.    No friction rub. No gallop.  Pulmonary:     Effort: Pulmonary effort is normal. No respiratory distress.     Breath sounds: Normal breath sounds.  Abdominal:     General: Bowel sounds are normal. There is no distension.     Palpations: Abdomen is soft. There is no mass.     Tenderness: There is no abdominal tenderness. There is no guarding.  Musculoskeletal:     Cervical back: Normal range of motion.     Comments: No midline spinal tenderness, tender to palpation bilateral lower back.  Normal reflexes.  Skin:    General: Skin is warm and dry.  Neurological:     Mental Status: She is alert and oriented to person, place, and time.  Psychiatric:        Behavior: Behavior normal.     ED Results / Procedures / Treatments   Labs (all labs ordered are listed, but only abnormal results are displayed) Labs Reviewed - No data to display  EKG None  Radiology No results found.  Procedures Procedures    Medications Ordered in ED Medications - No data to display  ED Course/ Medical Decision Making/ A&P Clinical  Course as of 08/24/23 0840  Wed Aug 22, 2023  2009 CT Lumbar Spine Wo Contrast [AH]    Clinical Course User Index [AH] Arthor Captain, PA-C                                 Medical Decision Making Patient here w/ c/o lumbar back pain   The emergent differential diagnosis for back pain includes but is  not limited to fracture, muscle strain, cauda equina, spinal stenosis. DDD, ankylosing spondylitis, acute ligamentous injury, disk herniation, spondylolisthesis, Epidural compression syndrome, metastatic cancer, transverse myelitis, vertebral osteomyelitis, diskitis, kidney stone, pyelonephritis, AAA, Perforated ulcer, Retrocecal appendicitis, pancreatitis, bowel obstruction, retroperitoneal hemorrhage or mass, meningitis.  Comorbidities:  has a past medical history of Anxiety, Cancer (HCC), H/O radioactive iodine thyroid ablation (1980's), Heart murmur, High cholesterol, History of kidney stones, Hypertension, Hypothyroidism, Pneumonia, Primary localized osteoarthritis of left knee, and S/P total knee replacement using cement, left (03/19/2017).  SDOH Screenings Food Insecurity: No Food Insecurity (11/06/2022) Housing: Low Risk  (11/06/2022) Transportation Needs: No Transportation Needs (11/06/2022) Utilities: Not At Risk (11/06/2022) Social Connections: Unknown (07/04/2023)     Received from [facility] Tobacco Use: Medium Risk (08/22/2023)   Hx gathered from patient, husband and emr  Reveiwed outside records form [facility] including previous gastro notes  Patient pain consistent with Lumbar radiculopathy.   I personally visualized and interpreted the images using our PACS system. Acute findings include:  Grade 1 anterolisthesis and DDD/ arthritic changes  Pain in the L2/3, L3/4 nerve distribution.   Pain improved with Toradol     Patient can walk but states is painful.  No loss of bowel or bladder control.  No concern for cauda equina.  No fever, night sweats, weight loss, h/o  cancer, IVDU.  RICE protocol and pain medicine indicated and discussed with patient.   PDMP reviewed during this encounter.    Amount and/or Complexity of Data Reviewed Radiology: ordered. Decision-making details documented in ED Course.  Risk Prescription drug management.           Final Clinical Impression(s) / ED Diagnoses Final diagnoses:  None    Rx / DC Orders ED Discharge Orders     None         Arthor Captain, PA-C 08/24/23 4010    Tegeler, Canary Brim, MD 08/29/23 [phone]

## 2023-08-22 NOTE — ED Triage Notes (Signed)
Pt c.o continued right lower back pain that radiates down her right leg. Pt taking steroids and pain meds without relief. Pt sent here by her PCP for further workup.

## 2023-08-22 NOTE — Discharge Instructions (Addendum)
Contact a health care provider if: °Your pain and other symptoms get worse. °Your pain medicine is not helping. °Your pain has not improved after a few weeks of home care. °You have a fever. °Get help right away if: °You have severe pain, weakness, or numbness. °You have difficulty with bladder or bowel control. °

## 2023-08-22 NOTE — ED Notes (Signed)
Back pain since last week.  Pt stated that she had bent over doing laundry and hurt herself.  Pt noted that its radiating down her leg now.  No numbness or tingling noted. PT unable to find a position of comfort.  Pt went to [facility] on Sunday night and her PCP wanted her to get "IV steroids and an MRI for surgery."

## 2023-08-28 DIAGNOSIS — S338XXA Sprain of other parts of lumbar spine and pelvis, initial encounter: Secondary | ICD-10-CM | POA: Diagnosis not present

## 2023-08-28 DIAGNOSIS — S73101A Unspecified sprain of right hip, initial encounter: Secondary | ICD-10-CM | POA: Diagnosis not present

## 2023-08-28 DIAGNOSIS — S233XXA Sprain of ligaments of thoracic spine, initial encounter: Secondary | ICD-10-CM | POA: Diagnosis not present

## 2023-08-28 DIAGNOSIS — S134XXA Sprain of ligaments of cervical spine, initial encounter: Secondary | ICD-10-CM | POA: Diagnosis not present

## 2023-08-30 DIAGNOSIS — S233XXA Sprain of ligaments of thoracic spine, initial encounter: Secondary | ICD-10-CM | POA: Diagnosis not present

## 2023-08-30 DIAGNOSIS — S338XXA Sprain of other parts of lumbar spine and pelvis, initial encounter: Secondary | ICD-10-CM | POA: Diagnosis not present

## 2023-08-30 DIAGNOSIS — S73101A Unspecified sprain of right hip, initial encounter: Secondary | ICD-10-CM | POA: Diagnosis not present

## 2023-08-30 DIAGNOSIS — S134XXA Sprain of ligaments of cervical spine, initial encounter: Secondary | ICD-10-CM | POA: Diagnosis not present

## 2023-09-03 ENCOUNTER — Ambulatory Visit: Admit: 2023-09-03 | Payer: Managed Care (Private) | Primary: Internal Medicine

## 2023-09-05 ENCOUNTER — Encounter: Admit: 2023-09-05 | Payer: Managed Care (Private) | Primary: Internal Medicine

## 2023-09-06 DIAGNOSIS — S73101A Unspecified sprain of right hip, initial encounter: Secondary | ICD-10-CM | POA: Diagnosis not present

## 2023-09-06 DIAGNOSIS — S338XXA Sprain of other parts of lumbar spine and pelvis, initial encounter: Secondary | ICD-10-CM | POA: Diagnosis not present

## 2023-09-06 DIAGNOSIS — S233XXA Sprain of ligaments of thoracic spine, initial encounter: Secondary | ICD-10-CM | POA: Diagnosis not present

## 2023-09-06 DIAGNOSIS — S134XXA Sprain of ligaments of cervical spine, initial encounter: Secondary | ICD-10-CM | POA: Diagnosis not present

## 2023-09-10 DIAGNOSIS — Z6838 Body mass index (BMI) 38.0-38.9, adult: Secondary | ICD-10-CM | POA: Diagnosis not present

## 2023-09-10 DIAGNOSIS — M4316 Spondylolisthesis, lumbar region: Secondary | ICD-10-CM | POA: Diagnosis not present

## 2023-09-10 DIAGNOSIS — M4317 Spondylolisthesis, lumbosacral region: Secondary | ICD-10-CM | POA: Diagnosis not present

## 2023-09-13 ENCOUNTER — Other Ambulatory Visit (HOSPITAL_COMMUNITY): Payer: Self-pay | Admitting: Neurosurgery

## 2023-09-13 DIAGNOSIS — M4316 Spondylolisthesis, lumbar region: Secondary | ICD-10-CM

## 2023-09-18 ENCOUNTER — Ambulatory Visit (HOSPITAL_COMMUNITY)
Admission: RE | Admit: 2023-09-18 | Discharge: 2023-09-18 | Disposition: A | Discharge status: Home / Self Care | Payer: BC Managed Care – PPO | Source: Ambulatory Visit | Attending: Neurosurgery | Admitting: Neurosurgery

## 2023-09-18 DIAGNOSIS — M4316 Spondylolisthesis, lumbar region: Secondary | ICD-10-CM | POA: Insufficient documentation

## 2023-09-18 DIAGNOSIS — M47816 Spondylosis without myelopathy or radiculopathy, lumbar region: Secondary | ICD-10-CM | POA: Diagnosis not present

## 2023-09-18 DIAGNOSIS — M48061 Spinal stenosis, lumbar region without neurogenic claudication: Secondary | ICD-10-CM | POA: Diagnosis not present

## 2023-09-18 DIAGNOSIS — M5136 Other intervertebral disc degeneration, lumbar region with discogenic back pain only: Secondary | ICD-10-CM | POA: Diagnosis not present

## 2023-10-11 DIAGNOSIS — M5126 Other intervertebral disc displacement, lumbar region: Secondary | ICD-10-CM | POA: Diagnosis not present

## 2023-10-12 ENCOUNTER — Other Ambulatory Visit: Payer: Self-pay | Admitting: Neurosurgery

## 2023-10-15 ENCOUNTER — Encounter (HOSPITAL_COMMUNITY): Payer: Self-pay | Admitting: Neurosurgery

## 2023-10-15 ENCOUNTER — Other Ambulatory Visit: Payer: Self-pay

## 2023-10-15 NOTE — Progress Notes (Addendum)
SDW CALL  Patient was given pre-op instructions over the phone. The opportunity was given for the patient to ask questions. No further questions asked. Patient verbalized understanding of instructions given.   PCP - Dr. Assunta Found Cardiologist - denies  PPM/ICD - denies Device Orders - n/a Rep Notified - n/a  Chest x-ray - denies EKG - 10/26/23 Stress Test - 2018 ECHO - 2018 Cardiac Cath - denies  Sleep Study - denies CPAP - n/a  No DM  Last dose of GLP1 agonist-  n/a GLP1 instructions: n/a  Blood Thinner Instructions: n/a Aspirin Instructions: patient states that she took her last dose on 12/15  ERAS Protcol - NPO   COVID TEST- n/a   Anesthesia review: yes- heart murmur  Patient denies shortness of breath, fever, cough and chest pain over the phone call   All instructions explained to the patient, with a verbal understanding of the material. Patient agrees to go over the instructions while at home for a better understanding.

## 2023-10-16 ENCOUNTER — Encounter (HOSPITAL_COMMUNITY): Payer: Self-pay | Admitting: Vascular Surgery

## 2023-10-16 NOTE — Anesthesia Preprocedure Evaluation (Signed)
Anesthesia Evaluation    Airway        Dental   Pulmonary former smoker          Cardiovascular hypertension,      Neuro/Psych    GI/Hepatic   Endo/Other    Renal/GU      Musculoskeletal   Abdominal   Peds  Hematology   Anesthesia Other Findings   Reproductive/Obstetrics                             Anesthesia Physical Anesthesia Plan  ASA:   Anesthesia Plan:    Post-op Pain Management:    Induction:   PONV Risk Score and Plan:   Airway Management Planned:   Additional Equipment:   Intra-op Plan:   Post-operative Plan:   Informed Consent:   Plan Discussed with:   Anesthesia Plan Comments: (PAT note written 10/16/2023 by Shonna Chock, PA-C.  )       Anesthesia Quick Evaluation

## 2023-10-16 NOTE — Progress Notes (Signed)
Anesthesia Chart Review: Maury Dus  Case: [id number] Date/Time: 10/17/23 0815   Procedure: Microdiscectomy - L2-L3 - C3   Anesthesia type: General   Pre-op diagnosis: Disc displacement, lumbar   [facility]: [facility] ROOM [facility] / [facility]   Surgeons: Coletta Memos, MD       DISCUSSION: Patient is a 62 year old female scheduled for the above procedure.  History includes former smoker (quit 05/08/90), HTN, hypercholesterolemia, murmur (mild MR 2018), nephrolithiasis, hypothyroidism (following radioactive iodine ablation 1980's), Hodgkins lymphoma (1992 s/p bone marrow transplant), osteoarthritis (left TKA 03/12/17; right TKA 11/06/22), perforated sigmoid diverticulitis (s/p sigmoid colectomy with descending colostomy 03/17/17; colostomy reversal 08/02/17).   Last ASA reported as 10/14/23.  She has a same-day workup, so labs on arrival as indicated.  Anesthesia team to evaluate on the day of surgery.   VS:  Wt Readings from Last 3 Encounters:  08/22/23 104.3 kg  08/19/23 96 kg  11/06/22 96.3 kg   BP Readings from Last 3 Encounters:  08/22/23 121/71  08/19/23 (!) 132/59  11/08/22 (!) 124/59   Pulse Readings from Last 3 Encounters:  08/22/23 81  08/19/23 92  11/08/22 75     PROVIDERS: Assunta Found, MD is PCP  -He is not followed regularly by cardiology but was evaluated by Charlton Haws, MD on 03/12/2017 for preoperative valuation prior to TKA.   LABS: For day of surgery as indicated. As of 11/08/22, H/H 10.8/33.3, PLT 187, Cr 0.75, glucose 151.   IMAGES: MRI L-spine 09/18/23: IMPRESSION: 1. Large caudally-extending disc extrusion in the right subarticular zone at L2-3 contributing to moderate to severe canal stenosis with severe right subarticular recess stenosis. Moderate right and mild-moderate left foraminal stenosis at this level. 2. Mild canal stenosis and moderate left foraminal stenosis at L4-5. 3. Right-sided nephrolithiasis.   EKG: 10/25/22: Normal sinus rhythm Left  axis deviation Minimal voltage criteria for LVH, may be normal variant ( Confirmed by Steffanie Dunn [phone]) on 10/25/2022 9:48:59 PM   CV: Nuclear stress test 02/15/17: Nuclear stress EF: 62%. There was < 1mm of J point depression with horizontal ST segment depression There is a medium sized defect of mild severity present in the basal anterior, mid anterior and apical anterior location. The defect is non-reversible and likely secondary to breast attenuation artifact. No ischemia noted. This is a low risk study. The left ventricular ejection fraction is normal (55-65%).   Echo 02/15/17: Study Conclusions - Left ventricle: The cavity size was normal. There was mild concentric hypertrophy. Systolic function was normal. The estimated ejection fraction was in the range of 60% to 65%. Wall motion was normal; there were no regional wall motion abnormalities. There was no evidence of elevated ventricular filling pressure by Doppler parameters. - Mitral valve: Mildly thickened leaflets. There was mild regurgitation.   Past Medical History:  Diagnosis Date   Anxiety    Cancer (HCC)    hodgkins lymphoma, 1992 Diagnosed   Family history of adverse reaction to anesthesia    daughter has post operative nausea and vomiting   H/O radioactive iodine thyroid ablation 1980's   Heart murmur    High cholesterol    History of kidney stones    Hypertension    Hypothyroidism    "thyroid is dead"   Pneumonia    Primary localized osteoarthritis of left knee    S/P total knee replacement using cement, left 03/19/2017    Past Surgical History:  Procedure Laterality Date   AXILLARY LYMPH NODE DISSECTION Right 1992  BONE MARROW TRANSPLANT     CATARACT EXTRACTION BILATERAL W/ ANTERIOR VITRECTOMY     COLON RESECTION SIGMOID N/A 03/17/2017   Procedure: COLON RESECTION SIGMOID AND CREATION OF COLOSTOMY;  Surgeon: Manus Rudd, MD;  Location: [facility];  Service: General;  Laterality: N/A;   COLONOSCOPY  N/A 09/07/2015   Procedure: COLONOSCOPY;  Surgeon: Franky Macho, MD;  Location: [facility];  Service: Gastroenterology;  Laterality: N/A;   COLOSTOMY REVERSAL N/A 08/02/2017   Procedure: COLOSTOMY REVERSAL;  Surgeon: Manus Rudd, MD;  Location: [facility];  Service: General;  Laterality: N/A;   ECTOPIC PREGNANCY SURGERY  1990   TONSILLECTOMY  1965   TOTAL KNEE ARTHROPLASTY Left 03/12/2017   Procedure: LEFT TOTAL KNEE ARTHROPLASTY;  Surgeon: Salvatore Marvel, MD;  Location: [facility];  Service: Orthopedics;  Laterality: Left;   TOTAL KNEE ARTHROPLASTY Right 11/06/2022   Procedure: TOTAL KNEE ARTHROPLASTY;  Surgeon: Joen Laura, MD;  Location: [facility];  Service: Orthopedics;  Laterality: Right;    MEDICATIONS: No current facility-administered medications for this encounter.    acetaminophen (TYLENOL) 650 MG CR tablet   albuterol (PROVENTIL HFA;VENTOLIN HFA) 108 (90 Base) MCG/ACT inhaler   amLODipine (NORVASC) 10 MG tablet   aspirin EC 81 MG tablet   benazepril-hydrochlorthiazide (LOTENSIN HCT) 20-25 MG tablet   diclofenac (VOLTAREN) 75 MG EC tablet   diphenhydrAMINE (BENADRYL) 25 mg capsule   escitalopram (LEXAPRO) 20 MG tablet   fenofibrate 160 MG tablet   fexofenadine (ALLEGRA) 180 MG tablet   gabapentin (NEURONTIN) 300 MG capsule   levothyroxine (SYNTHROID) 75 MCG tablet   oxyCODONE (ROXICODONE) 5 MG immediate release tablet   polyethylene glycol (MIRALAX / GLYCOLAX) 17 g packet   rosuvastatin (CRESTOR) 20 MG tablet   Multiple Vitamin (MULTIVITAMIN WITH MINERALS) TABS tablet   Omega-3 Fatty Acids (FISH OIL PO)    Shonna Chock, PA-C Surgical Short Stay/Anesthesiology [facility] Phone [phone] [facility] Phone [phone] 10/16/2023 9:29 AM

## 2023-10-17 ENCOUNTER — Ambulatory Visit (HOSPITAL_COMMUNITY): Admission: RE | Admit: 2023-10-17 | Payer: BC Managed Care – PPO | Source: Home / Self Care | Admitting: Neurosurgery

## 2023-10-17 HISTORY — DX: Family history of other specified conditions: Z84.89

## 2023-10-17 SURGERY — DECOMPRESSIVE LUMBAR LAMINECTOMY LEVEL 1
Anesthesia: General

## 2023-10-20 ENCOUNTER — Other Ambulatory Visit: Payer: Self-pay

## 2023-10-20 ENCOUNTER — Encounter (HOSPITAL_COMMUNITY): Payer: Self-pay

## 2023-10-20 ENCOUNTER — Emergency Department (HOSPITAL_COMMUNITY): Payer: BC Managed Care – PPO

## 2023-10-20 ENCOUNTER — Emergency Department (HOSPITAL_COMMUNITY)
Admission: EM | Admit: 2023-10-20 | Discharge: 2023-10-20 | Disposition: A | Discharge status: Home / Self Care | Payer: BC Managed Care – PPO | Attending: Emergency Medicine | Admitting: Emergency Medicine

## 2023-10-20 DIAGNOSIS — I10 Essential (primary) hypertension: Secondary | ICD-10-CM | POA: Diagnosis not present

## 2023-10-20 DIAGNOSIS — E039 Hypothyroidism, unspecified: Secondary | ICD-10-CM | POA: Insufficient documentation

## 2023-10-20 DIAGNOSIS — Z7982 Long term (current) use of aspirin: Secondary | ICD-10-CM | POA: Insufficient documentation

## 2023-10-20 DIAGNOSIS — W19XXXA Unspecified fall, initial encounter: Secondary | ICD-10-CM | POA: Insufficient documentation

## 2023-10-20 DIAGNOSIS — S161XXA Strain of muscle, fascia and tendon at neck level, initial encounter: Secondary | ICD-10-CM | POA: Insufficient documentation

## 2023-10-20 DIAGNOSIS — S199XXA Unspecified injury of neck, initial encounter: Secondary | ICD-10-CM | POA: Diagnosis not present

## 2023-10-20 DIAGNOSIS — Z859 Personal history of malignant neoplasm, unspecified: Secondary | ICD-10-CM | POA: Insufficient documentation

## 2023-10-20 DIAGNOSIS — I959 Hypotension, unspecified: Secondary | ICD-10-CM | POA: Diagnosis not present

## 2023-10-20 DIAGNOSIS — M549 Dorsalgia, unspecified: Secondary | ICD-10-CM | POA: Diagnosis not present

## 2023-10-20 DIAGNOSIS — Z96652 Presence of left artificial knee joint: Secondary | ICD-10-CM | POA: Diagnosis not present

## 2023-10-20 DIAGNOSIS — M5030 Other cervical disc degeneration, unspecified cervical region: Secondary | ICD-10-CM | POA: Diagnosis not present

## 2023-10-20 DIAGNOSIS — M542 Cervicalgia: Secondary | ICD-10-CM | POA: Diagnosis not present

## 2023-10-20 DIAGNOSIS — Z79899 Other long term (current) drug therapy: Secondary | ICD-10-CM | POA: Insufficient documentation

## 2023-10-20 DIAGNOSIS — M545 Low back pain, unspecified: Secondary | ICD-10-CM | POA: Diagnosis not present

## 2023-10-20 DIAGNOSIS — M4802 Spinal stenosis, cervical region: Secondary | ICD-10-CM | POA: Diagnosis not present

## 2023-10-20 NOTE — ED Notes (Signed)
C-collar placed on pt.

## 2023-10-20 NOTE — ED Triage Notes (Signed)
Pt arrived from home via [facility] reporting she fell exiting her vehicle and "heard a crack in my neck." Pt denies LOC, denies hitting her head, Pt denies numbness or tingling in extremities and can control her extremities. Pt reports she recently stopped taking her blood thinner while preparing for a surgery.

## 2023-10-21 NOTE — ED Provider Notes (Signed)
[place] EMERGENCY DEPARTMENT AT [facility] Provider Note   CSN: [id number] Arrival date & time: 10/20/23  1954     History  Chief Complaint  Patient presents with   [name] is a 62 y.o. female.   Fall  Patient presents after fall.  Larey Seat getting out of a vehicle.  No loss conscious.  States she landed forward.  States she felt a crack in the anterior part of her neck.  No numbness or weakness.  No numbness weakness.  Does have chronic back pain.  Due to have low back surgery.  Off her anticoagulation because of that.    Past Medical History:  Diagnosis Date   Anxiety    Cancer (HCC)    hodgkins lymphoma, 1992 Diagnosed   Family history of adverse reaction to anesthesia    daughter has post operative nausea and vomiting   H/O radioactive iodine thyroid ablation 1980's   Heart murmur    High cholesterol    History of kidney stones    Hypertension    Hypothyroidism    "thyroid is dead"   Pneumonia    Primary localized osteoarthritis of left knee    S/P total knee replacement using cement, left 03/19/2017    Home Medications Prior to Admission medications   Medication Sig Start Date End Date Taking? Authorizing Provider  oxyCODONE (ROXICODONE) 5 MG immediate release tablet Take 0.5-1 tablets (2.5-5 mg total) by mouth every 6 (six) hours as needed for severe pain (pain score 7-10). 08/22/23  Yes Arthor Captain, PA-C  acetaminophen (TYLENOL) 650 MG CR tablet Take 1,300 mg by mouth at bedtime.    [provider]  albuterol (PROVENTIL HFA;VENTOLIN HFA) 108 (90 Base) MCG/ACT inhaler Inhale 2 puffs into the lungs every 4 (four) hours as needed for wheezing or shortness of breath. 07/22/17   Elson Areas, PA-C  amLODipine (NORVASC) 10 MG tablet Take 1 tablet (10 mg total) by mouth every evening. Patient taking differently: Take 10 mg by mouth at bedtime. 03/30/17   Love, Evlyn Kanner, PA-C  aspirin EC 81 MG tablet Take 81 mg by mouth daily.  Swallow whole.    [provider]  benazepril-hydrochlorthiazide (LOTENSIN HCT) 20-25 MG tablet Take 1 tablet by mouth in the morning. 06/05/17   [provider]  diclofenac (VOLTAREN) 75 MG EC tablet Take 75 mg by mouth 2 (two) times daily. 09/07/23   [provider]  diphenhydrAMINE (BENADRYL) 25 mg capsule Take 25 mg by mouth at bedtime.    [provider]  escitalopram (LEXAPRO) 20 MG tablet Take 20 mg by mouth at bedtime.    [provider]  fenofibrate 160 MG tablet Take 160 mg by mouth daily.    [provider]  fexofenadine (ALLEGRA) 180 MG tablet Take 180 mg by mouth in the morning.    [provider]  gabapentin (NEURONTIN) 300 MG capsule Take 300 mg by mouth at bedtime.    [provider]  levothyroxine (SYNTHROID) 75 MCG tablet Take 75 mcg by mouth daily before breakfast.    [provider]  Multiple Vitamin (MULTIVITAMIN WITH MINERALS) TABS tablet Take 1 tablet by mouth in the morning.    [provider]  Omega-3 Fatty Acids (FISH OIL PO) Take 1 capsule by mouth in the morning and at bedtime.    [provider]  polyethylene glycol (MIRALAX / GLYCOLAX) 17 g packet Take 17 g by mouth daily as needed  for moderate constipation.    [provider]  rosuvastatin (CRESTOR) 20 MG tablet Take 20 mg by mouth every evening.    [provider]      Allergies    Cephalexin, Iodine-131, Codeine, and Contrast media [iodinated contrast media]    Review of Systems   Review of Systems  Physical Exam Updated Vital Signs BP (!) 139/59 (BP Location: Right Arm)   Pulse 90   Temp 99.3 F (37.4 C) (Oral)   Resp 18   Ht 5\' 4"  (1.626 m)   Wt 105 kg   SpO2 98%   BMI 39.73 kg/m  Physical Exam Vitals reviewed.  HENT:     Head: Normocephalic.  Eyes:     Pupils: Pupils are equal, round, and reactive to light.  Neck:     Comments:  tenderness to upper posterior cervical spine.  No  deformity.  No anterior tenderness. Pulmonary:     Breath sounds: No wheezing or rhonchi.  Abdominal:     Tenderness: There is no abdominal tenderness.  Musculoskeletal:        General: No tenderness.  Neurological:     Mental Status: She is alert.     ED Results / Procedures / Treatments   Labs (all labs ordered are listed, but only abnormal results are displayed) Labs Reviewed - No data to display  EKG None  Radiology CT Cervical Spine Wo Contrast Result Date: 10/20/2023 CLINICAL DATA:  Neck trauma, midline tenderness (Age 48-64y).  Fall. EXAM: CT CERVICAL SPINE WITHOUT CONTRAST TECHNIQUE: Multidetector CT imaging of the cervical spine was performed without intravenous contrast. Multiplanar CT image reconstructions were also generated. RADIATION DOSE REDUCTION: This exam was performed according to the departmental dose-optimization program which includes automated exposure control, adjustment of the mA and/or kV according to patient size and/or use of iterative reconstruction technique. COMPARISON:  None Available. FINDINGS: Alignment: No subluxation. Skull base and vertebrae: No acute fracture. No primary bone lesion or focal pathologic process. Soft tissues and spinal canal: No prevertebral fluid or swelling. No visible canal hematoma. Disc levels: Disc space narrowing and spurring in the lower cervical spine. Moderate bilateral degenerative facet disease diffusely, right greater than left. Multilevel bilateral neural foraminal narrowing in the mid to lower cervical spine due to facet disease and uncovertebral spurring. Upper chest: No acute findings. Other: None IMPRESSION: Degenerative disc and facet disease.  No acute bony abnormality. Electronically Signed   By: Charlett Nose M.D.   On: 10/20/2023 21:18    Procedures Procedures    Medications Ordered in ED Medications - No data to display  ED Course/ Medical Decision Making/ A&P                                 Medical  Decision Making Amount and/or Complexity of Data Reviewed Radiology: ordered.   Patient with fall.  Neck pain.  Did not hit head.  Not currently on anticoagulation due to planned surgery.  Differential diagnosis does include both anterior neck and cervical spine injury.  However there is no anterior tenderness.  Cervical spine CT done and reassuring.  No other.  Injury.  Will discharge home.  Can follow-up with her neurosurgeon.        Final Clinical Impression(s) / ED Diagnoses Final diagnoses:  Fall, initial encounter  Strain of neck muscle, initial encounter    Rx / DC Orders ED Discharge Orders  None         Benjiman Core, MD 10/21/23 1445

## 2023-10-27 ENCOUNTER — Encounter: Admit: 2023-10-27 | Payer: PRIVATE HEALTH INSURANCE | Primary: Internal Medicine

## 2023-10-29 MED ORDER — FLUTICASONE PROPIONATE 50 MCG/ACTUATION NASAL SPRAY,SUSPENSION
50 | Freq: Every day | NASAL | 1 refills | 30.00000 days | Status: AC
Start: 2023-10-29 — End: ?

## 2023-11-14 ENCOUNTER — Encounter: Admit: 2023-11-14 | Payer: Managed Care (Private) | Attending: Internal Medicine | Primary: Internal Medicine

## 2023-11-14 VITALS — BP 120/72 | HR 70 | Ht 62.0 in | Wt 135.0 lb

## 2023-11-14 DIAGNOSIS — G2581 Restless legs syndrome: Secondary | ICD-10-CM

## 2023-11-14 MED ORDER — MAG GLYCINATE 100 MG TABLET
100 | ORAL | Status: AC
Start: 2023-11-14 — End: ?

## 2024-01-02 ENCOUNTER — Inpatient Hospital Stay: Admit: 2024-01-02 | Discharge: 2024-01-02 | Payer: PRIVATE HEALTH INSURANCE | Primary: Internal Medicine

## 2024-01-02 ENCOUNTER — Ambulatory Visit: Admit: 2024-01-02 | Payer: Managed Care (Private) | Attending: Internal Medicine | Primary: Internal Medicine

## 2024-01-02 ENCOUNTER — Encounter: Admit: 2024-01-02 | Payer: PRIVATE HEALTH INSURANCE | Attending: Internal Medicine | Primary: Internal Medicine

## 2024-01-02 VITALS — BP 148/86 | HR 80 | Temp 98.00000°F | Wt 128.0 lb

## 2024-01-02 DIAGNOSIS — T7840XA Allergy, unspecified, initial encounter: Secondary | ICD-10-CM

## 2024-01-02 DIAGNOSIS — E785 Hyperlipidemia, unspecified: Secondary | ICD-10-CM

## 2024-01-02 DIAGNOSIS — R109 Unspecified abdominal pain: Secondary | ICD-10-CM

## 2024-01-02 DIAGNOSIS — K5790 Diverticulosis of intestine, part unspecified, without perforation or abscess without bleeding: Secondary | ICD-10-CM

## 2024-01-02 DIAGNOSIS — R9389 Abnormal findings on diagnostic imaging of other specified body structures: Secondary | ICD-10-CM

## 2024-01-02 DIAGNOSIS — D369 Benign neoplasm, unspecified site: Secondary | ICD-10-CM

## 2024-01-02 DIAGNOSIS — R634 Abnormal weight loss: Secondary | ICD-10-CM

## 2024-01-02 DIAGNOSIS — R030 Elevated blood-pressure reading, without diagnosis of hypertension: Secondary | ICD-10-CM

## 2024-01-02 DIAGNOSIS — G2581 Restless legs syndrome: Secondary | ICD-10-CM

## 2024-01-02 DIAGNOSIS — K648 Other hemorrhoids: Secondary | ICD-10-CM

## 2024-01-02 LAB — CBC WITH AUTO DIFFERENTIAL
BKR WAM ABSOLUTE IMMATURE GRANULOCYTES.: 0.01 x 1000/ÂµL (ref 0.00–0.30)
BKR WAM ABSOLUTE LYMPHOCYTE COUNT.: 1.24 x 1000/ÂµL (ref 0.60–3.70)
BKR WAM ABSOLUTE NRBC (2 DEC): 0 x 1000/ÂµL (ref 0.00–1.00)
BKR WAM ANC (ABSOLUTE NEUTROPHIL COUNT): 5.54 x 1000/ÂµL (ref 2.00–7.60)
BKR WAM BASOPHIL ABSOLUTE COUNT.: 0.03 x 1000/ÂµL (ref 0.00–1.00)
BKR WAM BASOPHILS: 0.4 % (ref 0.0–1.4)
BKR WAM EOSINOPHIL ABSOLUTE COUNT.: 0.04 x 1000/ÂµL (ref 0.00–1.00)
BKR WAM EOSINOPHILS: 0.5 % (ref 0.0–5.0)
BKR WAM HEMATOCRIT (2 DEC): 46.9 % — ABNORMAL HIGH (ref 35.00–45.00)
BKR WAM HEMOGLOBIN: 15.2 g/dL (ref 11.7–15.5)
BKR WAM IMMATURE GRANULOCYTES: 0.1 % (ref 0.0–1.0)
BKR WAM LYMPHOCYTES: 16.8 % — ABNORMAL LOW (ref 17.0–50.0)
BKR WAM MCH (PG): 29.5 pg (ref 27.0–33.0)
BKR WAM MCHC: 32.4 g/dL (ref 31.0–36.0)
BKR WAM MCV: 91.1 fL (ref 80.0–100.0)
BKR WAM MONOCYTE ABSOLUTE COUNT.: 0.53 x 1000/ÂµL (ref 0.00–1.00)
BKR WAM MONOCYTES: 7.2 % (ref 4.0–12.0)
BKR WAM MPV: 10.8 fL (ref 8.0–12.0)
BKR WAM NEUTROPHILS: 75 % — ABNORMAL HIGH (ref 39.0–72.0)
BKR WAM NUCLEATED RED BLOOD CELLS: 0 % (ref 0.0–1.0)
BKR WAM PLATELETS: 319 x1000/ÂµL (ref 150–420)
BKR WAM RDW-CV: 12.5 % (ref 11.0–15.0)
BKR WAM RED BLOOD CELL COUNT.: 5.15 M/ÂµL (ref 4.00–6.00)
BKR WAM WHITE BLOOD CELL COUNT: 7.4 x1000/ÂµL (ref 4.0–11.0)

## 2024-01-02 LAB — COMPREHENSIVE METABOLIC PANEL
BKR A/G RATIO: 1.4 (ref 1.0–2.2)
BKR ALANINE AMINOTRANSFERASE (ALT): 22 U/L (ref 10–35)
BKR ALBUMIN: 4.7 g/dL (ref 3.6–5.1)
BKR ALKALINE PHOSPHATASE: 73 U/L (ref 9–122)
BKR ANION GAP: 10 (ref 7–17)
BKR ASPARTATE AMINOTRANSFERASE (AST): 22 U/L (ref 10–35)
BKR AST/ALT RATIO: 1
BKR BILIRUBIN TOTAL: 0.7 mg/dL (ref <=1.2)
BKR BLOOD UREA NITROGEN: 13 mg/dL (ref 8–23)
BKR BUN / CREAT RATIO: 21 (ref 8.0–23.0)
BKR CALCIUM: 10.3 mg/dL — ABNORMAL HIGH (ref 8.8–10.2)
BKR CHLORIDE: 101 mmol/L (ref 98–107)
BKR CO2: 30 mmol/L (ref 20–30)
BKR CREATININE DELTA: 0
BKR CREATININE: 0.62 mg/dL (ref 0.40–1.30)
BKR EGFR, CREATININE (CKD-EPI 2021): >60 mL/min/{1.73_m2} (ref >=60)
BKR GLOBULIN: 3.3 g/dL (ref 2.0–3.9)
BKR GLUCOSE: 116 mg/dL — ABNORMAL HIGH (ref 70–100)
BKR POTASSIUM: 4.6 mmol/L (ref 3.3–5.3)
BKR PROTEIN TOTAL: 8 g/dL (ref 5.9–8.3)
BKR SODIUM: 141 mmol/L (ref 136–144)

## 2024-01-02 LAB — URINALYSIS WITH CULTURE REFLEX      (BH LMW YH)
BKR BILIRUBIN, UA: NEGATIVE
BKR BLOOD, UA: NEGATIVE
BKR GLUCOSE, UA: NEGATIVE
BKR KETONES, UA: NEGATIVE
BKR LEUKOCYTE ESTERASE, UA: NEGATIVE
BKR NITRITE, UA: NEGATIVE
BKR PH, UA: 7 (ref 5.5–7.5)
BKR PROTEIN, UA: NEGATIVE
BKR SPECIFIC GRAVITY, UA: 1.01 (ref 1.005–1.030)
BKR UROBILINOGEN, UA (MG/DL): <2 mg/dL (ref <=2.0)

## 2024-01-02 LAB — UA REFLEX CULTURE

## 2024-01-02 LAB — BILIRUBIN, DIRECT: BKR BILIRUBIN DIRECT: 0.2 mg/dL (ref <=0.2)

## 2024-01-02 LAB — LIPASE: BKR LIPASE: 20 U/L (ref 11–55)

## 2024-01-02 LAB — TSH W/REFLEX TO FT4     (BH GH LMW Q YH): BKR THYROID STIMULATING HORMONE: 2.74 u[IU]/mL

## 2024-01-02 MED ORDER — PANTOPRAZOLE 40 MG TABLET,DELAYED RELEASE
40 | ORAL_TABLET | Freq: Every day | ORAL | 1 refills | 90.00000 days | Status: AC
Start: 2024-01-02 — End: 2024-01-07

## 2024-01-03 DIAGNOSIS — M5412 Radiculopathy, cervical region: Secondary | ICD-10-CM | POA: Diagnosis not present

## 2024-01-03 DIAGNOSIS — M9905 Segmental and somatic dysfunction of pelvic region: Secondary | ICD-10-CM | POA: Diagnosis not present

## 2024-01-03 DIAGNOSIS — M9902 Segmental and somatic dysfunction of thoracic region: Secondary | ICD-10-CM | POA: Diagnosis not present

## 2024-01-03 DIAGNOSIS — M9903 Segmental and somatic dysfunction of lumbar region: Secondary | ICD-10-CM | POA: Diagnosis not present

## 2024-01-03 DIAGNOSIS — M9901 Segmental and somatic dysfunction of cervical region: Secondary | ICD-10-CM | POA: Diagnosis not present

## 2024-01-07 ENCOUNTER — Encounter: Admit: 2024-01-07 | Payer: PRIVATE HEALTH INSURANCE | Attending: Internal Medicine | Primary: Internal Medicine

## 2024-01-07 MED ORDER — ALPRAZOLAM 0.5 MG TABLET
0.5 | ORAL_TABLET | Freq: Every evening | ORAL | 2 refills | 30.00000 days | Status: AC | PRN
Start: 2024-01-07 — End: 2024-06-06

## 2024-01-15 DIAGNOSIS — M545 Low back pain, unspecified: Secondary | ICD-10-CM | POA: Diagnosis not present

## 2024-01-15 DIAGNOSIS — M9902 Segmental and somatic dysfunction of thoracic region: Secondary | ICD-10-CM | POA: Diagnosis not present

## 2024-01-15 DIAGNOSIS — M9901 Segmental and somatic dysfunction of cervical region: Secondary | ICD-10-CM | POA: Diagnosis not present

## 2024-01-15 DIAGNOSIS — R293 Abnormal posture: Secondary | ICD-10-CM | POA: Diagnosis not present

## 2024-01-15 DIAGNOSIS — M9905 Segmental and somatic dysfunction of pelvic region: Secondary | ICD-10-CM | POA: Diagnosis not present

## 2024-01-15 DIAGNOSIS — M791 Myalgia, unspecified site: Secondary | ICD-10-CM | POA: Diagnosis not present

## 2024-01-15 DIAGNOSIS — E669 Obesity, unspecified: Secondary | ICD-10-CM | POA: Diagnosis not present

## 2024-01-15 DIAGNOSIS — M9903 Segmental and somatic dysfunction of lumbar region: Secondary | ICD-10-CM | POA: Diagnosis not present

## 2024-01-17 DIAGNOSIS — M545 Low back pain, unspecified: Secondary | ICD-10-CM | POA: Diagnosis not present

## 2024-01-17 DIAGNOSIS — E669 Obesity, unspecified: Secondary | ICD-10-CM | POA: Diagnosis not present

## 2024-01-17 DIAGNOSIS — M9905 Segmental and somatic dysfunction of pelvic region: Secondary | ICD-10-CM | POA: Diagnosis not present

## 2024-01-17 DIAGNOSIS — M791 Myalgia, unspecified site: Secondary | ICD-10-CM | POA: Diagnosis not present

## 2024-01-17 DIAGNOSIS — M9902 Segmental and somatic dysfunction of thoracic region: Secondary | ICD-10-CM | POA: Diagnosis not present

## 2024-01-17 DIAGNOSIS — R293 Abnormal posture: Secondary | ICD-10-CM | POA: Diagnosis not present

## 2024-01-17 DIAGNOSIS — M9901 Segmental and somatic dysfunction of cervical region: Secondary | ICD-10-CM | POA: Diagnosis not present

## 2024-01-17 DIAGNOSIS — M9903 Segmental and somatic dysfunction of lumbar region: Secondary | ICD-10-CM | POA: Diagnosis not present

## 2024-01-22 DIAGNOSIS — M9905 Segmental and somatic dysfunction of pelvic region: Secondary | ICD-10-CM | POA: Diagnosis not present

## 2024-01-22 DIAGNOSIS — M9901 Segmental and somatic dysfunction of cervical region: Secondary | ICD-10-CM | POA: Diagnosis not present

## 2024-01-22 DIAGNOSIS — E669 Obesity, unspecified: Secondary | ICD-10-CM | POA: Diagnosis not present

## 2024-01-22 DIAGNOSIS — M9903 Segmental and somatic dysfunction of lumbar region: Secondary | ICD-10-CM | POA: Diagnosis not present

## 2024-01-22 DIAGNOSIS — M791 Myalgia, unspecified site: Secondary | ICD-10-CM | POA: Diagnosis not present

## 2024-01-22 DIAGNOSIS — M9902 Segmental and somatic dysfunction of thoracic region: Secondary | ICD-10-CM | POA: Diagnosis not present

## 2024-01-22 DIAGNOSIS — M545 Low back pain, unspecified: Secondary | ICD-10-CM | POA: Diagnosis not present

## 2024-01-22 DIAGNOSIS — R293 Abnormal posture: Secondary | ICD-10-CM | POA: Diagnosis not present

## 2024-01-24 DIAGNOSIS — M9901 Segmental and somatic dysfunction of cervical region: Secondary | ICD-10-CM | POA: Diagnosis not present

## 2024-01-24 DIAGNOSIS — M9903 Segmental and somatic dysfunction of lumbar region: Secondary | ICD-10-CM | POA: Diagnosis not present

## 2024-01-24 DIAGNOSIS — R293 Abnormal posture: Secondary | ICD-10-CM | POA: Diagnosis not present

## 2024-01-24 DIAGNOSIS — M9902 Segmental and somatic dysfunction of thoracic region: Secondary | ICD-10-CM | POA: Diagnosis not present

## 2024-01-24 DIAGNOSIS — E669 Obesity, unspecified: Secondary | ICD-10-CM | POA: Diagnosis not present

## 2024-01-24 DIAGNOSIS — M545 Low back pain, unspecified: Secondary | ICD-10-CM | POA: Diagnosis not present

## 2024-01-24 DIAGNOSIS — M9905 Segmental and somatic dysfunction of pelvic region: Secondary | ICD-10-CM | POA: Diagnosis not present

## 2024-01-24 DIAGNOSIS — M791 Myalgia, unspecified site: Secondary | ICD-10-CM | POA: Diagnosis not present

## 2024-01-29 DIAGNOSIS — M545 Low back pain, unspecified: Secondary | ICD-10-CM | POA: Diagnosis not present

## 2024-01-29 DIAGNOSIS — M9903 Segmental and somatic dysfunction of lumbar region: Secondary | ICD-10-CM | POA: Diagnosis not present

## 2024-01-29 DIAGNOSIS — E669 Obesity, unspecified: Secondary | ICD-10-CM | POA: Diagnosis not present

## 2024-01-29 DIAGNOSIS — R293 Abnormal posture: Secondary | ICD-10-CM | POA: Diagnosis not present

## 2024-01-29 DIAGNOSIS — M791 Myalgia, unspecified site: Secondary | ICD-10-CM | POA: Diagnosis not present

## 2024-01-29 DIAGNOSIS — M542 Cervicalgia: Secondary | ICD-10-CM | POA: Diagnosis not present

## 2024-01-29 DIAGNOSIS — M9905 Segmental and somatic dysfunction of pelvic region: Secondary | ICD-10-CM | POA: Diagnosis not present

## 2024-01-29 DIAGNOSIS — M9901 Segmental and somatic dysfunction of cervical region: Secondary | ICD-10-CM | POA: Diagnosis not present

## 2024-01-29 DIAGNOSIS — M9902 Segmental and somatic dysfunction of thoracic region: Secondary | ICD-10-CM | POA: Diagnosis not present

## 2024-01-31 DIAGNOSIS — M9901 Segmental and somatic dysfunction of cervical region: Secondary | ICD-10-CM | POA: Diagnosis not present

## 2024-01-31 DIAGNOSIS — M9902 Segmental and somatic dysfunction of thoracic region: Secondary | ICD-10-CM | POA: Diagnosis not present

## 2024-01-31 DIAGNOSIS — E669 Obesity, unspecified: Secondary | ICD-10-CM | POA: Diagnosis not present

## 2024-01-31 DIAGNOSIS — R293 Abnormal posture: Secondary | ICD-10-CM | POA: Diagnosis not present

## 2024-01-31 DIAGNOSIS — M545 Low back pain, unspecified: Secondary | ICD-10-CM | POA: Diagnosis not present

## 2024-01-31 DIAGNOSIS — M9905 Segmental and somatic dysfunction of pelvic region: Secondary | ICD-10-CM | POA: Diagnosis not present

## 2024-01-31 DIAGNOSIS — M791 Myalgia, unspecified site: Secondary | ICD-10-CM | POA: Diagnosis not present

## 2024-01-31 DIAGNOSIS — M9903 Segmental and somatic dysfunction of lumbar region: Secondary | ICD-10-CM | POA: Diagnosis not present

## 2024-02-05 DIAGNOSIS — M545 Low back pain, unspecified: Secondary | ICD-10-CM | POA: Diagnosis not present

## 2024-02-05 DIAGNOSIS — M791 Myalgia, unspecified site: Secondary | ICD-10-CM | POA: Diagnosis not present

## 2024-02-05 DIAGNOSIS — M9901 Segmental and somatic dysfunction of cervical region: Secondary | ICD-10-CM | POA: Diagnosis not present

## 2024-02-05 DIAGNOSIS — R293 Abnormal posture: Secondary | ICD-10-CM | POA: Diagnosis not present

## 2024-02-05 DIAGNOSIS — M9905 Segmental and somatic dysfunction of pelvic region: Secondary | ICD-10-CM | POA: Diagnosis not present

## 2024-02-05 DIAGNOSIS — E669 Obesity, unspecified: Secondary | ICD-10-CM | POA: Diagnosis not present

## 2024-02-05 DIAGNOSIS — M9903 Segmental and somatic dysfunction of lumbar region: Secondary | ICD-10-CM | POA: Diagnosis not present

## 2024-02-05 DIAGNOSIS — M9902 Segmental and somatic dysfunction of thoracic region: Secondary | ICD-10-CM | POA: Diagnosis not present

## 2024-02-07 DIAGNOSIS — M9902 Segmental and somatic dysfunction of thoracic region: Secondary | ICD-10-CM | POA: Diagnosis not present

## 2024-02-07 DIAGNOSIS — M9905 Segmental and somatic dysfunction of pelvic region: Secondary | ICD-10-CM | POA: Diagnosis not present

## 2024-02-07 DIAGNOSIS — M545 Low back pain, unspecified: Secondary | ICD-10-CM | POA: Diagnosis not present

## 2024-02-07 DIAGNOSIS — R293 Abnormal posture: Secondary | ICD-10-CM | POA: Diagnosis not present

## 2024-02-07 DIAGNOSIS — M9901 Segmental and somatic dysfunction of cervical region: Secondary | ICD-10-CM | POA: Diagnosis not present

## 2024-02-07 DIAGNOSIS — M9903 Segmental and somatic dysfunction of lumbar region: Secondary | ICD-10-CM | POA: Diagnosis not present

## 2024-02-07 DIAGNOSIS — M791 Myalgia, unspecified site: Secondary | ICD-10-CM | POA: Diagnosis not present

## 2024-02-07 DIAGNOSIS — E669 Obesity, unspecified: Secondary | ICD-10-CM | POA: Diagnosis not present

## 2024-02-12 DIAGNOSIS — M9902 Segmental and somatic dysfunction of thoracic region: Secondary | ICD-10-CM | POA: Diagnosis not present

## 2024-02-12 DIAGNOSIS — R293 Abnormal posture: Secondary | ICD-10-CM | POA: Diagnosis not present

## 2024-02-12 DIAGNOSIS — E669 Obesity, unspecified: Secondary | ICD-10-CM | POA: Diagnosis not present

## 2024-02-12 DIAGNOSIS — M791 Myalgia, unspecified site: Secondary | ICD-10-CM | POA: Diagnosis not present

## 2024-02-12 DIAGNOSIS — M9903 Segmental and somatic dysfunction of lumbar region: Secondary | ICD-10-CM | POA: Diagnosis not present

## 2024-02-12 DIAGNOSIS — M9905 Segmental and somatic dysfunction of pelvic region: Secondary | ICD-10-CM | POA: Diagnosis not present

## 2024-02-12 DIAGNOSIS — M9901 Segmental and somatic dysfunction of cervical region: Secondary | ICD-10-CM | POA: Diagnosis not present

## 2024-02-12 DIAGNOSIS — M545 Low back pain, unspecified: Secondary | ICD-10-CM | POA: Diagnosis not present

## 2024-02-14 DIAGNOSIS — E669 Obesity, unspecified: Secondary | ICD-10-CM | POA: Diagnosis not present

## 2024-02-14 DIAGNOSIS — M545 Low back pain, unspecified: Secondary | ICD-10-CM | POA: Diagnosis not present

## 2024-02-14 DIAGNOSIS — M9901 Segmental and somatic dysfunction of cervical region: Secondary | ICD-10-CM | POA: Diagnosis not present

## 2024-02-14 DIAGNOSIS — M9905 Segmental and somatic dysfunction of pelvic region: Secondary | ICD-10-CM | POA: Diagnosis not present

## 2024-02-14 DIAGNOSIS — M9902 Segmental and somatic dysfunction of thoracic region: Secondary | ICD-10-CM | POA: Diagnosis not present

## 2024-02-14 DIAGNOSIS — R293 Abnormal posture: Secondary | ICD-10-CM | POA: Diagnosis not present

## 2024-02-14 DIAGNOSIS — M9903 Segmental and somatic dysfunction of lumbar region: Secondary | ICD-10-CM | POA: Diagnosis not present

## 2024-02-14 DIAGNOSIS — M791 Myalgia, unspecified site: Secondary | ICD-10-CM | POA: Diagnosis not present

## 2024-02-15 ENCOUNTER — Encounter: Admit: 2024-02-15 | Payer: PRIVATE HEALTH INSURANCE | Attending: Obstetrics and Gynecology | Primary: Internal Medicine

## 2024-02-19 DIAGNOSIS — M9901 Segmental and somatic dysfunction of cervical region: Secondary | ICD-10-CM | POA: Diagnosis not present

## 2024-02-19 DIAGNOSIS — R293 Abnormal posture: Secondary | ICD-10-CM | POA: Diagnosis not present

## 2024-02-19 DIAGNOSIS — M791 Myalgia, unspecified site: Secondary | ICD-10-CM | POA: Diagnosis not present

## 2024-02-19 DIAGNOSIS — M9903 Segmental and somatic dysfunction of lumbar region: Secondary | ICD-10-CM | POA: Diagnosis not present

## 2024-02-19 DIAGNOSIS — M9902 Segmental and somatic dysfunction of thoracic region: Secondary | ICD-10-CM | POA: Diagnosis not present

## 2024-02-19 DIAGNOSIS — E669 Obesity, unspecified: Secondary | ICD-10-CM | POA: Diagnosis not present

## 2024-02-19 DIAGNOSIS — M545 Low back pain, unspecified: Secondary | ICD-10-CM | POA: Diagnosis not present

## 2024-02-19 DIAGNOSIS — M9905 Segmental and somatic dysfunction of pelvic region: Secondary | ICD-10-CM | POA: Diagnosis not present

## 2024-02-21 DIAGNOSIS — M9901 Segmental and somatic dysfunction of cervical region: Secondary | ICD-10-CM | POA: Diagnosis not present

## 2024-02-21 DIAGNOSIS — M9902 Segmental and somatic dysfunction of thoracic region: Secondary | ICD-10-CM | POA: Diagnosis not present

## 2024-02-21 DIAGNOSIS — M9903 Segmental and somatic dysfunction of lumbar region: Secondary | ICD-10-CM | POA: Diagnosis not present

## 2024-02-21 DIAGNOSIS — M9905 Segmental and somatic dysfunction of pelvic region: Secondary | ICD-10-CM | POA: Diagnosis not present

## 2024-02-26 DIAGNOSIS — M9905 Segmental and somatic dysfunction of pelvic region: Secondary | ICD-10-CM | POA: Diagnosis not present

## 2024-02-26 DIAGNOSIS — M9903 Segmental and somatic dysfunction of lumbar region: Secondary | ICD-10-CM | POA: Diagnosis not present

## 2024-02-26 DIAGNOSIS — M9902 Segmental and somatic dysfunction of thoracic region: Secondary | ICD-10-CM | POA: Diagnosis not present

## 2024-02-26 DIAGNOSIS — M9901 Segmental and somatic dysfunction of cervical region: Secondary | ICD-10-CM | POA: Diagnosis not present

## 2024-02-28 DIAGNOSIS — M9903 Segmental and somatic dysfunction of lumbar region: Secondary | ICD-10-CM | POA: Diagnosis not present

## 2024-02-28 DIAGNOSIS — M9905 Segmental and somatic dysfunction of pelvic region: Secondary | ICD-10-CM | POA: Diagnosis not present

## 2024-02-28 DIAGNOSIS — M9901 Segmental and somatic dysfunction of cervical region: Secondary | ICD-10-CM | POA: Diagnosis not present

## 2024-02-28 DIAGNOSIS — M9902 Segmental and somatic dysfunction of thoracic region: Secondary | ICD-10-CM | POA: Diagnosis not present

## 2024-02-29 MED ORDER — MECLIZINE 12.5 MG TABLET
12.5 | ORAL_TABLET | Freq: Three times a day (TID) | ORAL | 1 refills | 10.00000 days | Status: AC | PRN
Start: 2024-02-29 — End: ?

## 2024-02-29 MED ORDER — AMOXICILLIN 500 MG CAPSULE
500 | ORAL_CAPSULE | Freq: Three times a day (TID) | ORAL | 1 refills | 7.50000 days | Status: AC
Start: 2024-02-29 — End: ?

## 2024-03-04 DIAGNOSIS — E669 Obesity, unspecified: Secondary | ICD-10-CM | POA: Diagnosis not present

## 2024-03-04 DIAGNOSIS — M545 Low back pain, unspecified: Secondary | ICD-10-CM | POA: Diagnosis not present

## 2024-03-04 DIAGNOSIS — M9902 Segmental and somatic dysfunction of thoracic region: Secondary | ICD-10-CM | POA: Diagnosis not present

## 2024-03-04 DIAGNOSIS — R293 Abnormal posture: Secondary | ICD-10-CM | POA: Diagnosis not present

## 2024-03-04 DIAGNOSIS — M9903 Segmental and somatic dysfunction of lumbar region: Secondary | ICD-10-CM | POA: Diagnosis not present

## 2024-03-04 DIAGNOSIS — M9901 Segmental and somatic dysfunction of cervical region: Secondary | ICD-10-CM | POA: Diagnosis not present

## 2024-03-04 DIAGNOSIS — M791 Myalgia, unspecified site: Secondary | ICD-10-CM | POA: Diagnosis not present

## 2024-03-04 DIAGNOSIS — M9905 Segmental and somatic dysfunction of pelvic region: Secondary | ICD-10-CM | POA: Diagnosis not present

## 2024-03-06 DIAGNOSIS — M545 Low back pain, unspecified: Secondary | ICD-10-CM | POA: Diagnosis not present

## 2024-03-06 DIAGNOSIS — M9902 Segmental and somatic dysfunction of thoracic region: Secondary | ICD-10-CM | POA: Diagnosis not present

## 2024-03-06 DIAGNOSIS — M791 Myalgia, unspecified site: Secondary | ICD-10-CM | POA: Diagnosis not present

## 2024-03-06 DIAGNOSIS — M9905 Segmental and somatic dysfunction of pelvic region: Secondary | ICD-10-CM | POA: Diagnosis not present

## 2024-03-06 DIAGNOSIS — R293 Abnormal posture: Secondary | ICD-10-CM | POA: Diagnosis not present

## 2024-03-06 DIAGNOSIS — M9903 Segmental and somatic dysfunction of lumbar region: Secondary | ICD-10-CM | POA: Diagnosis not present

## 2024-03-06 DIAGNOSIS — M9901 Segmental and somatic dysfunction of cervical region: Secondary | ICD-10-CM | POA: Diagnosis not present

## 2024-03-06 DIAGNOSIS — E669 Obesity, unspecified: Secondary | ICD-10-CM | POA: Diagnosis not present

## 2024-03-07 ENCOUNTER — Ambulatory Visit: Admit: 2024-03-07 | Payer: PRIVATE HEALTH INSURANCE | Attending: Obstetrics and Gynecology | Primary: Internal Medicine

## 2024-03-07 ENCOUNTER — Encounter: Admit: 2024-03-07 | Payer: PRIVATE HEALTH INSURANCE | Attending: Obstetrics and Gynecology | Primary: Internal Medicine

## 2024-03-07 VITALS — BP 126/74 | Wt 131.0 lb

## 2024-03-07 DIAGNOSIS — Z01419 Encounter for gynecological examination (general) (routine) without abnormal findings: Secondary | ICD-10-CM

## 2024-03-07 DIAGNOSIS — K5790 Diverticulosis of intestine, part unspecified, without perforation or abscess without bleeding: Secondary | ICD-10-CM

## 2024-03-07 DIAGNOSIS — G2581 Restless legs syndrome: Secondary | ICD-10-CM

## 2024-03-07 DIAGNOSIS — Z780 Asymptomatic menopausal state: Secondary | ICD-10-CM

## 2024-03-07 DIAGNOSIS — E785 Hyperlipidemia, unspecified: Secondary | ICD-10-CM

## 2024-03-07 DIAGNOSIS — K648 Other hemorrhoids: Secondary | ICD-10-CM

## 2024-03-07 DIAGNOSIS — Z13820 Encounter for screening for osteoporosis: Secondary | ICD-10-CM

## 2024-03-07 DIAGNOSIS — T7840XA Allergy, unspecified, initial encounter: Secondary | ICD-10-CM

## 2024-03-07 DIAGNOSIS — R9389 Abnormal findings on diagnostic imaging of other specified body structures: Secondary | ICD-10-CM

## 2024-03-07 DIAGNOSIS — D369 Benign neoplasm, unspecified site: Secondary | ICD-10-CM

## 2024-03-07 DIAGNOSIS — Z1239 Encounter for other screening for malignant neoplasm of breast: Secondary | ICD-10-CM

## 2024-03-07 DIAGNOSIS — R923 Dense breasts: Secondary | ICD-10-CM

## 2024-03-07 NOTE — Progress Notes
 [facility]   WOMEN'S HEALTHCARE ASSOCIATESANNUALPRESENTATION HISTORY: Cipriana Spivey is a 63 y.o. [phone] who attends for an annual well woman exam. Patient reports no history of abnormal pap smears. She had menopause at age 67 and denies PMB. Never used HRT. She does perform SBE. She does have dense breast tissue. Due for imaging in October. She denies family history of breast or gynecological cancers. She does take vitamin d and exercises occasionally. She is up to date with colpomicroscopy. REVIEW OF MEDICAL HISTORY/MEDICATIONS/ALLERGIES: ObHx:OB History Gravida Para Term Preterm AB Living 2 2 1 1  0 2 SAB IAB Ectopic Molar Multiple Live Births 0 0 0 0 0 2  # Outcome Date GA Lbr Len/2nd Weight Sex Type Anes PTL Lv 2 Term 1991 [redacted]w[redacted]d   M VBAC   LIV 1 Preterm 1990 [redacted]w[redacted]d   M CS-Unspec   LIV GynHx:Contraceptive History         Menstrual Cycle No LMP recorded. Patient is postmenopausal.                Sexual History          AVW:UJWJ Medical History: Diagnosis Date  Abnormal ultrasound   3/25 prominent pancreatic duct  Allergy   Seasonal  Diverticulosis   Hemorrhoids, internal   Hyperlipidemia   Restless legs   ?  Tubular adenoma 09/14/2022 XBJ:[id number] Surgical History: Procedure Laterality Date  b/l bunionectomy    BREAST BIOPSY Right 05/2013  benign  carpal tunnel ; b/l    CESAREAN SECTION    COLONOSCOPY  09/14/2022  52yr recall  repair ? perforated ulcer at birth   Meds: Prior to Admission medications  Medication Sig Start Date End Date Taking? Authorizing Provider ALPRAZolam  (XANAX ) 0.5 mg tablet Take 1 tablet (0.5 mg total) by mouth nightly as needed for anxiety. 01/07/24  Yes Tortorello, Drexel Gentles, MD atorvastatin  (LIPITOR) 10 mg tablet Take 1 tablet (10 mg total) by mouth daily. 06/21/23  Yes Wise, Krista Elizabeth, APRN calcium carbonate-vitamin D3 600 mg-20 mcg (800 unit) Tab Take by mouth.   Yes Provider, Historical fluticasone  propionate (FLONASE ) 50 mcg/actuation nasal spray Use 2 sprays in each nostril daily. 10/29/23  Yes Ganser, Festus Hubert, APRN magnesium glycinate (MAG GLYCINATE) 100 mg Tab 1-2 pills at night. 11/14/23  Yes Tortorello, Drexel Gentles, MD meclizine  (ANTIVERT ) 12.5 mg tablet Take 1 tablet (12.5 mg total) by mouth 3 (three) times daily as needed for dizziness. 02/29/24  Yes Ruszkowski, Kristin, APRN multivitamin capsule Take 1 capsule by mouth daily.   Yes Provider, Historical omega-3 fatty acids-fish oil  (FISH OIL ) 340-1,000 mg per capsule Take 1 capsule by mouth daily. 01/28/20  Yes Tortorello, Drexel Gentles, MD amoxicillin  (AMOXIL ) 500 mg capsule Take 1 capsule (500 mg total) by mouth 3 (three) times daily for 7 days. 02/29/24 03/07/24  Ruszkowski, Kristin, APRN Allergies:Kiwi and Sulfa (sulfonamide antibiotics)FH:family history includes Coronary Artery Disease in her brother and mother; Heart attack in her brother; Heart disease in her brother, father, and mother; High cholesterol in her mother; Hypertension in her mother; Kidney failure in her father; Lung cancer in her father; No Known Problems in her son and son; Throat cancer in her father.NF:AOZHYQ etoh, Former cigarette smoker, Denies recreational drug useREVIEW OF SYSTEMS: Review of Systems Constitutional: Negative.  Respiratory: Negative.   Cardiovascular: Negative.  Gastrointestinal: Negative.  Genitourinary: Negative.  Skin: Negative.  Neurological: Negative.  Psychiatric/Behavioral:  The patient is nervous/anxious.   PHYSICAL EXAM: Vitals:BP 126/74  - Wt 59.4 kg  - BMI 23.96 kg/m? Physical ExamGeneral: comfortable, A&OBreast:  normal to inspection and palpation, no masses appreciated, no axillary lymphadenopathy b/l Cardiac: RRRPulm: CTABAbdomen: soft, non-tenderExtremities: nil edema, nil calf tendernessSpeculum exam: normal vulva, urethrae normal, atrophic vagina without lesions, cervix: normal appearance, no lesions, no discharge and no cervical motion tendernessBimanual exam: small uterus, no adnexal masses appreciatedRectal exam: deferredSensitive portions of physical exam were performed with a chaperone present Shirlee Dotter, MA)REVIEW OF LABS/DIAGNOSTICS: Labs: N/aImaging: N/aASSESSMENT: Joscelin Litke is a 63 y.o. [phone] who presents to the office for an annual well woman visit. PLAN: Annual Well Woman VisitPap Smear: collectedHPV Vaccine: receivedBreast CA Screening (Mammogram and/or US ): Up to date, ordered for next yearColon CA Screening: Up to dateDEXA (65 or risk factors): OrderedBirth Control Method: N/ASTD Screening: GCCT not indicated, Blood work not indicatedEducationImportance of PCP and yearly physicalsImportance of yearly skin checks with DermatologyBone health reviewed Healthy lifestyle choices, diet, and exerciseBreast self awarenessSafe Sex practicesFariha Meredeth Stallion, DOObstetrics and GynecologySouthern Seneca  [facility] AssociatesY[facility]5/06/2024 - 2:05 PM

## 2024-03-11 DIAGNOSIS — R293 Abnormal posture: Secondary | ICD-10-CM | POA: Diagnosis not present

## 2024-03-11 DIAGNOSIS — M545 Low back pain, unspecified: Secondary | ICD-10-CM | POA: Diagnosis not present

## 2024-03-11 DIAGNOSIS — M9903 Segmental and somatic dysfunction of lumbar region: Secondary | ICD-10-CM | POA: Diagnosis not present

## 2024-03-11 DIAGNOSIS — M9902 Segmental and somatic dysfunction of thoracic region: Secondary | ICD-10-CM | POA: Diagnosis not present

## 2024-03-11 DIAGNOSIS — M9905 Segmental and somatic dysfunction of pelvic region: Secondary | ICD-10-CM | POA: Diagnosis not present

## 2024-03-11 DIAGNOSIS — M9901 Segmental and somatic dysfunction of cervical region: Secondary | ICD-10-CM | POA: Diagnosis not present

## 2024-03-11 DIAGNOSIS — M791 Myalgia, unspecified site: Secondary | ICD-10-CM | POA: Diagnosis not present

## 2024-03-11 DIAGNOSIS — E669 Obesity, unspecified: Secondary | ICD-10-CM | POA: Diagnosis not present

## 2024-03-13 DIAGNOSIS — M9903 Segmental and somatic dysfunction of lumbar region: Secondary | ICD-10-CM | POA: Diagnosis not present

## 2024-03-13 DIAGNOSIS — R293 Abnormal posture: Secondary | ICD-10-CM | POA: Diagnosis not present

## 2024-03-13 DIAGNOSIS — M545 Low back pain, unspecified: Secondary | ICD-10-CM | POA: Diagnosis not present

## 2024-03-13 DIAGNOSIS — M791 Myalgia, unspecified site: Secondary | ICD-10-CM | POA: Diagnosis not present

## 2024-03-13 DIAGNOSIS — M9905 Segmental and somatic dysfunction of pelvic region: Secondary | ICD-10-CM | POA: Diagnosis not present

## 2024-03-13 DIAGNOSIS — E669 Obesity, unspecified: Secondary | ICD-10-CM | POA: Diagnosis not present

## 2024-03-13 DIAGNOSIS — M9901 Segmental and somatic dysfunction of cervical region: Secondary | ICD-10-CM | POA: Diagnosis not present

## 2024-03-13 DIAGNOSIS — M9902 Segmental and somatic dysfunction of thoracic region: Secondary | ICD-10-CM | POA: Diagnosis not present

## 2024-03-14 MED ORDER — ATORVASTATIN 10 MG TABLET
10 | ORAL_TABLET | Freq: Every day | ORAL | 3 refills | 90.00000 days | Status: AC
Start: 2024-03-14 — End: ?

## 2024-03-18 DIAGNOSIS — M791 Myalgia, unspecified site: Secondary | ICD-10-CM | POA: Diagnosis not present

## 2024-03-18 DIAGNOSIS — R293 Abnormal posture: Secondary | ICD-10-CM | POA: Diagnosis not present

## 2024-03-18 DIAGNOSIS — M9905 Segmental and somatic dysfunction of pelvic region: Secondary | ICD-10-CM | POA: Diagnosis not present

## 2024-03-18 DIAGNOSIS — M9901 Segmental and somatic dysfunction of cervical region: Secondary | ICD-10-CM | POA: Diagnosis not present

## 2024-03-18 DIAGNOSIS — M9903 Segmental and somatic dysfunction of lumbar region: Secondary | ICD-10-CM | POA: Diagnosis not present

## 2024-03-18 DIAGNOSIS — M9902 Segmental and somatic dysfunction of thoracic region: Secondary | ICD-10-CM | POA: Diagnosis not present

## 2024-03-18 DIAGNOSIS — E669 Obesity, unspecified: Secondary | ICD-10-CM | POA: Diagnosis not present

## 2024-03-18 DIAGNOSIS — M545 Low back pain, unspecified: Secondary | ICD-10-CM | POA: Diagnosis not present

## 2024-03-20 DIAGNOSIS — E669 Obesity, unspecified: Secondary | ICD-10-CM | POA: Diagnosis not present

## 2024-03-20 DIAGNOSIS — M9903 Segmental and somatic dysfunction of lumbar region: Secondary | ICD-10-CM | POA: Diagnosis not present

## 2024-03-20 DIAGNOSIS — M9902 Segmental and somatic dysfunction of thoracic region: Secondary | ICD-10-CM | POA: Diagnosis not present

## 2024-03-20 DIAGNOSIS — R293 Abnormal posture: Secondary | ICD-10-CM | POA: Diagnosis not present

## 2024-03-20 DIAGNOSIS — M791 Myalgia, unspecified site: Secondary | ICD-10-CM | POA: Diagnosis not present

## 2024-03-20 DIAGNOSIS — M9905 Segmental and somatic dysfunction of pelvic region: Secondary | ICD-10-CM | POA: Diagnosis not present

## 2024-03-20 DIAGNOSIS — M9901 Segmental and somatic dysfunction of cervical region: Secondary | ICD-10-CM | POA: Diagnosis not present

## 2024-03-20 DIAGNOSIS — M545 Low back pain, unspecified: Secondary | ICD-10-CM | POA: Diagnosis not present

## 2024-03-25 DIAGNOSIS — M9903 Segmental and somatic dysfunction of lumbar region: Secondary | ICD-10-CM | POA: Diagnosis not present

## 2024-03-25 DIAGNOSIS — M9905 Segmental and somatic dysfunction of pelvic region: Secondary | ICD-10-CM | POA: Diagnosis not present

## 2024-03-25 DIAGNOSIS — M9902 Segmental and somatic dysfunction of thoracic region: Secondary | ICD-10-CM | POA: Diagnosis not present

## 2024-03-25 DIAGNOSIS — M9901 Segmental and somatic dysfunction of cervical region: Secondary | ICD-10-CM | POA: Diagnosis not present

## 2024-03-27 DIAGNOSIS — M9905 Segmental and somatic dysfunction of pelvic region: Secondary | ICD-10-CM | POA: Diagnosis not present

## 2024-03-27 DIAGNOSIS — M9902 Segmental and somatic dysfunction of thoracic region: Secondary | ICD-10-CM | POA: Diagnosis not present

## 2024-03-27 DIAGNOSIS — M9903 Segmental and somatic dysfunction of lumbar region: Secondary | ICD-10-CM | POA: Diagnosis not present

## 2024-03-27 DIAGNOSIS — M9901 Segmental and somatic dysfunction of cervical region: Secondary | ICD-10-CM | POA: Diagnosis not present

## 2024-04-01 DIAGNOSIS — M9902 Segmental and somatic dysfunction of thoracic region: Secondary | ICD-10-CM | POA: Diagnosis not present

## 2024-04-01 DIAGNOSIS — M9901 Segmental and somatic dysfunction of cervical region: Secondary | ICD-10-CM | POA: Diagnosis not present

## 2024-04-01 DIAGNOSIS — M9905 Segmental and somatic dysfunction of pelvic region: Secondary | ICD-10-CM | POA: Diagnosis not present

## 2024-04-01 DIAGNOSIS — M9903 Segmental and somatic dysfunction of lumbar region: Secondary | ICD-10-CM | POA: Diagnosis not present

## 2024-04-08 DIAGNOSIS — M9901 Segmental and somatic dysfunction of cervical region: Secondary | ICD-10-CM | POA: Diagnosis not present

## 2024-04-08 DIAGNOSIS — M9902 Segmental and somatic dysfunction of thoracic region: Secondary | ICD-10-CM | POA: Diagnosis not present

## 2024-04-08 DIAGNOSIS — M9903 Segmental and somatic dysfunction of lumbar region: Secondary | ICD-10-CM | POA: Diagnosis not present

## 2024-04-08 DIAGNOSIS — M9905 Segmental and somatic dysfunction of pelvic region: Secondary | ICD-10-CM | POA: Diagnosis not present

## 2024-04-15 ENCOUNTER — Inpatient Hospital Stay: Admit: 2024-04-15 | Discharge: 2024-04-15 | Payer: Managed Care (Private) | Primary: Internal Medicine

## 2024-04-15 DIAGNOSIS — Z0000 Encounter for general adult medical examination without abnormal findings: Secondary | ICD-10-CM

## 2024-04-15 LAB — COMPREHENSIVE METABOLIC PANEL
BKR A/G RATIO: 1.5 (ref 1.0–2.2)
BKR ALANINE AMINOTRANSFERASE (ALT): 24 U/L (ref 10–35)
BKR ALBUMIN: 4.7 g/dL (ref 3.6–5.1)
BKR ALKALINE PHOSPHATASE: 68 U/L (ref 9–122)
BKR ANION GAP: 12 (ref 7–17)
BKR ASPARTATE AMINOTRANSFERASE (AST): 29 U/L (ref 10–35)
BKR AST/ALT RATIO: 1.2
BKR BILIRUBIN TOTAL: 0.8 mg/dL (ref <=1.2)
BKR BLOOD UREA NITROGEN: 13 mg/dL (ref 8–23)
BKR BUN / CREAT RATIO: 22.4 (ref 8.0–23.0)
BKR CALCIUM: 9.8 mg/dL (ref 8.8–10.2)
BKR CHLORIDE: 103 mmol/L (ref 98–107)
BKR CO2: 27 mmol/L (ref 20–30)
BKR CREATININE DELTA: -0.04
BKR CREATININE: 0.58 mg/dL (ref 0.40–1.30)
BKR EGFR, CREATININE (CKD-EPI 2021): >60 mL/min/{1.73_m2} (ref >=60)
BKR GLOBULIN: 3.2 g/dL (ref 2.0–3.9)
BKR GLUCOSE: 94 mg/dL (ref 70–100)
BKR POTASSIUM: 4.2 mmol/L (ref 3.3–5.3)
BKR PROTEIN TOTAL: 7.9 g/dL (ref 5.9–8.3)
BKR SODIUM: 142 mmol/L (ref 136–144)

## 2024-04-15 LAB — LIPID PANEL
BKR CHOLESTEROL/HDL RATIO: 3.3 (ref 0.0–5.0)
BKR CHOLESTEROL: 193 mg/dL
BKR HDL CHOLESTEROL: 58 mg/dL (ref >=40)
BKR LDL CHOLESTEROL SAMPSON CALCULATED: 108 mg/dL — ABNORMAL HIGH
BKR TRIGLYCERIDES: 157 mg/dL — ABNORMAL HIGH

## 2024-04-15 LAB — CBC WITH AUTO DIFFERENTIAL
BKR WAM ABSOLUTE IMMATURE GRANULOCYTES.: 0.01 x 1000/ÂµL (ref 0.00–0.30)
BKR WAM ABSOLUTE LYMPHOCYTE COUNT.: 1.4 x 1000/ÂµL (ref 0.60–3.70)
BKR WAM ABSOLUTE NRBC: 0 x 1000/ÂµL (ref 0.00–1.00)
BKR WAM ANC (ABSOLUTE NEUTROPHIL COUNT): 3.45 x 1000/ÂµL (ref 2.00–7.60)
BKR WAM BASOPHIL ABSOLUTE COUNT.: 0.05 x 1000/ÂµL (ref 0.00–1.00)
BKR WAM BASOPHILS: 0.9 % (ref 0.0–1.4)
BKR WAM EOSINOPHIL ABSOLUTE COUNT.: 0.14 x 1000/ÂµL (ref 0.00–1.00)
BKR WAM EOSINOPHILS: 2.5 % (ref 0.0–5.0)
BKR WAM HEMATOCRIT: 45.7 % — ABNORMAL HIGH (ref 35.00–45.00)
BKR WAM HEMOGLOBIN: 14.2 g/dL (ref 11.7–15.5)
BKR WAM IMMATURE GRANULOCYTES: 0.2 % (ref 0.0–1.0)
BKR WAM LYMPHOCYTES: 25 % (ref 17.0–50.0)
BKR WAM MCH: 28.6 pg (ref 27.0–33.0)
BKR WAM MCHC: 31.1 g/dL (ref 31.0–36.0)
BKR WAM MCV: 92.1 fL (ref 80.0–100.0)
BKR WAM MONOCYTE ABSOLUTE COUNT.: 0.55 x 1000/ÂµL (ref 0.00–1.00)
BKR WAM MONOCYTES: 9.8 % (ref 4.0–12.0)
BKR WAM MPV: 10.9 fL (ref 8.0–12.0)
BKR WAM NEUTROPHILS: 61.6 % (ref 39.0–72.0)
BKR WAM NUCLEATED RED BLOOD CELLS: 0 % (ref 0.0–1.0)
BKR WAM PLATELETS: 315 x1000/ÂµL (ref 150–420)
BKR WAM RDW-CV: 13 % (ref 11.0–15.0)
BKR WAM RED BLOOD CELL COUNT.: 4.96 M/ÂµL (ref 4.00–6.00)
BKR WAM WHITE BLOOD CELL COUNT: 5.6 x1000/ÂµL (ref 4.0–11.0)

## 2024-04-15 LAB — URINALYSIS WITH CULTURE REFLEX      (BH LMW YH)
BKR BILIRUBIN, UA: NEGATIVE
BKR BLOOD, UA: NEGATIVE
BKR GLUCOSE, UA: NEGATIVE
BKR KETONES, UA: NEGATIVE
BKR LEUKOCYTE ESTERASE, UA: NEGATIVE
BKR NITRITE, UA: NEGATIVE
BKR PH, UA: 7 (ref 5.5–7.5)
BKR PROTEIN, UA: NEGATIVE
BKR SPECIFIC GRAVITY, UA: 1.004 — ABNORMAL LOW (ref 1.005–1.030)
BKR UROBILINOGEN, UA: <2 mg/dL (ref <=2.0)

## 2024-04-15 LAB — HEMOGLOBIN A1C
BKR ESTIMATED AVERAGE GLUCOSE: 114 mg/dL
BKR HEMOGLOBIN A1C: 5.6 % (ref 4.0–5.6)

## 2024-04-15 LAB — UA REFLEX CULTURE

## 2024-04-15 LAB — VITAMIN D, 25-HYDROXY: BKR VITAMIN D 25-HYDROXY: 44.3 ng/mL (ref 30.0–100.0)

## 2024-04-15 LAB — TSH W/REFLEX TO FT4     (BH GH LMW Q YH): BKR THYROID STIMULATING HORMONE: 1.84 u[IU]/mL

## 2024-04-22 ENCOUNTER — Encounter: Admit: 2024-04-22 | Payer: PRIVATE HEALTH INSURANCE | Attending: Internal Medicine | Primary: Internal Medicine

## 2024-05-07 MED ORDER — METHYLPREDNISOLONE ACETATE 40 MG/ML SUSPENSION FOR INJECTION
40 | Freq: Once | INTRA_ARTICULAR | Status: CP | PRN
Start: 2024-05-07 — End: ?
  Administered 2024-05-07: 17:00:00 40 mg/mL via INTRA_ARTICULAR

## 2024-05-07 MED ORDER — TRIAMCINOLONE ACETONIDE 40 MG/ML SUSPENSION FOR INJECTION
40 | Freq: Once | INTRA_ARTICULAR | Status: CP | PRN
Start: 2024-05-07 — End: ?
  Administered 2024-05-07: 17:00:00 40 mg/mL via INTRA_ARTICULAR

## 2024-05-29 ENCOUNTER — Inpatient Hospital Stay: Admit: 2024-05-29 | Discharge: 2024-05-29 | Payer: Managed Care (Private) | Primary: Internal Medicine

## 2024-05-29 DIAGNOSIS — M25539 Pain in unspecified wrist: Principal | ICD-10-CM

## 2024-05-29 LAB — URIC ACID: BKR URIC ACID: 3.9 mg/dL (ref 2.7–7.3)

## 2024-06-06 MED ORDER — ALPRAZOLAM 0.5 MG TABLET
0.5 | ORAL_TABLET | Freq: Every evening | ORAL | 2 refills | 30.00000 days | Status: AC | PRN
Start: 2024-06-06 — End: 2024-07-15

## 2024-06-19 DIAGNOSIS — R7303 Prediabetes: Secondary | ICD-10-CM | POA: Diagnosis not present

## 2024-06-19 DIAGNOSIS — Z8571 Personal history of Hodgkin lymphoma: Secondary | ICD-10-CM | POA: Diagnosis not present

## 2024-06-19 DIAGNOSIS — R7309 Other abnormal glucose: Secondary | ICD-10-CM | POA: Diagnosis not present

## 2024-06-20 DIAGNOSIS — R7309 Other abnormal glucose: Secondary | ICD-10-CM | POA: Diagnosis not present

## 2024-06-25 MED ORDER — METHYLPREDNISOLONE 4 MG TABLETS IN A DOSE PACK
4 | ORAL_TABLET | ORAL | 1 refills | 6.00000 days | Status: AC
Start: 2024-06-25 — End: ?

## 2024-06-27 ENCOUNTER — Inpatient Hospital Stay: Admit: 2024-06-27 | Discharge: 2024-06-27 | Payer: PRIVATE HEALTH INSURANCE | Primary: Internal Medicine

## 2024-06-27 DIAGNOSIS — Z13820 Encounter for screening for osteoporosis: Secondary | ICD-10-CM

## 2024-06-27 DIAGNOSIS — Z0000 Encounter for general adult medical examination without abnormal findings: Principal | ICD-10-CM

## 2024-07-15 MED ORDER — ALPRAZOLAM 0.5 MG TABLET
0.5 | ORAL_TABLET | Freq: Three times a day (TID) | ORAL | 4 refills | 30.00000 days | Status: AC | PRN
Start: 2024-07-15 — End: ?

## 2024-08-08 ENCOUNTER — Encounter: Admit: 2024-08-08 | Payer: PRIVATE HEALTH INSURANCE | Attending: Internal Medicine | Primary: Internal Medicine

## 2024-08-08 DIAGNOSIS — R922 Inconclusive mammogram: Secondary | ICD-10-CM

## 2024-08-08 DIAGNOSIS — M542 Cervicalgia: Principal | ICD-10-CM

## 2024-08-10 ENCOUNTER — Encounter: Admit: 2024-08-10 | Payer: PRIVATE HEALTH INSURANCE | Attending: Obstetrics and Gynecology | Primary: Internal Medicine

## 2024-08-18 ENCOUNTER — Ambulatory Visit: Admit: 2024-08-18 | Payer: Medicare (Managed Care) | Attending: Family | Primary: Internal Medicine

## 2024-08-18 ENCOUNTER — Inpatient Hospital Stay: Admit: 2024-08-18 | Discharge: 2024-08-18 | Payer: PRIVATE HEALTH INSURANCE | Primary: Internal Medicine

## 2024-08-18 VITALS — BP 126/80 | HR 92 | Ht 61.5 in | Wt 126.0 lb

## 2024-08-18 DIAGNOSIS — R252 Cramp and spasm: Secondary | ICD-10-CM

## 2024-08-18 DIAGNOSIS — R634 Abnormal weight loss: Secondary | ICD-10-CM

## 2024-08-18 DIAGNOSIS — F419 Anxiety disorder, unspecified: Secondary | ICD-10-CM

## 2024-08-18 DIAGNOSIS — M2550 Pain in unspecified joint: Secondary | ICD-10-CM

## 2024-08-18 DIAGNOSIS — M542 Cervicalgia: Principal | ICD-10-CM

## 2024-08-18 DIAGNOSIS — M25511 Pain in right shoulder: Secondary | ICD-10-CM

## 2024-08-18 LAB — COMPREHENSIVE METABOLIC PANEL
BKR A/G RATIO: 1.6 (ref 1.0–2.2)
BKR ALANINE AMINOTRANSFERASE (ALT): 16 U/L (ref 10–35)
BKR ALBUMIN: 4.4 g/dL (ref 3.6–5.1)
BKR ALKALINE PHOSPHATASE: 76 U/L (ref 9–122)
BKR ANION GAP: 13 (ref 7–17)
BKR ASPARTATE AMINOTRANSFERASE (AST): 15 U/L (ref 10–35)
BKR AST/ALT RATIO: 0.9
BKR BILIRUBIN TOTAL: 0.7 mg/dL (ref <=1.2)
BKR BLOOD UREA NITROGEN: 13 mg/dL (ref 8–23)
BKR BUN / CREAT RATIO: 25 — ABNORMAL HIGH (ref 8.0–23.0)
BKR CALCIUM: 9.5 mg/dL (ref 8.8–10.2)
BKR CHLORIDE: 104 mmol/L (ref 98–107)
BKR CO2: 23 mmol/L (ref 20–30)
BKR CREATININE DELTA: -0.06
BKR CREATININE: 0.52 mg/dL (ref 0.40–1.30)
BKR EGFR, CREATININE (CKD-EPI 2021): >60 mL/min/1.73m2 (ref >=60)
BKR GLOBULIN: 2.8 g/dL (ref 2.0–3.9)
BKR GLUCOSE: 102 mg/dL — ABNORMAL HIGH (ref 70–100)
BKR POTASSIUM: 3.6 mmol/L (ref 3.3–5.3)
BKR PROTEIN TOTAL: 7.2 g/dL (ref 5.9–8.3)
BKR SODIUM: 140 mmol/L (ref 136–144)

## 2024-08-18 LAB — MAGNESIUM: BKR MAGNESIUM: 2 mg/dL (ref 1.7–2.4)

## 2024-08-18 LAB — CBC WITH AUTO DIFFERENTIAL
BKR WAM ABSOLUTE IMMATURE GRANULOCYTES.: 0.01 x 1000/ÂµL (ref 0.00–0.30)
BKR WAM ABSOLUTE LYMPHOCYTE COUNT.: 1.51 x 1000/ÂµL (ref 0.60–3.70)
BKR WAM ABSOLUTE NRBC: 0 x 1000/ÂµL (ref 0.00–1.00)
BKR WAM ANC (ABSOLUTE NEUTROPHIL COUNT): 5.35 x 1000/ÂµL (ref 2.00–7.60)
BKR WAM BASOPHIL ABSOLUTE COUNT.: 0.05 x 1000/ÂµL (ref 0.00–1.00)
BKR WAM BASOPHILS: 0.7 % (ref 0.0–1.4)
BKR WAM EOSINOPHIL ABSOLUTE COUNT.: 0.07 x 1000/ÂµL (ref 0.00–1.00)
BKR WAM EOSINOPHILS: 0.9 % (ref 0.0–5.0)
BKR WAM HEMATOCRIT: 43.1 % (ref 35.00–45.00)
BKR WAM HEMOGLOBIN: 13.3 g/dL (ref 11.7–15.5)
BKR WAM IMMATURE GRANULOCYTES: 0.1 % (ref 0.0–1.0)
BKR WAM LYMPHOCYTES: 19.9 % (ref 17.0–50.0)
BKR WAM MCH: 28.4 pg (ref 27.0–33.0)
BKR WAM MCHC: 30.9 g/dL — ABNORMAL LOW (ref 31.0–36.0)
BKR WAM MCV: 92.1 fL (ref 80.0–100.0)
BKR WAM MONOCYTE ABSOLUTE COUNT.: 0.59 x 1000/ÂµL (ref 0.00–1.00)
BKR WAM MONOCYTES: 7.8 % (ref 4.0–12.0)
BKR WAM MPV: 10.9 fL (ref 8.0–12.0)
BKR WAM NEUTROPHILS: 70.6 % (ref 39.0–72.0)
BKR WAM NUCLEATED RED BLOOD CELLS: 0 % (ref 0.0–1.0)
BKR WAM PLATELETS: 329 x1000/ÂµL (ref 150–420)
BKR WAM RDW-CV: 13.6 % (ref 11.0–15.0)
BKR WAM RED BLOOD CELL COUNT.: 4.68 M/ÂµL (ref 4.00–6.00)
BKR WAM WHITE BLOOD CELL COUNT: 7.6 x1000/ÂµL (ref 4.0–11.0)

## 2024-08-18 LAB — SEDIMENTATION RATE (ESR): BKR SEDIMENTATION RATE, ERYTHROCYTE: 28 mm/h — ABNORMAL HIGH (ref 0–20)

## 2024-08-18 LAB — CK     (BH GH L LMW YH): BKR CREATINE KINASE TOTAL: 35 U/L (ref 26–192)

## 2024-08-18 LAB — TSH W/REFLEX TO FT4     (BH GH LMW Q YH): BKR THYROID STIMULATING HORMONE: 1.69 u[IU]/mL

## 2024-08-18 MED ORDER — SERTRALINE 25 MG TABLET
25 | ORAL_TABLET | Freq: Every day | ORAL | 6 refills | 90.00000 days | Status: AC
Start: 2024-08-18 — End: ?

## 2024-08-19 ENCOUNTER — Encounter: Admit: 2024-08-19 | Payer: PRIVATE HEALTH INSURANCE | Attending: Family | Primary: Internal Medicine

## 2024-08-19 LAB — LYME ANTIBODIES W/RFLX TO CONFIRM (MODIFIED TWO-TIER TESTING)
BKR LYME TOTAL ANTIBODY INTERPRETATION: NEGATIVE
BKR LYME TOTAL ANTIBODY LI: 0.15 LI (ref <=0.90)

## 2024-08-27 ENCOUNTER — Encounter: Admit: 2024-08-27 | Payer: PRIVATE HEALTH INSURANCE | Attending: Family | Primary: Internal Medicine

## 2024-09-02 ENCOUNTER — Encounter: Admit: 2024-09-02 | Payer: PRIVATE HEALTH INSURANCE | Attending: Family | Primary: Internal Medicine

## 2024-09-04 ENCOUNTER — Encounter: Admit: 2024-09-04 | Payer: PRIVATE HEALTH INSURANCE | Primary: Internal Medicine

## 2024-09-05 ENCOUNTER — Telehealth: Admit: 2024-09-05 | Payer: PRIVATE HEALTH INSURANCE | Attending: Internal Medicine | Primary: Internal Medicine

## 2024-09-05 ENCOUNTER — Ambulatory Visit: Admit: 2024-09-05 | Payer: PRIVATE HEALTH INSURANCE | Primary: Internal Medicine

## 2024-09-05 ENCOUNTER — Other Ambulatory Visit: Payer: Self-pay

## 2024-09-05 ENCOUNTER — Emergency Department (HOSPITAL_COMMUNITY): Admission: EM | Admit: 2024-09-05 | Discharge: 2024-09-05 | Disposition: A | Discharge status: Home / Self Care

## 2024-09-05 ENCOUNTER — Encounter (HOSPITAL_COMMUNITY): Payer: Self-pay | Admitting: Emergency Medicine

## 2024-09-05 DIAGNOSIS — Z23 Encounter for immunization: Principal | ICD-10-CM

## 2024-09-05 DIAGNOSIS — M7989 Other specified soft tissue disorders: Secondary | ICD-10-CM | POA: Diagnosis present

## 2024-09-05 DIAGNOSIS — Z7982 Long term (current) use of aspirin: Secondary | ICD-10-CM | POA: Diagnosis not present

## 2024-09-05 DIAGNOSIS — L03115 Cellulitis of right lower limb: Secondary | ICD-10-CM | POA: Insufficient documentation

## 2024-09-05 DIAGNOSIS — L03119 Cellulitis of unspecified part of limb: Secondary | ICD-10-CM

## 2024-09-05 DIAGNOSIS — L03116 Cellulitis of left lower limb: Secondary | ICD-10-CM | POA: Diagnosis not present

## 2024-09-05 NOTE — ED Provider Notes (Signed)
 [place] EMERGENCY DEPARTMENT AT [facility] Provider Note   CSN: [id number] Arrival date & time: 09/05/24  1033     Patient presents with: Leg Swelling   [name] is a 63 y.o. female.   63 year old female presents for evaluation of bilateral lower extremity cellulitis.  Was diagnosed a few days ago and started on doxycycline .  States she called the office of her primary care doctor today and they told her to come to the ER as the doctor was not in.  She states overall the redness is improving, her swelling is improving and her pain is also improving.  States she still has some tingling at times.  She called the office today because her temperature was 94 on her thermometer in both ears at home and the primary care office told her to come to the ER for evaluation.  She states she has not had fevers, nausea, body aches or any other symptoms or concerns at this time.        Prior to Admission medications   Medication Sig Start Date End Date Taking? Authorizing Provider  acetaminophen  (TYLENOL ) 650 MG CR tablet Take 1,300 mg by mouth at bedtime.    [provider]  albuterol  (PROVENTIL  HFA;VENTOLIN  HFA) 108 (90 Base) MCG/ACT inhaler Inhale 2 puffs into the lungs every 4 (four) hours as needed for wheezing or shortness of breath. 07/22/17   Sofia, Leslie K, PA-C  amLODipine  (NORVASC ) 10 MG tablet Take 1 tablet (10 mg total) by mouth every evening. Patient taking differently: Take 10 mg by mouth at bedtime. 03/30/17   Love, Sharlet RAMAN, PA-C  aspirin  EC 81 MG tablet Take 81 mg by mouth daily. Swallow whole.    [provider]  benazepril -hydrochlorthiazide (LOTENSIN  HCT) 20-25 MG tablet Take 1 tablet by mouth in the morning. 06/05/17   [provider]  diclofenac  (VOLTAREN ) 75 MG EC tablet Take 75 mg by mouth 2 (two) times daily. 09/07/23   [provider]  diphenhydrAMINE  (BENADRYL ) 25 mg capsule Take 25 mg by mouth at bedtime.    [provider]  escitalopram  (LEXAPRO ) 20 MG tablet Take 20 mg by mouth at bedtime.    [provider]  fenofibrate 160 MG tablet Take 160 mg by mouth daily.    [provider]  fexofenadine (ALLEGRA) 180 MG tablet Take 180 mg by mouth in the morning.    [provider]  gabapentin  (NEURONTIN ) 300 MG capsule Take 300 mg by mouth at bedtime.    [provider]  levothyroxine  (SYNTHROID ) 75 MCG tablet Take 75 mcg by mouth daily before breakfast.    [provider]  Multiple Vitamin (MULTIVITAMIN WITH MINERALS) TABS tablet Take 1 tablet by mouth in the morning.    [provider]  Omega-3 Fatty Acids (FISH OIL PO) Take 1 capsule by mouth in the morning and at bedtime.    [provider]  oxyCODONE  (ROXICODONE ) 5 MG immediate release tablet Take 0.5-1 tablets (2.5-5 mg total) by mouth every 6 (six) hours as needed for severe pain (pain score 7-10). 08/22/23   Harris, Abigail, PA-C  polyethylene glycol (MIRALAX  / GLYCOLAX ) 17 g packet Take 17 g by mouth daily as needed for moderate constipation.    [provider]  rosuvastatin  (CRESTOR ) 20 MG tablet Take 20 mg by mouth every evening.    [provider]    Allergies: Cephalexin, Iodine -131, Codeine, and Contrast media [iodinated contrast media]    Review  of Systems  Constitutional:  Negative for chills and fever.  HENT:  Negative for ear pain and sore throat.   Eyes:  Negative for pain and visual disturbance.  Respiratory:  Negative for cough and shortness of breath.   Cardiovascular:  Negative for chest pain and palpitations.  Gastrointestinal:  Negative for abdominal pain and vomiting.  Genitourinary:  Negative for dysuria and hematuria.  Musculoskeletal:  Negative for arthralgias and back pain.  Skin:  Negative for color change and rash.  Neurological:  Negative for seizures and syncope.  All other systems reviewed and are negative.   Updated Vital Signs BP  (!) 144/57   Pulse 93   Temp 98 F (36.7 C) (Oral)   Ht 5' 4 (1.626 m)   Wt 95.3 kg   SpO2 94%   BMI 36.05 kg/m   Physical Exam Vitals and nursing note reviewed.  Constitutional:      General: She is not in acute distress.    Appearance: Normal appearance. She is well-developed. She is not ill-appearing.  HENT:     Head: Normocephalic and atraumatic.  Eyes:     Conjunctiva/sclera: Conjunctivae normal.  Cardiovascular:     Rate and Rhythm: Normal rate and regular rhythm.     Heart sounds: No murmur heard. Pulmonary:     Effort: Pulmonary effort is normal. No respiratory distress.     Breath sounds: Normal breath sounds.  Abdominal:     Palpations: Abdomen is soft.     Tenderness: There is no abdominal tenderness.  Musculoskeletal:        General: No swelling.     Cervical back: Neck supple.  Skin:    General: Skin is warm and dry.     Capillary Refill: Capillary refill takes less than 2 seconds.  Neurological:     Mental Status: She is alert.  Psychiatric:        Mood and Affect: Mood normal.     (all labs ordered are listed, but only abnormal results are displayed) Labs Reviewed - No data to display  EKG: None  Radiology: No results found.   Procedures   Medications Ordered in the ED - No data to display                                  Medical Decision Making Patient here after she was told to come to the ER by primary care for a temperature of 94 at home.  Here her temperature is 98, she has not had any fevers or chills at home and is otherwise feeling well.  Her cellulitis overall appears to be healing, there is minimal redness but some warmth.  Her pain is improving as well.  Advised to continue her doxycycline , take Tylenol  Motrin as needed for pain and follow-up with primary care as needed otherwise return to the ER for new or worsening symptoms.  She feels comfortable to plan to be discharged home.  Problems Addressed: Cellulitis of lower  extremity, unspecified laterality: acute illness or injury  Amount and/or Complexity of Data Reviewed External Data Reviewed: notes.    Details: Urgent care records reviewed and patient was started on doxycycline  for bilateral lower extremity cellulitis  Risk OTC drugs. Prescription drug management.     Final diagnoses:  Cellulitis of lower extremity, unspecified laterality    ED Discharge Orders     None  Gennaro Duwaine CROME, DO 09/05/24 1115

## 2024-09-05 NOTE — ED Triage Notes (Signed)
 Pt in by POV for bilateral leg cellulitis. Was seen at [facility] on Tuesday and given antibiotics. Pt started with fever at home today. Both legs are swollen, red and warm to touch.

## 2024-09-05 NOTE — Discharge Instructions (Signed)
 You can use Tylenol  and Motrin as needed for pain.  Continue your doxycycline  as prescribed until you finish it.  Follow-up with your primary care doctor as needed.

## 2024-09-15 ENCOUNTER — Encounter: Admit: 2024-09-15 | Payer: PRIVATE HEALTH INSURANCE | Attending: Family | Primary: Internal Medicine

## 2024-09-22 ENCOUNTER — Encounter: Admit: 2024-09-22 | Payer: PRIVATE HEALTH INSURANCE | Attending: Internal Medicine | Primary: Internal Medicine

## 2024-09-22 DIAGNOSIS — M79641 Pain in right hand: Principal | ICD-10-CM

## 2024-09-29 ENCOUNTER — Encounter: Admit: 2024-09-29 | Payer: PRIVATE HEALTH INSURANCE | Attending: Internal Medicine | Primary: Internal Medicine

## 2024-09-29 MED ORDER — METHYLPREDNISOLONE 4 MG TABLETS IN A DOSE PACK
4 | ORAL_TABLET | ORAL | 1 refills | 6.00000 days | Status: AC
Start: 2024-09-29 — End: ?

## 2024-10-20 ENCOUNTER — Encounter: Admit: 2024-10-20 | Payer: PRIVATE HEALTH INSURANCE | Attending: Internal Medicine | Primary: Internal Medicine

## 2024-10-20 ENCOUNTER — Encounter: Admit: 2024-10-20 | Payer: Medicare (Managed Care) | Attending: Internal Medicine | Primary: Internal Medicine

## 2024-10-20 ENCOUNTER — Inpatient Hospital Stay: Admit: 2024-10-20 | Discharge: 2024-10-20 | Payer: Medicare (Managed Care) | Primary: Internal Medicine

## 2024-10-20 VITALS — BP 120/68 | HR 94 | Wt 128.0 lb

## 2024-10-20 DIAGNOSIS — T7840XA Allergy, unspecified, initial encounter: Secondary | ICD-10-CM

## 2024-10-20 DIAGNOSIS — M25511 Pain in right shoulder: Secondary | ICD-10-CM

## 2024-10-20 DIAGNOSIS — M2550 Pain in unspecified joint: Secondary | ICD-10-CM

## 2024-10-20 DIAGNOSIS — K5790 Diverticulosis of intestine, part unspecified, without perforation or abscess without bleeding: Secondary | ICD-10-CM

## 2024-10-20 DIAGNOSIS — E785 Hyperlipidemia, unspecified: Secondary | ICD-10-CM

## 2024-10-20 DIAGNOSIS — M79641 Pain in right hand: Secondary | ICD-10-CM

## 2024-10-20 DIAGNOSIS — K648 Other hemorrhoids: Secondary | ICD-10-CM

## 2024-10-20 DIAGNOSIS — D369 Benign neoplasm, unspecified site: Secondary | ICD-10-CM

## 2024-10-20 DIAGNOSIS — R9389 Abnormal findings on diagnostic imaging of other specified body structures: Secondary | ICD-10-CM

## 2024-10-20 DIAGNOSIS — G2581 Restless legs syndrome: Principal | ICD-10-CM

## 2024-10-20 LAB — RHEUMATOID FACTOR: BKR RHEUMATOID FACTOR: <10 [IU]/mL (ref <14.0)

## 2024-10-20 LAB — SEDIMENTATION RATE (ESR): BKR SEDIMENTATION RATE, ERYTHROCYTE: 42 mm/h — ABNORMAL HIGH (ref 0–20)

## 2024-10-20 LAB — C-REACTIVE PROTEIN     (CRP): BKR C-REACTIVE PROTEIN, HIGH SENSITIVITY: 41.8 mg/L — ABNORMAL HIGH

## 2024-10-20 MED ORDER — PREDNISONE 20 MG TABLET
20 | ORAL_TABLET | ORAL | 1 refills | 30.00000 days | Status: AC
Start: 2024-10-20 — End: ?

## 2024-10-31 ENCOUNTER — Encounter: Admit: 2024-10-31 | Payer: PRIVATE HEALTH INSURANCE | Attending: Internal Medicine | Primary: Internal Medicine

## 2024-10-31 DIAGNOSIS — M25511 Pain in right shoulder: Secondary | ICD-10-CM

## 2024-10-31 DIAGNOSIS — M2550 Pain in unspecified joint: Secondary | ICD-10-CM

## 2024-10-31 DIAGNOSIS — M79641 Pain in right hand: Principal | ICD-10-CM

## 2024-11-04 ENCOUNTER — Inpatient Hospital Stay: Admit: 2024-11-04 | Discharge: 2024-11-04 | Payer: Medicare (Managed Care) | Primary: Internal Medicine

## 2024-11-04 ENCOUNTER — Encounter: Admit: 2024-11-04 | Payer: PRIVATE HEALTH INSURANCE | Attending: Internal Medicine | Primary: Internal Medicine

## 2024-11-04 ENCOUNTER — Ambulatory Visit: Admit: 2024-11-04 | Payer: Medicare (Managed Care) | Attending: Internal Medicine | Primary: Internal Medicine

## 2024-11-04 ENCOUNTER — Telehealth: Admit: 2024-11-04 | Payer: PRIVATE HEALTH INSURANCE | Attending: Internal Medicine | Primary: Internal Medicine

## 2024-11-04 VITALS — BP 126/80 | Wt 130.6 lb

## 2024-11-04 DIAGNOSIS — K648 Other hemorrhoids: Secondary | ICD-10-CM

## 2024-11-04 DIAGNOSIS — G2581 Restless legs syndrome: Principal | ICD-10-CM

## 2024-11-04 DIAGNOSIS — R739 Hyperglycemia, unspecified: Secondary | ICD-10-CM

## 2024-11-04 DIAGNOSIS — M2550 Pain in unspecified joint: Principal | ICD-10-CM

## 2024-11-04 DIAGNOSIS — E785 Hyperlipidemia, unspecified: Secondary | ICD-10-CM

## 2024-11-04 DIAGNOSIS — D369 Benign neoplasm, unspecified site: Secondary | ICD-10-CM

## 2024-11-04 DIAGNOSIS — Z0000 Encounter for general adult medical examination without abnormal findings: Secondary | ICD-10-CM

## 2024-11-04 DIAGNOSIS — K5790 Diverticulosis of intestine, part unspecified, without perforation or abscess without bleeding: Secondary | ICD-10-CM

## 2024-11-04 DIAGNOSIS — R9389 Abnormal findings on diagnostic imaging of other specified body structures: Secondary | ICD-10-CM

## 2024-11-04 DIAGNOSIS — T7840XA Allergy, unspecified, initial encounter: Secondary | ICD-10-CM

## 2024-11-04 LAB — COMPREHENSIVE METABOLIC PANEL
BKR A/G RATIO: 1.3 (ref 1.0–2.2)
BKR ALANINE AMINOTRANSFERASE (ALT): 22 U/L (ref 10–35)
BKR ALBUMIN: 4.3 g/dL (ref 3.6–5.1)
BKR ALKALINE PHOSPHATASE: 86 U/L (ref 9–122)
BKR ANION GAP: 11 (ref 7–17)
BKR ASPARTATE AMINOTRANSFERASE (AST): 19 U/L (ref 10–35)
BKR AST/ALT RATIO: 0.9
BKR BILIRUBIN TOTAL: 0.4 mg/dL (ref <=1.2)
BKR BLOOD UREA NITROGEN: 18 mg/dL (ref 8–23)
BKR BUN / CREAT RATIO: 28.6 — ABNORMAL HIGH (ref 8.0–23.0)
BKR CALCIUM: 9.7 mg/dL (ref 8.8–10.2)
BKR CHLORIDE: 103 mmol/L (ref 98–107)
BKR CO2: 26 mmol/L (ref 20–30)
BKR CREATININE DELTA: 0.11
BKR CREATININE: 0.63 mg/dL (ref 0.40–1.30)
BKR EGFR, CREATININE (CKD-EPI 2021): >60 mL/min/1.73m2 (ref >=60)
BKR GLOBULIN: 3.2 g/dL (ref 2.0–3.9)
BKR GLUCOSE: 81 mg/dL (ref 70–100)
BKR POTASSIUM: 4.4 mmol/L (ref 3.3–5.3)
BKR PROTEIN TOTAL: 7.5 g/dL (ref 5.9–8.3)
BKR SODIUM: 140 mmol/L (ref 136–144)

## 2024-11-04 LAB — SEDIMENTATION RATE (ESR): BKR SEDIMENTATION RATE, ERYTHROCYTE: 41 mm/h — ABNORMAL HIGH (ref 0–20)

## 2024-11-04 LAB — CBC WITH AUTO DIFFERENTIAL
BKR WAM ABSOLUTE IMMATURE GRANULOCYTES.: 0.02 x 1000/ÂµL (ref 0.00–0.30)
BKR WAM ABSOLUTE LYMPHOCYTE COUNT.: 1.7 x 1000/ÂµL (ref 0.60–3.70)
BKR WAM ABSOLUTE NRBC: 0 x 1000/ÂµL (ref 0.00–1.00)
BKR WAM ANC (ABSOLUTE NEUTROPHIL COUNT): 4.61 x 1000/ÂµL (ref 2.00–7.60)
BKR WAM BASOPHIL ABSOLUTE COUNT.: 0.04 x 1000/ÂµL (ref 0.00–1.00)
BKR WAM BASOPHILS: 0.6 % (ref 0.0–1.4)
BKR WAM EOSINOPHIL ABSOLUTE COUNT.: 0.16 x 1000/ÂµL (ref 0.00–1.00)
BKR WAM EOSINOPHILS: 2.2 % (ref 0.0–5.0)
BKR WAM HEMATOCRIT: 42.8 % (ref 35.00–45.00)
BKR WAM HEMOGLOBIN: 12.8 g/dL (ref 11.7–15.5)
BKR WAM IMMATURE GRANULOCYTES: 0.3 % (ref 0.0–1.0)
BKR WAM LYMPHOCYTES: 23.5 % (ref 17.0–50.0)
BKR WAM MCH: 27.7 pg (ref 27.0–33.0)
BKR WAM MCHC: 29.9 g/dL — ABNORMAL LOW (ref 31.0–36.0)
BKR WAM MCV: 92.6 fL (ref 80.0–100.0)
BKR WAM MONOCYTE ABSOLUTE COUNT.: 0.7 x 1000/ÂµL (ref 0.00–1.00)
BKR WAM MONOCYTES: 9.7 % (ref 4.0–12.0)
BKR WAM MPV: 10.7 fL (ref 8.0–12.0)
BKR WAM NEUTROPHILS: 63.7 % (ref 39.0–72.0)
BKR WAM NUCLEATED RED BLOOD CELLS: 0 % (ref 0.0–1.0)
BKR WAM PLATELETS: 330 x1000/ÂµL (ref 150–420)
BKR WAM RDW-CV: 13.7 % (ref 11.0–15.0)
BKR WAM RED BLOOD CELL COUNT.: 4.62 M/ÂµL (ref 4.00–6.00)
BKR WAM WHITE BLOOD CELL COUNT: 7.2 x1000/ÂµL (ref 4.0–11.0)

## 2024-11-04 LAB — C-REACTIVE PROTEIN     (CRP): BKR C-REACTIVE PROTEIN, HIGH SENSITIVITY: 11.5 mg/L — ABNORMAL HIGH

## 2024-11-04 MED ORDER — PANTOPRAZOLE 40 MG TABLET,DELAYED RELEASE
40 | ORAL_TABLET | Freq: Every day | ORAL | 1 refills | 30.00000 days | Status: AC
Start: 2024-11-04 — End: ?

## 2024-11-04 MED ORDER — MELOXICAM 15 MG TABLET
15 | ORAL_TABLET | Freq: Every day | ORAL | 1 refills | 21.00000 days | Status: AC
Start: 2024-11-04 — End: ?

## 2024-11-04 NOTE — Patient Instructions [37]
 Blood work today. Continue meloxicam  15 mg daily with food. Pantoprazole  40 mg daily on empty stomach (to protect against ulcers from meloxicam ). Will check for rheumatoid arthritis. Suspect this could polymyalgia rheumatica.Patient Education Polymyalgia rheumatica and giant cell arteritis The Basics Written by the doctors and editors at UpToDateWhat are polymyalgia rheumatica and giant cell arteritis? Polymyalgia rheumatica is a condition that can cause stiffness and aching in the shoulders, neck, and hips. It can also cause swelling in joints such as the knees, hands, and feet. It usually occurs in people age 66 and older.Giant cell arteritis is a condition that can cause headaches, trouble seeing, and jaw or arm pain. It affects blood vessels, usually in the head and sometimes in the neck. It does not affect blood vessels in the brain. Giant cell arteritis is sometimes called temporal arteritis because it often affects a blood vessel in the temple on the side of the head.Polymyalgia rheumatica and giant cell arteritis are 2 different conditions. But they sometimes occur together. Usually, they last between 1 and 3 years and then get better. In some people, symptoms improve for a while with treatment, then come back.What are the symptoms of polymyalgia rheumatica and giant cell arteritis? The most common symptoms of polymyalgia rheumatica are: Pain and stiffness in the shoulders, hips, neck, or upper part of the body - These symptoms are usually worse in the morning. They affect both sides of the body and last 30 minutes or longer after getting up. Swelling and stiffness in the knees, hands, wrists, ankles, or feet Feeling tired Weight loss FeverThe most common symptoms of giant cell arteritis are: Headaches - The pain is often over the temples (sides of the forehead) but can also be in the front or back of the head. Some people also have pain in the scalp when it is touched. Pain in the jaw, especially after chewing or talking Pain or weakness in the arm, especially when moving the arm Sudden trouble seeing clearly, or trouble seeing out of 1 eye A new cough or sore throatOther symptoms of giant cell arteritis can include: Fever Feeling tired Weight lossSome people have polymyalgia rheumatica and giant cell arteritis at the same time.Are there tests for polymyalgia rheumatica and giant cell arteritis? Yes. If the doctor or nurse suspects either of these conditions, they will do an exam and order blood tests. They might also order an ultrasound, MRI, or other imaging test. These create pictures of the inside of the body.To check for giant cell arteritis, the doctor might order a test called a biopsy. For this, they take a small sample of a blood vessel from under the skin on the side of the head. Then, they look at the samples under a microscope. Sometimes, ultrasound is used instead of biopsy.How are polymyalgia rheumatica and giant cell arteritis treated? Both are treated with steroid medicines. They reduce swelling and ease pain. Many people feel better after taking their first dose. But most people need to take them for 1 to 2 years.Steroids can have side effects, so your doctor will want you to be on the lowest dose possible for the shortest amount of time possible.Doctors might also prescribe another type of medicine for people with giant cell arteritis. It comes as pills or shots. Some people give themselves the shots at home.What else can I do? Follow all your doctor's instructions about treatments and follow-up. Your doctor will want to see you regularly to see how treatment is working.It's also important to tell  your doctor if you have side effects from the treatment or if you develop new symptoms.All topics are updated as new evidence becomes available and our peer review process is complete.This topic retrieved from UpToDate on: Feb 24, 2024.Topic 15532 Version 13.0Release: 33.2.2 - C33.115? 2025 UpToDate, Inc. and/or its affiliates. All rights reserved.Engineer, Mining: This generalized information is a limited summary of diagnosis, treatment, and/or medication information. It is not meant to be comprehensive and should be used as a tool to help the user understand and/or assess potential diagnostic and treatment options. It does NOT include all information about conditions, treatments, medications, side effects, or risks that may apply to a specific patient. It is not intended to be medical advice or a substitute for the medical advice, diagnosis, or treatment of a health care provider based on the health care provider's examination and assessment of a patient's specific and unique circumstances. Patients must speak with a health care provider for complete information about their health, medical questions, and treatment options, including any risks or benefits regarding use of medications. This information does not endorse any treatments or medications as safe, effective, or approved for treating a specific patient. UpToDate, Inc. and its affiliates disclaim any warranty or liability relating to this information or the use thereof.The use of this information is governed by the Terms of Use, available at https://www.wolterskluwer.com/en/know/clinical-effectiveness-terms. 2025? UpToDate, Inc. and its affiliates and/or licensors. All rights reserved.Copyright ? 2025 UpToDate, Inc. and/or its affiliates. All rights reserved.

## 2024-11-04 NOTE — Progress Notes [1]
 Rheumatology Initial Consult Note01/03/2025 Autumn [name] is a 64 y.o. female who is referred by Fairy Cargo, MD, for consultation with rheumatology. Reason:  Shoulder and wrist pains and elevated inflammatory markers (CRP 41, ESR 42 on 10/20/2024)Subjective  History of Present IllnessDonna Navina [name] is a 64 year old female who presents with joint pain and stiffness. 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 In June/July she had recurrent pain around [facility]/volar aspect of her radial wrist, and saw Dr. Silverio for injections. She has a history of left shoulder pain that began around October or November 2025, which improved with exercises. She had neck pain in Oct 2025 which improved with PT. In December, she developed severe right shoulder pain, which was sudden and made it difficult to lift her arm. An x-ray of R shoulder (images not available) was normal except mild AC joint degenerative changes; but her blood work showed elevated CRp and ESR (42). She was initially treated with meloxicam  and later with prednisone  taper (60 mg/d for 3d, then 40 mg/d 3d, then 20 mg/d 4d), which improved her symptoms but caused side effects such as jitteriness and insomnia. After finishing prednisone  about 1 week ago, she felt some return in R shoulder pain and stiffness, along with some stiffness and pain in R buttock area/posterior R hip, which mostly went away when she resumed meloxicam  15 mg/d. Currently only mild stiffness and pain in R shoulder in the morning which goes away after meloxicam . No neck pain, no L shoulder pain or hip pain or stiffness. No joint swelling. No recent infections, vision changes, jaw pain, or rashes.  No dry eyes or dry mouth.  No family history of autoimmune conditions.  No psoriasis.  Does not smoke.Review of Systems (negative except as noted above)RAPID 3 No data to display    Medications:Current Outpatient Medications Medication Sig  ALPRAZolam  Take 1 tablet (0.5 mg total) by mouth 3 (three) times daily as needed for anxiety.  atorvastatin  Take 1 tablet (10 mg total) by mouth daily.  Mag Glycinate 1-2 pills at night.  multivitamin Take 1 capsule by mouth daily.  Fish OiL  Take 1 capsule by mouth daily.  calcium carbonate-vitamin D3 Take by mouth. (Patient not taking: Reported on 11/04/2024)  fluticasone  propionate Use 2 sprays in each nostril daily. (Patient not taking: Reported on 11/04/2024)  meclizine  Take 1 tablet (12.5 mg total) by mouth 3 (three) times daily as needed for dizziness. (Patient not taking: Reported on 11/04/2024)  meloxicam  Take 1 tablet (15 mg total) by mouth daily.  pantoprazole  Take 1 tablet (40 mg total) by mouth daily. No current facility-administered medications for this visit. Allergies: Kiwi and Sulfa (sulfonamide antibiotics) Past Medical History: Autumn [name] has a past medical history of Abnormal ultrasound, Allergy, Diverticulosis, Hemorrhoids, internal, Hyperlipidemia, Restless legs, and Tubular adenoma (09/14/2022). Family history: as aboveSocial History: as aboveObjective  Physical Exam:BP 126/80 (Site: r a, Position: Sitting, Cuff Size: Small)  - Wt 59.2 kg  - BMI 24.28 kg/m? General: Patient appears well, in no acute distressHEENT: Conjunctivae normal, buccal mucosa normal without lesions Neck:  no lymphadenopathyRespiratory: Clear to auscultation bilaterallyCVS: Regular rate and rhythm, S1, S2 normal, no MRG.MSK:  Scars right wrist after prior surgery.  No significant synovitis.  Squaring of the left 1st CMC.  No synovitis in hands.  Shoulders with full range of motion, no tenderness.  No cervical spine tenderness  with near full range of motion.  Hips without pain on flexion abduction external rotation.Skin: No rashes or lesionsPsychiatric: Mood/affect appropriate, well-kempt Labs and Imaging:Lab Results Component Value Date  WBC 7.6 08/18/2024  HGB 13.3 08/18/2024  HCT 43.10 08/18/2024  MCV 92.1 08/18/2024  MCH 28.4 08/18/2024  MCHC 30.9 (L) 08/18/2024  PLT 329 08/18/2024  MPV 10.9 08/18/2024  NEUTROPHILS 70.6 08/18/2024  LYMPHOCYTES 19.9 08/18/2024  MONOCYTES 7.8 08/18/2024  EOSINOPHILS 0.9 08/18/2024  ANCANC 5.35 08/18/2024  LYMPHABS 1.51 08/18/2024  Lab Results Component Value Date  NA 140 08/18/2024  K 3.6 08/18/2024  CL 104 08/18/2024  CO2 23 08/18/2024  GLU 102 (H) 08/18/2024  BUN 13 08/18/2024  CREATININE 0.52 08/18/2024  EGFRAFRAMER >60 06/09/2020  CALCIUM 9.5 08/18/2024  ALBUMIN 4.4 08/18/2024  PROT 7.2 08/18/2024  BILITOT 0.7 08/18/2024  ALKPHOS 76 08/18/2024  ALT 16 08/18/2024  AST 15 08/18/2024  GLOB 2.8 08/18/2024  Lab Results Component Value Date  HSCRP 41.8 (H) 10/20/2024  SEDRATE 42 (H) 10/20/2024  SEDRATE 28 (H) 08/18/2024 10/20/2024 x-ray right shoulder COMPARISON: None. TECHNIQUE: Two views of the RIGHT shoulder. FINDINGS: Bones:  No discrete acute or suspicious osseous abnormalities.   Joints:  Minimal degenerative changes of RIGHT acromioclavicular joint..  Soft tissues:  Unremarkable.    Additional Findings: None.  IMPRESSION:  No discrete acute osseous abnormalities. 08/08/2024 x-ray cervical spine AP and lateral Mild. 1 anterolisthesis of C4 on C5, age indeterminate.  Moderate to advanced multilevel degenerative disc disease, worse at C5-C6 and C6-C7.06/27/2024 DEXA bone density: FINDINGS:Bone densitometry was performed on the left hip and on the lumbar spine (L1-L4). Bone mineral density (BMD), T and Z scores were calculated for the femur and for the lumbar vertebrae as mentioned. These scores are summarized as follows: Femoral neck: BMD 0.681, T-score -1.5, Z-score 0.0. Total hip: BMD 0.828, T-score -0.9, Z-score 0.3.Lumbar spine: BMD 1.048, T-score 0.0, Z-score 1.8.  FRAX (10-year Fracture Risk):Major Osteoporotic Fracture: 9.1%Hip Fracture: 1.0% IMPRESSION:These scores indicate the bone mineral density is normal.Immunization History Administered Date(s) Administered  COVID-19, MODERNA 12Y+, 0.5 mL 11/01/2019, 11/29/2019, 10/06/2020  Influenza, MDCK, trivalent, injectable, preservative free 09/03/2023  Influenza, injectable, quadrivalent, preservative free 09/03/2017, 09/04/2019, 08/31/2020, 08/25/2021, 08/30/2022  Influenza, seasonal trivalent, (37Yr+) adjuvanted, preservative free 09/05/2024  Influenza, trivalent, injectable, contains preservative 09/06/2012  Pneumococcal conjugate 20 valent (PCV20) 04/22/2024  Tdap 02/12/2013, 04/19/2023  ZOSTER RECOMBINANT (Shingrix) 04/09/2019, 01/19/2020     No data to display    Assessment & Plan65 year old female with history of 1st CMC OA status post surgery many years ago, cervical spine degenerative disease, with recent recurrence of pains in the right wrist in the area of prior surgery status post steroid injections, subsequently with episode of pain in her left shoulder and November 2025 which resolved, pain in her cervical spine in October 2025 which resolved after PT, and more recent episode of severe right shoulder pain and stiffness mostly in the morning that occurred in mid December acutely, and was associated with limited range of motion in the right shoulder as well as elevated ESR and CRP (both 42), on October 20, 2024, with symptoms improved with prednisone  (experienced insomnia and jitteriness), and no more less controlled on meloxicam  15 mg daily with mild stiffness and pain in the right shoulder in the morning which goes away throughout the day.  Differential diagnosis includes polymyalgia rheumatica with atypical features (symmetric), rheumatoid arthritis (RF negative), systemic connective tissue disease (although no other symptoms on review), postviral arthritis (no preceding infections), or crystalline  arthritis (such as pseudogout -- no calcifications reported on shoulder x-ray but images are not available for review).  Labs as below: - Continue meloxicam  15 mg daily with food.- Prescribed pantoprazole  40 mg daily on an empty stomach while on meloxicam - Provided information on polymyalgia rheumatica and giant cell arteritis symptoms to monitor.- Scheduled follow-up in 2 weeks to reassess symptoms and test results.Orders Placed This Encounter Procedures  Cyclic citrul peptide antibody, IgG  Mutated citrullinated vimentin (MCV) antibody (BH GH LMW Q YH)  ANA by IFA w/rflx to dsDNA, RNP, SM, SSA, and SSB Abs   (LMW YH)  C-reactive protein (CRP)  Sedimentation rate (ESR)  CBC and differential  Comprehensive Metabolic Panel Autumn [name] provided a concise overview of the ambient note generation solution. Arland Earnie Molt or their legally authorized representative verbally consented to a temporary audio recording of their visit to assist with completing the visit documentation using an AI-powered solution. This note was reviewed for accuracy by Cheron Plants who performed the clinical service.

## 2024-11-04 NOTE — Telephone Encounter [36]
 Copied from CRM #[id number]. Topic: [facility] CRM - General Inquiry>> Nov 04, 2024  4:53 PM Elfredia F wrote:[name], Autumn MARIE called regarding after summary.Patient called stating she just left the office'She would like after visit summary mailed to her.Contact [phone]

## 2024-11-05 ENCOUNTER — Encounter: Admit: 2024-11-05 | Payer: PRIVATE HEALTH INSURANCE | Attending: Internal Medicine | Primary: Internal Medicine

## 2024-11-05 LAB — ANA BY IFA W/RFLX TO DSDNA, RNP, SM, SSA, AND SSB ABS     (LMW YH): BKR ANTI-NUCLEAR ANTIBODY (ANA): <1:80 {titer}

## 2024-11-05 NOTE — Telephone Encounter [36]
 Patient advised where to view the AVS in her mychart account

## 2024-11-06 ENCOUNTER — Ambulatory Visit: Admit: 2024-11-06 | Payer: PRIVATE HEALTH INSURANCE | Attending: Internal Medicine | Primary: Internal Medicine

## 2024-11-06 ENCOUNTER — Encounter: Admit: 2024-11-06 | Payer: PRIVATE HEALTH INSURANCE | Attending: Internal Medicine | Primary: Internal Medicine

## 2024-11-06 VITALS — BP 112/74 | HR 79 | Ht 61.0 in | Wt 128.0 lb

## 2024-11-06 DIAGNOSIS — Z0000 Encounter for general adult medical examination without abnormal findings: Principal | ICD-10-CM

## 2024-11-06 DIAGNOSIS — R739 Hyperglycemia, unspecified: Secondary | ICD-10-CM

## 2024-11-06 DIAGNOSIS — R9389 Abnormal findings on diagnostic imaging of other specified body structures: Secondary | ICD-10-CM

## 2024-11-06 DIAGNOSIS — M2550 Pain in unspecified joint: Secondary | ICD-10-CM

## 2024-11-06 DIAGNOSIS — T7840XA Allergy, unspecified, initial encounter: Secondary | ICD-10-CM

## 2024-11-06 DIAGNOSIS — K5790 Diverticulosis of intestine, part unspecified, without perforation or abscess without bleeding: Secondary | ICD-10-CM

## 2024-11-06 DIAGNOSIS — G2581 Restless legs syndrome: Principal | ICD-10-CM

## 2024-11-06 DIAGNOSIS — K648 Other hemorrhoids: Secondary | ICD-10-CM

## 2024-11-06 DIAGNOSIS — M25511 Pain in right shoulder: Secondary | ICD-10-CM

## 2024-11-06 DIAGNOSIS — D369 Benign neoplasm, unspecified site: Secondary | ICD-10-CM

## 2024-11-06 DIAGNOSIS — E785 Hyperlipidemia, unspecified: Secondary | ICD-10-CM

## 2024-11-06 LAB — LIPID PANEL
BKR CHOLESTEROL/HDL RATIO: 3.4 (ref 0.0–5.0)
BKR CHOLESTEROL: 179 mg/dL
BKR HDL CHOLESTEROL: 52 mg/dL (ref >=40)
BKR LDL CHOLESTEROL SAMPSON CALCULATED: 100 mg/dL — ABNORMAL HIGH
BKR TRIGLYCERIDES: 157 mg/dL — ABNORMAL HIGH

## 2024-11-06 LAB — CYCLIC CITRUL PEPTIDE ANTIBODY, IGG: BKR CCP AB, IGG: 1.1 {ELISA'U} (ref <7.0)

## 2024-11-06 LAB — HEMOGLOBIN A1C
BKR ESTIMATED AVERAGE GLUCOSE: 123 mg/dL
BKR HEMOGLOBIN A1C: 5.9 % — ABNORMAL HIGH (ref 4.0–5.6)

## 2024-11-06 NOTE — Progress Notes [1]
 Autumn [name] is a 64 y.o. female who presents for her annual preventative visit.  Acute Problems: initial new medicare and cpeChronic problems and risk factors were reviewed:Patient Active Problem List Diagnosis  Hyperlipidemia  Colon adenoma  Abnormal ultrasound  Reviewed and updated medications and allergies: Medication List    Accurate as of November 06, 2024  7:54 AM. If you have any questions, ask your nurse or doctor.    CONTINUE taking these medications  ALPRAZolam  0.5 mg tabletCommonly known as: XANAXTake 1 tablet (0.5 mg total) by mouth 3 (three) times daily as needed for anxiety. atorvastatin  10 mg tabletCommonly known as: LIPITORTake 1 tablet (10 mg total) by mouth daily. Fish OiL  340-1,000 mg per capsuleGeneric drug: omega-3 fatty acids-fish oilTake 1 capsule by mouth daily. Mag Glycinate 100 mg TabGeneric drug: magnesium glycinate1-2 pills at night. meloxicam  15 mg tabletCommonly known as: MOBICTake 1 tablet (15 mg total) by mouth daily. multivitamin capsule pantoprazole  40 mg tabletCommonly known as: PROTONIXTake 1 tablet (40 mg total) by mouth daily.  STOP taking these medications  calcium carbonate-vitamin D3 600 mg-20 mcg (800 unit) TabStopped by: Fairy Cargo, MD fluticasone  propionate 50 mcg/actuation nasal sprayCommonly known as: FLONASEStopped by: Fairy Cargo, MD meclizine  12.5 mg tabletCommonly known as: ANTIVERTStopped by: Fairy Cargo, MD  Allergies: Kiwi and Sulfa (sulfonamide antibiotics)Physical Activity: Get Up and Go Test: able to perform without assistance.  Functional Ability:Hearing Difficulties: none Vision Difficulties: utd with Dr NgyuenActivities of Daily Living: independent Risk for falling: no recent falls     Safe at home: yes Urinary Incontinence: none Other limitations: none Is there a concern for Cognitive Impairment?  None.  Depression Screening:History of depression: none Negative depression screenReview of Systems:Constitutional: no fevers, chills, or changes in weight and appetiteEyes: no blurry vision.  ENT: no sore throat, trouble swallowing Breast: no masses, pain, or discharge Pulmonary: no shortness of breath, cough Cardiac: no chest pain, palpitations, syncope, or edemaAbdomen: no abdominal pain, nausea, vomiting, diarrheaGenito-urinary: no dysuria, hematuria, or incontinence Skin: no rashes or skin lesionsMusculoskeletal: no back pain or joint aches Neuro: no numbness or weakness, trouble walking or memory loss Psych: no anxiety or depressionPast Medical History[1]			Past Surgical History[2]Social History Tobacco Use  Smoking status: Former  Smokeless tobacco: Never  Tobacco comments:   quit over 30 years Substance Use Topics  Alcohol use: Yes   Comment: occasionally 			Family History Problem Relation Age of Onset  Heart disease Mother   Hypertension Mother   Coronary Artery Disease Mother   High cholesterol Mother   Heart disease Father   Lung cancer Father   Throat cancer Father   Kidney failure Father   Heart disease Brother   Coronary Artery Disease Brother   Heart attack Brother   No Known Problems Son   No Known Problems Son   Breast cancer Neg Hx   Uterine cancer Neg Hx   Ovarian cancer Neg Hx  Physical ExamBP 112/74  - Pulse 79  - Ht 5' 1 (1.549 m)  - Wt 58.1 kg  - SpO2 99%  - BMI 24.19 kg/m? General: well-appearing, well-developed, and in no acute distressEyes: no conjunctival pallor, no scleral icterus.  EOMI.  PERRLA Throat: oropharynx clear.  Moist mucus membranesNeck:  Neck supple.  No carotid bruits Lymph Nodes: no cervical, supraclavicular, or axillary lymphadenopathyCardiac: regular rate and rhythm.  No edema Pulmonary: normal respiratory effort.  Lungs clear to auscultation Abdomen: soft, non-tender, and non-distended.  No hepatosplenomegaly.  Skin: no rashes or skin lesionsNeurologic: normal affect.  Normal  gait Health Maintenance Health Maintenance Topic Date Due  HIV screening  Never done  Covid-19 vaccine series (4 - 2025-26 season) 06/30/2024  Breast cancer screening  08/08/2026  Diabetes screening  11/05/2027  Cervical cancer screening  03/07/2029  Lipid disorder screening  04/15/2029  Colon cancer screening, Colonoscopy  09/19/2029  Tetanus adult (Td q 10,TDAP once)  04/18/2033  RSV Immunization (1 - 1-dose 75+ series) 01/16/2034  Shingles vaccine (Shingrix)  Completed  Pneumococcal Vaccine (50+ years)  Completed  Hepatitis C screening  Completed  Influenza vaccine  Completed  Osteoporosis screening (bone density)  Completed  Meningococcal Vaccine  Aged Out  Meningococcal B Vaccine  Aged Out  Pneumococcal Vaccine (2 - 49 years)  Discontinued Immunization History Administered Date(s) Administered  COVID-19, MODERNA 12Y+, 0.5 mL 11/01/2019, 11/29/2019, 10/06/2020  Influenza, MDCK, trivalent, injectable, preservative free 09/03/2023  Influenza, injectable, quadrivalent, preservative free 09/03/2017, 09/04/2019, 08/31/2020, 08/25/2021, 08/30/2022  Influenza, seasonal trivalent, (83Yr+) adjuvanted, preservative free 09/05/2024  Influenza, trivalent, injectable, contains preservative 09/06/2012  Pneumococcal conjugate 20 valent (PCV20) 04/22/2024  Tdap 02/12/2013, 04/19/2023  ZOSTER RECOMBINANT (Shingrix) 04/09/2019, 01/19/2020  Assessment & Plan                                                                                                   Autumn [name] is a 64 y.o. female who is in good health.Health Maintenance recommended: Colonoscopy - 11/23 - Dr Anastacio - adenoma - 7 yr recallDexa 8/25 normalMammo 10/25Lifestyle/behavior changes discussed/recommended: Continue current healthy lifestyle patterns.1. Routine general medical examination at a health care facility (Primary)- EKG- Lipid panel; Future- Hemoglobin A1c; Future2. Hyperlipidemia, unspecified hyperlipidemia typeCont atorvastatin - EKG- Lipid panel; Future3. Right shoulder pain and arthralgias and increased crpWorking with rheum for evalDoing better after steroids and on mobic  now but not completely better4. HyperglycemiaNcs diet- Hemoglobin A1c; Future5. Abnormal abd u/sRepeat u/s  	Patient Care Team:Saraia Platner, Fairy, MD as PCP - General (Internal Medicine)Latzman, Vaughn RAMAN, MD as Gastroenterologist (Gastroenterology)Sivkin, Eva, MD (Inactive) as Gynecologist (Obstetrics and Gynecology)Kribis, Cheron, MD as Physician (Internal Medicine) New Post Visit Medication List  Follow-Up  1 yr  [1] Past Medical History:Diagnosis Date  Abnormal ultrasound   3/25 prominent pancreatic duct  Allergy   Seasonal  Diverticulosis   Hemorrhoids, internal   Hyperlipidemia   Restless legs   ?  Tubular adenoma 09/14/2022 [2] Past Surgical History:Procedure Laterality Date  b/l bunionectomy    BREAST BIOPSY Right 05/2013  benign  carpal tunnel ; b/l    CESAREAN SECTION    COLONOSCOPY  09/14/2022  81yr recall  repair ? perforated ulcer at birth

## 2024-11-12 ENCOUNTER — Encounter: Admit: 2024-11-12 | Payer: PRIVATE HEALTH INSURANCE | Attending: Internal Medicine | Primary: Internal Medicine

## 2024-11-13 ENCOUNTER — Telehealth: Admit: 2024-11-13 | Payer: PRIVATE HEALTH INSURANCE | Attending: Internal Medicine | Primary: Internal Medicine

## 2024-11-13 LAB — MUTATED CITRULLINATED VIMENTIN (MCV) ANTIBODY (BH GH LMW Q YH): MCV ANTIBODY: 4.5 U/mL (ref <=19.9)

## 2024-11-13 NOTE — Telephone Encounter [36]
 Copied from CRM #[id number]. Topic: [facility] CRM - General Inquiry>> Nov 13, 2024  8:54 AM [name] wrote:[name], Autumn MARIE called regarding ESR still being elevated on 1/6 blood draw.She is asking if she needs to recheck the lab before the next follow up on 1/20 with Dr Gregory.She can be messaged through My chart messaging.

## 2024-11-18 ENCOUNTER — Encounter: Admit: 2024-11-18 | Payer: PRIVATE HEALTH INSURANCE | Attending: Internal Medicine | Primary: Internal Medicine

## 2024-11-18 ENCOUNTER — Inpatient Hospital Stay: Admit: 2024-11-18 | Discharge: 2024-11-18 | Payer: Medicare (Managed Care) | Primary: Internal Medicine

## 2024-11-18 VITALS — BP 147/80 | HR 103 | Ht 61.0 in | Wt 133.0 lb

## 2024-11-18 DIAGNOSIS — M353 Polymyalgia rheumatica: Principal | ICD-10-CM

## 2024-11-18 MED ORDER — PREDNISONE 20 MG TABLET
20 | ORAL_TABLET | Freq: Every day | ORAL | 1 refills | 30.00000 days | Status: AC
Start: 2024-11-18 — End: 2024-11-22

## 2024-11-18 NOTE — Progress Notes [1]
 Rheumatology Note01/20/2026 Reason:  Shoulder and wrist pains and elevated inflammatory markers (CRP 41, ESR 42 on 10/20/2024)Subjective  11/04/24:History of Present Autumn [name] is a 64 year old female who presents with joint pain and stiffness. 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 In June/July she had recurrent pain around 1st [facility] area/volar aspect of her radial wrist, and saw Dr. Silverio for injections. She has a history of left shoulder pain that began around October or November 2025, which improved with exercises. She had neck pain in Oct 2025 which improved with PT. In December, she developed severe right shoulder pain, which was sudden and made it difficult to lift her arm. An x-ray of R shoulder (images not available) was normal except mild AC joint degenerative changes; but her blood work showed elevated CRp and ESR (42). She was initially treated with meloxicam  and later with prednisone  taper (60 mg/d for 3d, then 40 mg/d 3d, then 20 mg/d 4d), which improved her symptoms but caused side effects such as jitteriness and insomnia. After finishing prednisone  about 1 week ago, she felt some return in R shoulder pain and stiffness, along with some stiffness and pain in R buttock area/posterior R hip, which mostly went away when she resumed meloxicam  15 mg/d. Currently only mild stiffness and pain in R shoulder in the morning which goes away after meloxicam . No neck pain, no L shoulder pain or hip pain or stiffness. No joint swelling. No recent infections, vision changes, jaw pain, or rashes.  No dry eyes or dry mouth.  No family history of autoimmune conditions.  No psoriasis.  Does not smoke.11/18/24:For last 2 weeks, on meloxicam , noted worsening morning stiffness now symmetric in both shoulders and neck, wakes her up at night, and stiffness lasts for hours in am. Better in the afternoon.No HA, jaw claudication, vision change, fevers, or weight loss. RAPID 3  11/18/2024   2:00 PM Rapid 3 Assessment Function 0 Pain 7 Patient Global 0 Rapid3 (of 30) 7 Medications:Current Outpatient Medications Medication Sig  ALPRAZolam  Take 1 tablet (0.5 mg total) by mouth 3 (three) times daily as needed for anxiety.  atorvastatin  Take 1 tablet (10 mg total) by mouth daily.  Mag Glycinate 1-2 pills at night.  meloxicam  Take 1 tablet (15 mg total) by mouth daily.  multivitamin Take 1 capsule by mouth daily.  Fish Oil  Take 1 capsule by mouth daily.  pantoprazole  Take 1 tablet (40 mg total) by mouth daily. No current facility-administered medications for this visit. Allergies: Kiwi and Sulfa (sulfonamide antibiotics) Past Medical History: Autumn [name] has a past medical history of Abnormal ultrasound, Allergy, Diverticulosis, Hemorrhoids, internal, Hyperlipidemia, Restless legs, and Tubular adenoma (09/14/2022). Family history: as aboveSocial History: as aboveObjective  Physical Exam:BP (!) 147/80  - Pulse (!) 103  - Ht 5' 1 (1.549 m)  - Wt 60.3 kg  - BMI 25.13 kg/m? General: Patient appears well, in no acute distressHEENT: Conjunctivae normal, buccal mucosa normal without lesions Neck:  no lymphadenopathyMSK:  Scars right wrist after prior surgery.   Squaring of the left 1st CMC.  No synovitis in hands.  Shoulders with full range of motion, no tenderness over joint line but tender over trapezius and upper thoracic spine. No cervical spine tenderness with near full range of motion.  Hips without pain on flexion abduction external rotation.Skin: No rashes or lesionsPsychiatric: Mood/affect appropriate, well-kempt Labs and Imaging:Lab Results Component  Value Date  WBC 7.2 11/04/2024  HGB 12.8 11/04/2024  HCT 42.80 11/04/2024  MCV 92.6 11/04/2024  MCH 27.7 11/04/2024  MCHC 29.9 (L) 11/04/2024  PLT 330 11/04/2024  MPV 10.7 11/04/2024  NEUTROPHILS 63.7 11/04/2024  LYMPHOCYTES 23.5 11/04/2024  MONOCYTES 9.7 11/04/2024  EOSINOPHILS 2.2 11/04/2024  ANCANC 4.61 11/04/2024  LYMPHABS 1.70 11/04/2024  Lab Results Component Value Date  NA 140 11/04/2024  K 4.4 11/04/2024  CL 103 11/04/2024  CO2 26 11/04/2024  GLU 81 11/04/2024  BUN 18 11/04/2024  CREATININE 0.63 11/04/2024  EGFRAFRAMER >60 06/09/2020  CALCIUM 9.7 11/04/2024  ALBUMIN 4.3 11/04/2024  PROT 7.5 11/04/2024  BILITOT 0.4 11/04/2024  ALKPHOS 86 11/04/2024  ALT 22 11/04/2024  AST 19 11/04/2024  GLOB 3.2 11/04/2024  Lab Results Component Value Date  HSCRP 11.5 (H) 11/04/2024  HSCRP 41.8 (H) 10/20/2024  SEDRATE 41 (H) 11/04/2024  SEDRATE 42 (H) 10/20/2024  SEDRATE 28 (H) 08/18/2024 Component    Latest Ref Rng 11/04/2024 CCP Ab, IgG    <7.0 EliA U/mL 1.1  MCV ANTIBODY    <=19.9 U/mL 4.5  ANA    <1:80 (Negative)  <1:80  CRP, High Sensitivity    See comment  mg/L 11.5 (H)  Sedimentation Rate (ESR)    0 - 20 mm/hr 41 (H)  Rheumatoid factor negative on 12/22/202512/22/2025 x-ray right shoulder COMPARISON: None. TECHNIQUE: Two views of the RIGHT shoulder. FINDINGS: Bones:  No discrete acute or suspicious osseous abnormalities.   Joints:  Minimal degenerative changes of RIGHT acromioclavicular joint..  Soft tissues:  Unremarkable.    Additional Findings: None.  IMPRESSION:  No discrete acute osseous abnormalities. 08/08/2024 x-ray cervical spine AP and lateral Mild. 1 anterolisthesis of C4 on C5, age indeterminate.  Moderate to advanced multilevel degenerative disc disease, worse at C5-C6 and C6-C7.06/27/2024 DEXA bone density: FINDINGS:Bone densitometry was performed on the left hip and on the lumbar spine (L1-L4). Bone mineral density (BMD), T and Z scores were calculated for the femur and for the lumbar vertebrae as mentioned. These scores are summarized as follows: Femoral neck: BMD 0.681, T-score -1.5, Z-score 0.0. Total hip: BMD 0.828, T-score -0.9, Z-score 0.3.Lumbar spine: BMD 1.048, T-score 0.0, Z-score 1.8.  FRAX (10-year Fracture Risk):Major Osteoporotic Fracture: 9.1%Hip Fracture: 1.0% IMPRESSION:These scores indicate the bone mineral density is normal.Immunization History Administered Date(s) Administered  COVID-19, MODERNA 12Y+, 0.5 mL 11/01/2019, 11/29/2019, 10/06/2020  Influenza, MDCK, trivalent, injectable, preservative free 09/03/2023  Influenza, injectable, quadrivalent, preservative free 09/03/2017, 09/04/2019, 08/31/2020, 08/25/2021, 08/30/2022  Influenza, seasonal trivalent, (68Yr+) adjuvanted, preservative free 09/05/2024  Influenza, trivalent, injectable, contains preservative 09/06/2012  Pneumococcal conjugate 20 valent (PCV20) 04/22/2024  Tdap 02/12/2013, 04/19/2023  ZOSTER RECOMBINANT (Shingrix) 04/09/2019, 01/19/2020    11/18/2024   2:00 PM RAPID 3 Last 7 Results Function 0 Function 0 Pain 7 Patient Global 0 Rapid 3 Score 7 Interpretation 2.3 (MS) Assessment & Plan52 year old female with history of 1st CMC OA status post surgery many years ago, cervical spine degenerative disease, with recent recurrence of pains in the right wrist in the area of prior surgery status post steroid injections, subsequently with episode of pain in her left shoulder in November 2025 which resolved, pain in her cervical spine in October 2025 which resolved after PT, and more recent episode of severe right shoulder pain and stiffness mostly in the morning that occurred in mid December acutely, and was associated with limited range of motion in the right shoulder as well as elevated ESR and CRP (both 42), on October 20, 2024, with  symptoms improved with prednisone  (experienced insomnia and jitteriness on higher dose, 40 to 60 mg/d) by PCP, and reoccurred after stopping prednisone  and switching to meloxicam . Symptoms are now symmetric in both shoulders and neck, with significant morning stiffness. Differential diagnosis includes polymyalgia rheumatica with atypical features, seronegative RA, or crystalline arthritis (such as CPPD, pseudogout -- no calcifications reported on shoulder x-ray but images are not available for review); as well as less likely PMR mimics such as malignancy.- trial prednisone  20 mg/d for 2 weeks for now, reassess sx next week. If no marked improvement, would pursue additional imaging- XR thoracic spine today- pantoprazole  40 mg daily on an empty stomach - Provided information on polymyalgia rheumatica and giant cell arteritis symptoms to monitor.- reach out with any new sx, in particular GCA sx as discussed.Cheron Plants MD

## 2024-11-18 NOTE — Patient Instructions [37]
 XR thoracic spine todayPrednisone 20 mg daily in the morning with food (treatment for polymyaglia). Stop meloxicam . Reassess in 1 week. Patient Education Polymyalgia rheumatica and giant cell arteritis The Basics Written by the doctors and editors at UpToDateWhat are polymyalgia rheumatica and giant cell arteritis? Polymyalgia rheumatica is a condition that can cause stiffness and aching in the shoulders, neck, and hips. It can also cause swelling in joints such as the knees, hands, and feet. It usually occurs in people age 51 and older.Giant cell arteritis is a condition that can cause headaches, trouble seeing, and jaw or arm pain. It affects blood vessels, usually in the head and sometimes in the neck. It does not affect blood vessels in the brain. Giant cell arteritis is sometimes called temporal arteritis because it often affects a blood vessel in the temple on the side of the head.Polymyalgia rheumatica and giant cell arteritis are 2 different conditions. But they sometimes occur together. Usually, they last between 1 and 3 years and then get better. In some people, symptoms improve for a while with treatment, then come back.What are the symptoms of polymyalgia rheumatica and giant cell arteritis? The most common symptoms of polymyalgia rheumatica are: Pain and stiffness in the shoulders, hips, neck, or upper part of the body - These symptoms are usually worse in the morning. They affect both sides of the body and last 30 minutes or longer after getting up. Swelling and stiffness in the knees, hands, wrists, ankles, or feet Feeling tired Weight loss FeverThe most common symptoms of giant cell arteritis are: Headaches - The pain is often over the temples (sides of the forehead) but can also be in the front or back of the head. Some people also have pain in the scalp when it is touched. Pain in the jaw, especially after chewing or talking Pain or weakness in the arm, especially when moving the arm Sudden trouble seeing clearly, or trouble seeing out of 1 eye A new cough or sore throatOther symptoms of giant cell arteritis can include: Fever Feeling tired Weight lossSome people have polymyalgia rheumatica and giant cell arteritis at the same time.Are there tests for polymyalgia rheumatica and giant cell arteritis? Yes. If the doctor or nurse suspects either of these conditions, they will do an exam and order blood tests. They might also order an ultrasound, MRI, or other imaging test. These create pictures of the inside of the body.To check for giant cell arteritis, the doctor might order a test called a biopsy. For this, they take a small sample of a blood vessel from under the skin on the side of the head. Then, they look at the samples under a microscope. Sometimes, ultrasound is used instead of biopsy.How are polymyalgia rheumatica and giant cell arteritis treated? Both are treated with steroid medicines. They reduce swelling and ease pain. Many people feel better after taking their first dose. But most people need to take them for 1 to 2 years.Steroids can have side effects, so your doctor will want you to be on the lowest dose possible for the shortest amount of time possible.Doctors might also prescribe another type of medicine for people with giant cell arteritis. It comes as pills or shots. Some people give themselves the shots at home.What else can I do? Follow all your doctor's instructions about treatments and follow-up. Your doctor will want to see you regularly to see how treatment is working.It's also important to tell your doctor if you have side effects from the treatment or  if you develop new symptoms.All topics are updated as new evidence becomes available and our peer review process is complete.This topic retrieved from UpToDate on: Feb 24, 2024.Topic 15532 Version 13.0Release: 33.2.2 - C33.115? 2025 UpToDate, Inc. and/or its affiliates. All rights reserved.Engineer, Mining: This generalized information is a limited summary of diagnosis, treatment, and/or medication information. It is not meant to be comprehensive and should be used as a tool to help the user understand and/or assess potential diagnostic and treatment options. It does NOT include all information about conditions, treatments, medications, side effects, or risks that may apply to a specific patient. It is not intended to be medical advice or a substitute for the medical advice, diagnosis, or treatment of a health care provider based on the health care provider's examination and assessment of a patient's specific and unique circumstances. Patients must speak with a health care provider for complete information about their health, medical questions, and treatment options, including any risks or benefits regarding use of medications. This information does not endorse any treatments or medications as safe, effective, or approved for treating a specific patient. UpToDate, Inc. and its affiliates disclaim any warranty or liability relating to this information or the use thereof.The use of this information is governed by the Terms of Use, available at https://www.wolterskluwer.com/en/know/clinical-effectiveness-terms. 2025? UpToDate, Inc. and its affiliates and/or licensors. All rights reserved.Copyright ? 2025 UpToDate, Inc. and/or its affiliates. All rights reserved.

## 2024-11-19 ENCOUNTER — Encounter: Admit: 2024-11-19 | Payer: PRIVATE HEALTH INSURANCE | Attending: Internal Medicine | Primary: Internal Medicine

## 2024-11-21 ENCOUNTER — Telehealth: Admit: 2024-11-21 | Payer: PRIVATE HEALTH INSURANCE | Attending: Internal Medicine | Primary: Internal Medicine

## 2024-11-21 NOTE — Telephone Encounter [36]
 [facility] MESSAGETime of call:   9:13 AMReason for call:   Patient came in on Monday and was taken off Meloxicam  and put on Prednisone . She states that she is on day 4 of the Prednisone , but has not seen any improvements and reports some tiredness, she is also feeling some joint pain that was less present before. She is experiencing no relief in symptoms from the Prednisone . She states that she has been active to try to help with joint pain. She is not experiencing any vision loss or jaw pain, but she is having arm pain.Provider: Dr. Magali clinic visit date:  1/20/26Best telephone number for callback:   [phone] Best time to return call (encourage patient to be available for callback):   Any TimePermission to leave message:  yes   [facility]

## 2024-11-22 ENCOUNTER — Encounter: Admit: 2024-11-22 | Payer: PRIVATE HEALTH INSURANCE | Attending: Internal Medicine | Primary: Internal Medicine

## 2024-11-22 DIAGNOSIS — M542 Cervicalgia: Principal | ICD-10-CM

## 2024-11-22 NOTE — Telephone Encounter [36]
 Tried calling, no response

## 2024-11-24 ENCOUNTER — Encounter: Admit: 2024-11-24 | Payer: PRIVATE HEALTH INSURANCE | Attending: Internal Medicine | Primary: Internal Medicine

## 2024-11-24 DIAGNOSIS — M353 Polymyalgia rheumatica: Principal | ICD-10-CM

## 2024-11-24 MED ORDER — PREDNISONE 5 MG TABLET
5 | ORAL_TABLET | ORAL | 1 refills | 30.00000 days | Status: AC
Start: 2024-11-24 — End: ?

## 2024-11-24 NOTE — Progress Notes [1]
 Autumn Note01/26/2026 Dx:  Shoulder and wrist pains and elevated inflammatory markers (CRP 41, ESR 42 on 10/20/2024), ?PMR.Rx: prednisone  20 mg/d since 11/18/24 (effect only on 4th day, complete resolution of stiffness of the neck/shoulders). Previously partial response to meloxicam  15 mg/dSubjective  11/04/24:History of Present Autumn [name] is a 64 year old female who presents with joint pain and stiffness. 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 In June/July she had recurrent pain around 1st [facility] area/volar aspect of her radial wrist, and saw Dr. Silverio for injections. She has a history of left shoulder pain that began around October or November 2025, which improved with exercises. She had neck pain in Oct 2025 which improved with PT. In December, she developed severe right shoulder pain, which was sudden and made it difficult to lift her arm. An x-ray of R shoulder (images not available) was normal except mild AC joint degenerative changes; but her blood work showed elevated CRp and ESR (42). She was initially treated with meloxicam  and later with prednisone  taper (60 mg/d for 3d, then 40 mg/d 3d, then 20 mg/d 4d), which improved her symptoms but caused side effects such as jitteriness and insomnia. After finishing prednisone  about 1 week ago, she felt some return in R shoulder pain and stiffness, along with some stiffness and pain in R buttock area/posterior R hip, which mostly went away when she resumed meloxicam  15 mg/d. Currently only mild stiffness and pain in R shoulder in the morning which goes away after meloxicam . No neck pain, no L shoulder pain or hip pain or stiffness. No joint swelling. No recent infections, vision changes, jaw pain, or rashes.  No dry eyes or dry mouth.  No family history of autoimmune conditions.  No psoriasis.  Does not smoke.11/18/24:For last 2 weeks, on meloxicam , noted worsening morning stiffness now symmetric in both shoulders and neck, wakes her up at night, and stiffness lasts for hours in am. Better in the afternoon.No HA, jaw claudication, vision change, fevers, or weight loss. 11/24/24: VIDEO TELEHEALTH VISIT: This clinician is part of the telehealth program and is conducting this visit in a currently approved location. For this visit the clinician and patient were present via interactive audio & video telecommunications system that permits real-time communications, via the [employer].Patient's use of the telehealth platform followed consent and acknowledges agreement to permit telehealth for this visit. State patient is located in: [place]The clinician is appropriately licensed in the above state to provide care for this visit. Other individuals present during the telehealth encounter and their role/relation: noneBecause this visit was completed over video, a hands-on physical exam was not performed. Patient/parent or guardian understands and knows to call back if condition changes.She now feels back to normal, no stiffness -- but the effect of starting prednisone  20 mg/d started appear only on day 4 or so (initially she did not feel any better and called our office last week).  Complete resolution of stiffness of the neck/shoulders; and also previously had some morning stiffness/discomfort in the buttock area which resolved. RAPID 3  11/18/2024   2:00 PM Rapid 3 Assessment Function 0 Pain 7 Patient Global 0 Rapid3 (of 30) 7 Medications:Current Outpatient Medications Medication Sig  ALPRAZolam  Take 1 tablet (0.5 mg total) by mouth 3 (three) times daily as needed for anxiety.  atorvastatin  Take 1 tablet (10 mg total) by mouth daily.  Mag Glycinate 1-2 pills  at night.  multivitamin Take 1 capsule by mouth daily.  Fish Oil  Take 1 capsule by mouth daily.  pantoprazole  Take 1 tablet (40 mg total) by mouth daily. No current facility-administered medications for this visit. Allergies: Kiwi and Sulfa (sulfonamide antibiotics) Past Medical History: [name] has a past medical history of Abnormal ultrasound, Allergy, Diverticulosis, Hemorrhoids, internal, Hyperlipidemia, Restless legs, and Tubular adenoma (09/14/2022). Family history: as aboveSocial History: as aboveObjective  Physical Exam:There were no vitals taken for this visit.N/APrior exam:General: Patient appears well, in no acute distressHEENT: Conjunctivae normal, buccal mucosa normal without lesions Neck:  no lymphadenopathyMSK:  Scars right wrist after prior surgery.   Squaring of the left 1st CMC.  No synovitis in hands.  Shoulders with full range of motion, no tenderness over joint line but tender over trapezius and upper thoracic spine. No cervical spine tenderness with near full range of motion.  Hips without pain on flexion abduction external rotation.Skin: No rashes or lesionsPsychiatric: Mood/affect appropriate, well-kemptLabs and Imaging:Lab Results Component Value Date  WBC 7.2 11/04/2024  HGB 12.8 11/04/2024  HCT 42.80 11/04/2024  MCV 92.6 11/04/2024  MCH 27.7 11/04/2024  MCHC 29.9 (L) 11/04/2024  PLT 330 11/04/2024  MPV 10.7 11/04/2024  NEUTROPHILS 63.7 11/04/2024  LYMPHOCYTES 23.5 11/04/2024  MONOCYTES 9.7 11/04/2024  EOSINOPHILS 2.2 11/04/2024  ANCANC 4.61 11/04/2024  LYMPHABS 1.70 11/04/2024  Lab Results Component Value Date  NA 140 11/04/2024  K 4.4 11/04/2024  CL 103 11/04/2024  CO2 26 11/04/2024  GLU 81 11/04/2024  BUN 18 11/04/2024  CREATININE 0.63 11/04/2024  EGFRAFRAMER >60 06/09/2020  CALCIUM 9.7 11/04/2024  ALBUMIN 4.3 11/04/2024  PROT 7.5 11/04/2024  BILITOT 0.4 11/04/2024  ALKPHOS 86 11/04/2024  ALT 22 11/04/2024  AST 19 11/04/2024  GLOB 3.2 11/04/2024  Lab Results Component Value Date  HSCRP 11.5 (H) 11/04/2024  HSCRP 41.8 (H) 10/20/2024  SEDRATE 41 (H) 11/04/2024  SEDRATE 42 (H) 10/20/2024  SEDRATE 28 (H) 08/18/2024 Component    Latest Ref Rng 11/04/2024 CCP Ab, IgG    <7.0 EliA U/mL 1.1  MCV ANTIBODY    <=19.9 U/mL 4.5  ANA    <1:80 (Negative)  <1:80  CRP, High Sensitivity    See comment  mg/L 11.5 (H)  Sedimentation Rate (ESR)    0 - 20 mm/hr 41 (H)  Rheumatoid factor negative on 12/22/202512/22/2025 x-ray right shoulder COMPARISON: None. TECHNIQUE: Two views of the RIGHT shoulder. FINDINGS: Bones:  No discrete acute or suspicious osseous abnormalities.   Joints:  Minimal degenerative changes of RIGHT acromioclavicular joint..  Soft tissues:  Unremarkable.    Additional Findings: None.  IMPRESSION:  No discrete acute osseous abnormalities. 08/08/2024 x-ray cervical spine AP and lateral Mild. 1 anterolisthesis of C4 on C5, age indeterminate.  Moderate to advanced multilevel degenerative disc disease, worse at C5-C6 and C6-C7.06/27/2024 DEXA bone density: FINDINGS:Bone densitometry was performed on the left hip and on the lumbar spine (L1-L4). Bone mineral density (BMD), T and Z scores were calculated for the femur and for the lumbar vertebrae as mentioned. These scores are summarized as follows: Femoral neck: BMD 0.681, T-score -1.5, Z-score 0.0. Total hip: BMD 0.828, T-score -0.9, Z-score 0.3.Lumbar spine: BMD 1.048, T-score 0.0, Z-score 1.8.  FRAX (10-year Fracture Risk):Major Osteoporotic Fracture: 9.1%Hip Fracture: 1.0% IMPRESSION:These scores indicate the bone mineral density is normal.Immunization History Administered Date(s) Administered  COVID-19, MODERNA 12Y+, 0.5 mL 11/01/2019, 11/29/2019, 10/06/2020  Influenza, MDCK, trivalent, injectable, preservative free 09/03/2023  Influenza, injectable, quadrivalent, preservative free 09/03/2017, 09/04/2019, 08/31/2020, 08/25/2021, 08/30/2022  Influenza, seasonal trivalent, (95Yr+) adjuvanted, preservative free 09/05/2024  Influenza, trivalent, injectable, contains preservative 09/06/2012  Pneumococcal conjugate 20 valent (PCV20) 04/22/2024  Tdap 02/12/2013, 04/19/2023  ZOSTER RECOMBINANT (Shingrix) 04/09/2019, 01/19/2020    11/18/2024   2:00 PM RAPID 3 Last 7 Results Function 0 Function 0 Pain 7 Patient Global 0 Rapid 3 Score 7 Interpretation 2.3 (MS) Assessment & Plan77 year old female with history of 1st CMC OA status post surgery many years ago, cervical spine degenerative disease, with recent recurrence of pains in the right wrist in the area of prior surgery status post steroid injections, subsequently with episode of pain in her left shoulder in November 2025 which resolved, pain in her cervical spine in October 2025 which resolved after PT, and more recent episode of severe right shoulder pain and stiffness mostly in the morning that occurred in mid December acutely, and was associated with limited range of motion in the right shoulder as well as elevated ESR and CRP (both 42), on October 20, 2024, with symptoms improved with prednisone  (experienced insomnia and jitteriness on higher dose, 40 to 60 mg/d) by PCP, and reoccurred after stopping prednisone  and switching to meloxicam . Symptoms became symmetric in both shoulders and neck, with significant morning stiffness; and some mild stiffness in buttock areas -- responded to prednisone  20 mg/d since 11/18/24 (sx resolved on day 4, not the typical dramatic response of PMR).Differential diagnosis includes polymyalgia rheumatica with atypical features, seronegative RA, or crystalline arthritis (such as CPPD, pseudogout -- no calcifications reported on shoulder x-ray but images are not available for review); as well as less likely PMR mimics such as malignancy.- trial prednisone  20 mg/d for 2 weeks effective on day 4- repeat CRP/ESR at the end of 2 weeks of prednisone  (later this week)-- if normalized, continue taper by 2.5 mg/d every 2 weeks until on 10 mg/d, then by 1 mg/d every month. Monitor for recurrence of morning stiffness. - pantoprazole  40 mg daily on an empty stomach - vit D -- already on cholecalciferol 2000 units daily and having sufficient dietary calcium intake- discussed polymyalgia rheumatica and giant cell arteritis symptoms to monitor; she has a printout. - reach out with any new sxF/u 2 weeks.Cheron Plants MD

## 2024-11-27 ENCOUNTER — Encounter: Admit: 2024-11-27 | Payer: PRIVATE HEALTH INSURANCE | Primary: Internal Medicine

## 2024-11-27 ENCOUNTER — Inpatient Hospital Stay: Admit: 2024-11-27 | Discharge: 2024-11-28 | Payer: Medicare (Managed Care) | Primary: Internal Medicine

## 2024-11-27 ENCOUNTER — Encounter: Admit: 2024-11-27 | Payer: Medicare (Managed Care) | Primary: Internal Medicine

## 2024-11-27 ENCOUNTER — Encounter: Admit: 2024-11-27 | Payer: PRIVATE HEALTH INSURANCE | Attending: Internal Medicine | Primary: Internal Medicine

## 2024-11-27 DIAGNOSIS — R0989 Other specified symptoms and signs involving the circulatory and respiratory systems: Secondary | ICD-10-CM

## 2024-11-27 DIAGNOSIS — M353 Polymyalgia rheumatica: Secondary | ICD-10-CM

## 2024-11-27 DIAGNOSIS — R059 Cough, unspecified type: Secondary | ICD-10-CM

## 2024-11-27 LAB — C-REACTIVE PROTEIN (CRP) HIGH SENSITIVITY: BKR C-REACTIVE PROTEIN, HIGH SENSITIVITY: 1.3 mg/L

## 2024-11-27 LAB — SEDIMENTATION RATE (ESR): BKR SEDIMENTATION RATE, ERYTHROCYTE: 24 mm/h — ABNORMAL HIGH (ref 0–20)

## 2024-11-27 MED ORDER — PROMETHAZINE-DM 6.25 MG-15 MG/5 ML ORAL SYRUP
6.25-15 | Freq: Four times a day (QID) | ORAL | 1 refills | 5.00000 days | Status: AC | PRN
Start: 2024-11-27 — End: ?

## 2024-11-27 MED ORDER — TURMERIC 400 MG CAPSULE
400 | Freq: Every day | ORAL | 0.00 refills | 30.00000 days | Status: AC
Start: 2024-11-27 — End: ?

## 2024-11-27 NOTE — Progress Notes [1]
 VIDEO TELEHEALTH VISIT: This clinician is part of the telehealth program and is conducting this visit in a currently approved location. For this visit the clinician and patient were present via interactive audio & video telecommunications system that permits real-time communications, via the [employer].Patient's use of the telehealth platform followed consent and acknowledges agreement to permit telehealth for this visit. State patient is located in: [place]The clinician is appropriately licensed in the above state to provide care for this visit. Other individuals present during the telehealth encounter and their role/relation: noneBecause this visit was completed over video, a hands-on physical exam was not performed. Patient/parent or guardian understands and knows to call back if condition changes.HPI: Pt with coughing , chest congestion, fatigue for the past 4 days. No sore throat. Some hoarseness to voice, thinks from coughing so hard.  No fever.  No body aches. No headache or sinus congestion. Taking Robitussin. Not helping much. Has coughing fits. Cough mostly non-productive. No wheezing. No SOB. No chest pain.No GI symptoms other than decreased appetite but is making sure to eat and drink. On Prednisone  20 mg daily for PMR. Allergies[1]Medications Ordered Prior to Encounter[2]Past Medical History[3]Physical Exam:Pt in no acute distressPt speaking normally without labored breathing+ hoarseness to voiceSkin color wnlAssessment/Plan:1. Cough, unspecified type (Primary)2. Chest congestionPt denies fever, body achesPt feels symptoms are chest/bronchial and not sinusCheck CXR- CXR (XR Chest PA and Lateral); FuturePromethazine DM 1 teaspoon every 6 hours prnRestIncrease fluidsIf symptoms persist or worsen t/c antibioticElectronically Signed by Bruno Almarie Bash, APRN, November 27, 2024 [1] AllergiesAllergen Reactions  Kiwi Swelling   lips  Sulfa (Sulfonamide Antibiotics) Rash [2] Current Outpatient Medications on File Prior to Visit Medication Sig Dispense Refill  ALPRAZolam  (XANAX ) 0.5 mg tablet Take 1 tablet (0.5 mg total) by mouth 3 (three) times daily as needed for anxiety. 90 tablet 3  atorvastatin  (LIPITOR) 10 mg tablet Take 1 tablet (10 mg total) by mouth daily. 90 tablet 2  magnesium glycinate (MAG GLYCINATE) 100 mg Tab 1-2 pills at night.    multivitamin capsule Take 1 capsule by mouth daily.    omega-3 fatty acids-fish oil  (FISH OIL ) 340-1,000 mg per capsule Take 1 capsule by mouth daily. 100 capsule 1  pantoprazole  (PROTONIX ) 40 mg tablet Take 1 tablet (40 mg total) by mouth daily. 30 tablet 0  predniSONE  (DELTASONE ) 5 mg tablet Take 3.5 tablets (17.5 mg total) by mouth daily for 14 days, THEN 3 tablets (15 mg total) daily for 14 days, THEN 2.5 tablets (12.5 mg total) daily for 14 days, THEN 2 tablets (10 mg total) daily. Take with food. 154 tablet 0 No current facility-administered medications on file prior to visit. [3] Past Medical History:Diagnosis Date  Abnormal ultrasound   3/25 prominent pancreatic duct  Allergy   Seasonal  Diverticulosis   Hemorrhoids, internal   Hyperlipidemia   Restless legs   ?  Tubular adenoma 09/14/2022

## 2024-11-27 NOTE — Progress Notes [1]
Order faxed to [facility]

## 2024-11-28 ENCOUNTER — Encounter: Admit: 2024-11-28 | Payer: PRIVATE HEALTH INSURANCE | Attending: Internal Medicine | Primary: Internal Medicine

## 2024-11-28 ENCOUNTER — Telehealth: Admit: 2024-11-28 | Payer: PRIVATE HEALTH INSURANCE | Attending: Internal Medicine | Primary: Internal Medicine

## 2024-11-28 NOTE — Telephone Encounter [36]
 Copied from CRM #[id number]. Topic: Medication Question - [facility]>> Nov 28, 2024  8:38 AM Humna Moorehouse O wrote:Patient would like to verify if she is supposed to be on Meloxicam  while on Prednisone  or does she need to completely taper off Prednisone  before she starts the meloxicam  again.

## 2024-11-30 ENCOUNTER — Encounter: Admit: 2024-11-30 | Payer: PRIVATE HEALTH INSURANCE | Attending: Internal Medicine | Primary: Internal Medicine

## 2024-11-30 DIAGNOSIS — E7800 Pure hypercholesterolemia, unspecified: Principal | ICD-10-CM

## 2024-12-01 MED ORDER — ATORVASTATIN 10 MG TABLET
10 | ORAL_TABLET | Freq: Every day | ORAL | 3 refills | 90.00000 days | Status: AC
Start: 2024-12-01 — End: ?

## 2024-12-01 NOTE — Telephone Encounter [36]
 LOV:11/27/2024 WNC:Cpdpu date not found

## 2024-12-02 ENCOUNTER — Encounter: Admit: 2024-12-02 | Payer: PRIVATE HEALTH INSURANCE | Attending: Internal Medicine | Primary: Internal Medicine

## 2024-12-02 DIAGNOSIS — M2550 Pain in unspecified joint: Principal | ICD-10-CM

## 2024-12-15 ENCOUNTER — Encounter: Admit: 2024-12-15 | Payer: PRIVATE HEALTH INSURANCE | Attending: Internal Medicine | Primary: Internal Medicine

## 2025-04-23 ENCOUNTER — Encounter: Admit: 2025-04-23 | Payer: PRIVATE HEALTH INSURANCE | Attending: Internal Medicine | Primary: Internal Medicine
